# Patient Record
Sex: Female | Born: 1990 | Race: Black or African American | Hispanic: No | Marital: Single | Smoking: Former smoker
Health system: Southern US, Community
[De-identification: ages and names within clinical notes are randomized; demographics above are authoritative.]

## PROBLEM LIST (undated history)

## (undated) DIAGNOSIS — R112 Nausea with vomiting, unspecified: Secondary | ICD-10-CM

## (undated) DIAGNOSIS — F1290 Cannabis use, unspecified, uncomplicated: Secondary | ICD-10-CM

## (undated) DIAGNOSIS — R1116 Cannabis hyperemesis syndrome: Secondary | ICD-10-CM

## (undated) DIAGNOSIS — R109 Unspecified abdominal pain: Secondary | ICD-10-CM

## (undated) HISTORY — PX: WISDOM TOOTH EXTRACTION: SHX21

---
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
## 2010-05-02 LAB — BASIC METABOLIC PANEL
Calcium: 8.8 mg/dL (ref 8.4–10.5)
Glucose, Bld: 96 mg/dL (ref 70–99)
Sodium: 142 mEq/L (ref 135–145)

## 2010-05-02 LAB — URINALYSIS, ROUTINE W REFLEX MICROSCOPIC
Glucose, UA: NEGATIVE mg/dL
Ketones, ur: 15 mg/dL — AB
pH: 6 (ref 5.0–8.0)

## 2010-05-02 LAB — URINE MICROSCOPIC-ADD ON

## 2010-05-04 LAB — URINE CULTURE

## 2010-05-04 LAB — URINALYSIS, ROUTINE W REFLEX MICROSCOPIC
Glucose, UA: NEGATIVE mg/dL
Hgb urine dipstick: NEGATIVE
Hgb urine dipstick: NEGATIVE
Ketones, ur: 15 mg/dL — AB
Protein, ur: NEGATIVE mg/dL
Specific Gravity, Urine: 1.029 (ref 1.005–1.030)
Urobilinogen, UA: 0.2 mg/dL (ref 0.0–1.0)

## 2010-05-04 LAB — COMPREHENSIVE METABOLIC PANEL
AST: 57 U/L — ABNORMAL HIGH (ref 0–37)
Albumin: 4.9 g/dL (ref 3.5–5.2)
Calcium: 9 mg/dL (ref 8.4–10.5)
Chloride: 106 mEq/L (ref 96–112)
Creatinine, Ser: 0.8 mg/dL (ref 0.4–1.2)
Total Protein: 9.5 g/dL — ABNORMAL HIGH (ref 6.0–8.3)

## 2010-05-04 LAB — CBC
MCV: 80.9 fL (ref 78.0–98.0)
Platelets: 188 10*3/uL (ref 150–400)
RDW: 12.3 % (ref 11.4–15.5)
WBC: 9.6 10*3/uL (ref 4.5–13.5)

## 2010-05-04 LAB — DIFFERENTIAL
Eosinophils Relative: 0 % (ref 0–5)
Lymphocytes Relative: 9 % — ABNORMAL LOW (ref 24–48)
Lymphs Abs: 0.9 10*3/uL — ABNORMAL LOW (ref 1.1–4.8)
Monocytes Absolute: 0.4 10*3/uL (ref 0.2–1.2)
Monocytes Relative: 4 % (ref 3–11)
Neutro Abs: 8.2 10*3/uL — ABNORMAL HIGH (ref 1.7–8.0)

## 2010-05-04 LAB — URINE MICROSCOPIC-ADD ON

## 2010-05-09 LAB — BASIC METABOLIC PANEL
BUN: 15 mg/dL (ref 6–23)
Chloride: 107 mEq/L (ref 96–112)
Glucose, Bld: 100 mg/dL — ABNORMAL HIGH (ref 70–99)
Potassium: 4.4 mEq/L (ref 3.5–5.1)
Sodium: 140 mEq/L (ref 135–145)

## 2010-05-09 LAB — URINALYSIS, ROUTINE W REFLEX MICROSCOPIC
Bilirubin Urine: NEGATIVE
Nitrite: NEGATIVE
Specific Gravity, Urine: 1.029 (ref 1.005–1.030)
Urobilinogen, UA: 0.2 mg/dL (ref 0.0–1.0)
pH: 5.5 (ref 5.0–8.0)

## 2010-05-09 LAB — PREGNANCY, URINE: Preg Test, Ur: NEGATIVE

## 2010-05-23 ENCOUNTER — Emergency Department (HOSPITAL_BASED_OUTPATIENT_CLINIC_OR_DEPARTMENT_OTHER)
Admission: EM | Admit: 2010-05-23 | Discharge: 2010-05-23 | Disposition: A | Discharge status: Home / Self Care | Payer: Self-pay | Attending: Emergency Medicine | Admitting: Emergency Medicine

## 2010-05-23 DIAGNOSIS — K089 Disorder of teeth and supporting structures, unspecified: Secondary | ICD-10-CM | POA: Insufficient documentation

## 2010-05-25 ENCOUNTER — Emergency Department (HOSPITAL_COMMUNITY)
Admission: EM | Admit: 2010-05-25 | Discharge: 2010-05-25 | Disposition: A | Discharge status: Home / Self Care | Payer: Self-pay | Attending: Emergency Medicine | Admitting: Emergency Medicine

## 2010-05-25 DIAGNOSIS — K089 Disorder of teeth and supporting structures, unspecified: Secondary | ICD-10-CM | POA: Insufficient documentation

## 2010-05-25 DIAGNOSIS — F329 Major depressive disorder, single episode, unspecified: Secondary | ICD-10-CM | POA: Insufficient documentation

## 2010-05-25 DIAGNOSIS — K029 Dental caries, unspecified: Secondary | ICD-10-CM | POA: Insufficient documentation

## 2010-05-25 DIAGNOSIS — F3289 Other specified depressive episodes: Secondary | ICD-10-CM | POA: Insufficient documentation

## 2010-06-12 ENCOUNTER — Emergency Department (HOSPITAL_BASED_OUTPATIENT_CLINIC_OR_DEPARTMENT_OTHER)
Admission: EM | Admit: 2010-06-12 | Discharge: 2010-06-13 | Disposition: A | Discharge status: Home / Self Care | Payer: Self-pay | Attending: Emergency Medicine | Admitting: Emergency Medicine

## 2010-06-12 ENCOUNTER — Emergency Department (HOSPITAL_BASED_OUTPATIENT_CLINIC_OR_DEPARTMENT_OTHER)
Admission: EM | Admit: 2010-06-12 | Discharge: 2010-06-12 | Disposition: A | Discharge status: Home / Self Care | Payer: Self-pay | Attending: Emergency Medicine | Admitting: Emergency Medicine

## 2010-06-12 ENCOUNTER — Emergency Department (INDEPENDENT_AMBULATORY_CARE_PROVIDER_SITE_OTHER): Payer: Self-pay

## 2010-06-12 DIAGNOSIS — J3489 Other specified disorders of nose and nasal sinuses: Secondary | ICD-10-CM | POA: Insufficient documentation

## 2010-06-12 DIAGNOSIS — J189 Pneumonia, unspecified organism: Secondary | ICD-10-CM | POA: Insufficient documentation

## 2010-06-12 DIAGNOSIS — J069 Acute upper respiratory infection, unspecified: Secondary | ICD-10-CM | POA: Insufficient documentation

## 2010-06-12 DIAGNOSIS — R0789 Other chest pain: Secondary | ICD-10-CM

## 2010-06-12 DIAGNOSIS — R05 Cough: Secondary | ICD-10-CM

## 2010-06-12 LAB — RAPID STREP SCREEN (MED CTR MEBANE ONLY): Streptococcus, Group A Screen (Direct): NEGATIVE

## 2010-10-13 LAB — INFLUENZA A AND B ANTIGEN (CONVERTED LAB): Inflenza A Ag: NEGATIVE

## 2011-02-05 ENCOUNTER — Encounter (HOSPITAL_COMMUNITY): Payer: Self-pay | Admitting: Adult Health

## 2011-02-05 ENCOUNTER — Emergency Department (HOSPITAL_COMMUNITY)
Admission: EM | Admit: 2011-02-05 | Discharge: 2011-02-05 | Disposition: A | Discharge status: Home / Self Care | Payer: Self-pay | Attending: Emergency Medicine | Admitting: Emergency Medicine

## 2011-02-05 DIAGNOSIS — K089 Disorder of teeth and supporting structures, unspecified: Secondary | ICD-10-CM | POA: Insufficient documentation

## 2011-02-05 DIAGNOSIS — K047 Periapical abscess without sinus: Secondary | ICD-10-CM | POA: Insufficient documentation

## 2011-02-05 MED ORDER — PENICILLIN V POTASSIUM 500 MG PO TABS: 500.0000 mg | ORAL_TABLET | Freq: Four times a day (QID) | ORAL | Status: AC

## 2011-02-05 MED ORDER — HYDROCODONE-ACETAMINOPHEN 5-325 MG PO TABS: 1.0000 | ORAL_TABLET | ORAL | Status: AC | PRN

## 2011-02-05 NOTE — ED Notes (Signed)
D/c completed by A.Dennis,RN 

## 2011-02-05 NOTE — ED Notes (Signed)
C/o toothache that began one week ago and has gotten worse. Dental caries noted to upper right jaw.

## 2011-02-05 NOTE — ED Provider Notes (Signed)
History     CSN: [id number]  Arrival date & time 02/05/11  1712   First MD Initiated Contact with Patient 02/05/11 2057      Chief Complaint  Patient presents with  . Dental Pain    (Consider location/radiation/quality/duration/timing/severity/associated sxs/prior treatment) Patient is a 21 y.o. female presenting with tooth pain. The history is provided by the patient.  Dental PainThe primary symptoms include mouth pain and oral bleeding. Primary symptoms do not include dental injury, oral lesions, headaches, fever, shortness of breath, sore throat, angioedema or cough. The symptoms began 5 to 7 days ago. The symptoms are worsening. The symptoms are new. The symptoms occur constantly.  Oral bleeding began 24 - 48 hours ago. Progression of oral bleeding: resolved. The bleeding is new. Location of the bleeding: gum(s). The bleeding is associated with brushing teeth.  Additional symptoms include: dental sensitivity to temperature and gum tenderness. Additional symptoms do not include: purulent gums, trismus, facial swelling, trouble swallowing, drooling, ear pain, hearing loss and swollen glands. Medical issues include: smoking.    History reviewed. No pertinent past medical history.  History reviewed. No pertinent past surgical history.  History reviewed. No pertinent family history.  History  Substance Use Topics  . Smoking status: Current Everyday Smoker  . Smokeless tobacco: Not on file  . Alcohol Use: No     Review of Systems  Constitutional: Negative for fever and chills.  HENT: Positive for dental problem. Negative for hearing loss, ear pain, sore throat, facial swelling, drooling and trouble swallowing.   Eyes: Negative for pain and visual disturbance.  Respiratory: Negative for cough and shortness of breath.   Gastrointestinal: Negative for nausea, vomiting and abdominal pain.  Neurological: Negative for dizziness, light-headedness and headaches.    Allergies    Ibuprofen and Zofran  Home Medications  No current outpatient prescriptions on file.  BP 125/69  Pulse 74  Temp(Src) 98.6 F (37 C) (Oral)  Resp 20  SpO2 100%  Physical Exam  Nursing note and vitals reviewed. Constitutional: [name] oriented to person, place, and time. She appears well-developed and well-nourished.       Uncomfortable appearing  HENT:  Head: Normocephalic and atraumatic. No trismus in the jaw.  Right Ear: External ear normal.  Left Ear: External ear normal.  Nose: Nose normal.  Mouth/Throat: No oral lesions. Dental caries present.         Erosion to multiple teeth with significant plaque  Eyes: Conjunctivae are normal. Pupils are equal, round, and reactive to light.  Neck: Normal range of motion. Neck supple.  Cardiovascular: Normal rate and regular rhythm.   Pulmonary/Chest: Effort normal. No respiratory distress.  Abdominal: Soft. She exhibits no distension. There is no tenderness.  Musculoskeletal: She exhibits no edema and no tenderness.  Lymphadenopathy:    She has no cervical adenopathy.  Neurological: [name] alert and oriented to person, place, and time. No cranial nerve deficit.  Skin: Skin is warm and dry. No erythema.  Psychiatric: She has a normal mood and affect.    ED Course  Procedures (including critical care time)  Labs Reviewed - No data to display No results found.  Dx 1: Dental abscess   MDM  Suspect dental abscess. Will give rx for PCN and pain medication- advised close f/u with dentist and pt voices understanding of need for follow-up.        Elwyn Reach [place], [place] 02/05/11 2133

## 2011-02-06 ENCOUNTER — Encounter (HOSPITAL_COMMUNITY): Payer: Self-pay | Admitting: *Deleted

## 2011-02-06 ENCOUNTER — Emergency Department (HOSPITAL_COMMUNITY)
Admission: EM | Admit: 2011-02-06 | Discharge: 2011-02-07 | Disposition: A | Discharge status: Home / Self Care | Payer: Self-pay | Attending: Emergency Medicine | Admitting: Emergency Medicine

## 2011-02-06 DIAGNOSIS — N898 Other specified noninflammatory disorders of vagina: Secondary | ICD-10-CM | POA: Insufficient documentation

## 2011-02-06 DIAGNOSIS — B86 Scabies: Secondary | ICD-10-CM | POA: Insufficient documentation

## 2011-02-06 LAB — URINALYSIS, ROUTINE W REFLEX MICROSCOPIC
Hgb urine dipstick: NEGATIVE
Nitrite: NEGATIVE
Specific Gravity, Urine: 1.024 (ref 1.005–1.030)
Urobilinogen, UA: 1 mg/dL (ref 0.0–1.0)

## 2011-02-06 LAB — WET PREP, GENITAL
Clue Cells Wet Prep HPF POC: NONE SEEN
WBC, Wet Prep HPF POC: NONE SEEN

## 2011-02-06 LAB — URINE MICROSCOPIC-ADD ON

## 2011-02-06 MED ORDER — PERMETHRIN 5 % EX CREA: TOPICAL_CREAM | CUTANEOUS | Status: AC

## 2011-02-06 NOTE — ED Notes (Signed)
Pt in stating she thinks she has scabies, boyfriend was dx with this one month ago and pt never got treated, also wants to be evaluated for STD's, c/o brown vaginal discharge, recently treated for another STD but she doesn't remember which one, also wants a pregnancy test

## 2011-02-06 NOTE — ED Provider Notes (Signed)
Medical screening examination/treatment/procedure(s) were performed by non-physician practitioner and as supervising physician I was immediately available for consultation/collaboration.   Hellena Pridgen M Zacchaeus Halm, DO 02/06/11 1306 

## 2011-02-06 NOTE — ED Notes (Signed)
PA at bedside.

## 2011-02-06 NOTE — ED Provider Notes (Signed)
History     CSN: [id number]  Arrival date & time 02/06/11  1804   First MD Initiated Contact with Patient 02/06/11 1949      Chief Complaint  Patient presents with  . SEXUALLY TRANSMITTED DISEASE  . Rash    (Consider location/radiation/quality/duration/timing/severity/associated sxs/prior treatment) HPI Comments: Patient here after her boyfriend reported burning with urination and burning and irritation from his penis - she states she had a brown white discharge but none now - denies abdominal pain, nausea, vomiting, urinary symptoms - also wants to be treated for scabies.  Patient is a 21 y.o. female presenting with rash and vaginal discharge. The history is provided by the patient. No language interpreter was used.  Rash  This is a new problem. The current episode started more than 1 week ago. The problem has not changed since onset.Associated with: contact with boyfriend who was recently diagnosed with scabies. There has been no fever. The rash is present on the torso, back, abdomen and groin. The pain is at a severity of 0/10. The patient is experiencing no pain. The pain has been constant since onset. Associated symptoms include itching. Pertinent negatives include no pain and no weeping. She has tried nothing for the symptoms. The treatment provided no relief.  Vaginal Discharge This is a new problem. The current episode started in the past 7 days. The problem occurs intermittently. The problem has been unchanged. Associated symptoms include a rash. Pertinent negatives include no abdominal pain, anorexia, chest pain, congestion, coughing, fatigue, fever, headaches, joint swelling, nausea, sore throat, swollen glands, urinary symptoms, vomiting or weakness. The symptoms are aggravated by nothing. She has tried nothing for the symptoms. The treatment provided no relief.    History reviewed. No pertinent past medical history.  History reviewed. No pertinent past surgical  history.  History reviewed. No pertinent family history.  History  Substance Use Topics  . Smoking status: Current Everyday Smoker  . Smokeless tobacco: Not on file  . Alcohol Use: No    OB History    Grav Para Term Preterm Abortions TAB SAB Ect Mult Living                  Review of Systems  Constitutional: Negative for fever and fatigue.  HENT: Negative for congestion and sore throat.   Respiratory: Negative for cough.   Cardiovascular: Negative for chest pain.  Gastrointestinal: Negative for nausea, vomiting, abdominal pain and anorexia.  Genitourinary: Positive for vaginal discharge.  Musculoskeletal: Negative for joint swelling.  Skin: Positive for itching and rash.  Neurological: Negative for weakness and headaches.  All other systems reviewed and are negative.    Allergies  Ibuprofen and Zofran  Home Medications   Current Outpatient Rx  Name Route Sig Dispense Refill  . HYDROCODONE-ACETAMINOPHEN 5-325 MG PO TABS Oral Take 1-2 tablets by mouth every 4 (four) hours as needed for pain. 15 tablet 0  . PENICILLIN V POTASSIUM 500 MG PO TABS Oral Take 1 tablet (500 mg total) by mouth 4 (four) times daily. 40 tablet 0    BP 97/48  Pulse 69  Temp(Src) 98.3 F (36.8 C) (Oral)  Resp 18  SpO2 100%  Physical Exam  Nursing note and vitals reviewed. Constitutional: [name] oriented to person, place, and time. She appears well-developed and well-nourished. No distress.  HENT:  Head: Normocephalic and atraumatic.  Right Ear: External ear normal.  Left Ear: External ear normal.  Nose: Nose normal.  Mouth/Throat: Oropharynx is clear and moist.  No oropharyngeal exudate.  Eyes: Conjunctivae are normal. Pupils are equal, round, and reactive to light. No scleral icterus.  Neck: Normal range of motion. Neck supple.  Cardiovascular: Normal rate, regular rhythm and normal heart sounds.  Exam reveals no gallop and no friction rub.   No murmur heard. Pulmonary/Chest: Effort  normal and breath sounds normal. She exhibits no tenderness.  Abdominal: Soft. Bowel sounds are normal. She exhibits no distension. There is no tenderness.  Genitourinary: Uterus normal. There is no rash or tenderness on the right labia. There is no rash or tenderness on the left labia. Uterus is not enlarged and not tender. Cervix exhibits no motion tenderness, no discharge and no friability. Right adnexum displays no mass and no tenderness. Left adnexum displays no mass and no tenderness. No bleeding around the vagina. No vaginal discharge found.  Musculoskeletal: Normal range of motion.  Lymphadenopathy:    She has no cervical adenopathy.  Neurological: [name] alert and oriented to person, place, and time. No cranial nerve deficit.  Skin: Rash noted.       Diffuse papular rash to trunk, abdomen, groin and back  Psychiatric: She has a normal mood and affect. Her behavior is normal. Judgment and thought content normal.    ED Course  Procedures (including critical care time)  Labs Reviewed  URINALYSIS, ROUTINE W REFLEX MICROSCOPIC - Abnormal; Notable for the following:    APPearance CLOUDY (*)    Leukocytes, UA SMALL (*)    All other components within normal limits  URINE MICROSCOPIC-ADD ON - Abnormal; Notable for the following:    Squamous Epithelial / LPF MANY (*)    Bacteria, UA MANY (*)    All other components within normal limits  WET PREP, GENITAL  PREGNANCY, URINE  GC/CHLAMYDIA PROBE AMP, GENITAL  POCT PREGNANCY, URINE  PREGNANCY, URINE   No results found.   Scabies STD check    MDM  Patient here with history of scabies in close contact so will treat - though boyfriend with dysuria and irritation to penis - there is no evidence clinically of cervicitis - have sent cultures - wet prep negative, non purulent discharge.       Izola Price [place], [place] 02/06/11 2251

## 2011-02-07 LAB — GC/CHLAMYDIA PROBE AMP, GENITAL: GC Probe Amp, Genital: NEGATIVE

## 2011-02-07 NOTE — ED Provider Notes (Signed)
Medical screening examination/treatment/procedure(s) were performed by non-physician practitioner and as supervising physician I was immediately available for consultation/collaboration. Devoria Albe, MD, Armando Gang   Ward Givens, MD 02/07/11 0040

## 2011-02-08 NOTE — ED Notes (Signed)
+   Chlamydia. Patient was not treated appropriately.Chart sent to EDP office for review. [employer] attached.

## 2011-02-09 NOTE — ED Notes (Signed)
Treated with  Doxycyline 100 mg po BID x 7 days per [facility] # 14 need to be called to pharmacy.

## 2011-02-09 NOTE — ED Notes (Signed)
RX call in at [facility] pharmacy on [address]

## 2012-01-31 ENCOUNTER — Emergency Department (HOSPITAL_COMMUNITY)
Admission: EM | Admit: 2012-01-31 | Discharge: 2012-01-31 | Disposition: A | Discharge status: Home / Self Care | Payer: Self-pay | Attending: Emergency Medicine | Admitting: Emergency Medicine

## 2012-01-31 ENCOUNTER — Encounter (HOSPITAL_COMMUNITY): Payer: Self-pay | Admitting: *Deleted

## 2012-01-31 DIAGNOSIS — Y929 Unspecified place or not applicable: Secondary | ICD-10-CM | POA: Insufficient documentation

## 2012-01-31 DIAGNOSIS — Y939 Activity, unspecified: Secondary | ICD-10-CM | POA: Insufficient documentation

## 2012-01-31 DIAGNOSIS — W1809XA Striking against other object with subsequent fall, initial encounter: Secondary | ICD-10-CM | POA: Insufficient documentation

## 2012-01-31 DIAGNOSIS — F17200 Nicotine dependence, unspecified, uncomplicated: Secondary | ICD-10-CM | POA: Insufficient documentation

## 2012-01-31 DIAGNOSIS — S0180XA Unspecified open wound of other part of head, initial encounter: Secondary | ICD-10-CM | POA: Insufficient documentation

## 2012-01-31 DIAGNOSIS — S0181XA Laceration without foreign body of other part of head, initial encounter: Secondary | ICD-10-CM

## 2012-01-31 MED ORDER — ACETAMINOPHEN 500 MG PO TABS: 500.0000 mg | ORAL_TABLET | Freq: Four times a day (QID) | ORAL | Status: DC | PRN

## 2012-01-31 MED ORDER — ACETAMINOPHEN 325 MG PO TABS
650.0000 mg | ORAL_TABLET | Freq: Once | ORAL | Status: AC
  Administered 2012-01-31: 650 mg via ORAL
  Filled 2012-01-31: qty 2

## 2012-01-31 NOTE — ED Notes (Addendum)
Small laceration to right outer eyelid. Pt A&Ox4, ambulatory, nad. Applied ice to site.

## 2012-01-31 NOTE — ED Provider Notes (Signed)
Medical screening examination/treatment/procedure(s) were performed by non-physician practitioner and as supervising physician I was immediately available for consultation/collaboration.  Minsa Weddington R. Briseis Aguilera, MD 01/31/12 2332 

## 2012-01-31 NOTE — ED Provider Notes (Signed)
History     CSN: [id number]  Arrival date & time 01/31/12  2143   First MD Initiated Contact with Patient 01/31/12 2218      Chief Complaint  Patient presents with  . Facial Laceration   HPI  History provided by the patient. Patient is a 22 year old female with no significant PMH who presents with small laceration to right eyebrow. Patient states that she was reaching and sitting at her desk when a drawer in part of the desk broke and fell causing her to hit her right eyebrow area on the desk. She denies LOC. Denies any eye pain or vision change. Patient does have a small laceration over her eyebrow with bleeding that was controlled with pressure from wash cloth. Patient is not using other treatments for symptoms. She denies any other associated symptoms or injury. Pain is described as moderate and throbbing. Patient reports having a tetanus shot within the last 6 months.    History reviewed. No pertinent past medical history.  Past Surgical History  Procedure Date  . Wisdom tooth extraction     History reviewed. No pertinent family history.  History  Substance Use Topics  . Smoking status: Current Every Day Smoker -- 0.5 packs/day  . Smokeless tobacco: Not on file  . Alcohol Use: No    OB History    Grav Para Term Preterm Abortions TAB SAB Ect Mult Living                  Review of Systems  All other systems reviewed and are negative.    Allergies  Ibuprofen and Zofran  Home Medications  No current outpatient prescriptions on file.  BP 151/78  Pulse 105  Temp 99.5 F (37.5 C) (Oral)  SpO2 100%  Physical Exam  Nursing note and vitals reviewed. Constitutional: [name] oriented to person, place, and time. She appears well-developed and well-nourished. No distress.  HENT:  Head: Normocephalic.       1.5 cm linear laceration to the right eyebrow with small underlying hematoma. Mild tenderness to palpation. No step-offs. No raccoon eyes or battle sign. No other  head injuries.  Eyes: Conjunctivae normal and EOM are normal. Pupils are equal, round, and reactive to light.  Neck: Normal range of motion. Neck supple.       No cervical midline tenderness  Cardiovascular: Normal rate and regular rhythm.   Pulmonary/Chest: Effort normal and breath sounds normal.  Neurological: [name] alert and oriented to person, place, and time.  Skin: Skin is warm and dry.  Psychiatric: She has a normal mood and affect. Her behavior is normal.    ED Course  Procedures  LACERATION REPAIR Performed by: Angus Seller Authorized by: Angus Seller Consent: Verbal consent obtained. Risks and benefits: risks, benefits and alternatives were discussed Consent given by: patient Patient identity confirmed: provided demographic data Prepped and Draped in normal sterile fashion Wound explored  Laceration Location: Right eyebrow  Laceration Length: 1.5 cm  No Foreign Bodies seen or palpated  Anesthesia: None   Irrigation method: syringe Amount of cleaning: standard  Skin closure: Skin with Dermabond   Patient tolerance: Patient tolerated the procedure well with no immediate complications.      1. Laceration of face       MDM  10:20PM patient seen and evaluated. Patient in no acute distress and well-appearing.        Angus Seller, [place] 01/31/12 2248

## 2012-01-31 NOTE — ED Notes (Addendum)
Pt states she was leaning on her wooden desk when it "fell in".  Knocked her to the floor and has laceration above right eye.  Did not lose consciousness.  No blurred vision or vision changes

## 2012-05-01 ENCOUNTER — Encounter (HOSPITAL_BASED_OUTPATIENT_CLINIC_OR_DEPARTMENT_OTHER): Payer: Self-pay

## 2012-05-01 ENCOUNTER — Emergency Department (HOSPITAL_BASED_OUTPATIENT_CLINIC_OR_DEPARTMENT_OTHER)
Admission: EM | Admit: 2012-05-01 | Discharge: 2012-05-01 | Disposition: A | Discharge status: Home / Self Care | Payer: Medicaid Other | Attending: Emergency Medicine | Admitting: Emergency Medicine

## 2012-05-01 ENCOUNTER — Emergency Department (HOSPITAL_BASED_OUTPATIENT_CLINIC_OR_DEPARTMENT_OTHER): Payer: Medicaid Other

## 2012-05-01 ENCOUNTER — Emergency Department (HOSPITAL_COMMUNITY)
Admission: EM | Admit: 2012-05-01 | Discharge: 2012-05-02 | Discharge status: Against Medical Advice | Payer: Medicaid Other | Source: Home / Self Care

## 2012-05-01 ENCOUNTER — Encounter (HOSPITAL_COMMUNITY): Payer: Self-pay | Admitting: *Deleted

## 2012-05-01 DIAGNOSIS — R109 Unspecified abdominal pain: Secondary | ICD-10-CM | POA: Insufficient documentation

## 2012-05-01 DIAGNOSIS — A499 Bacterial infection, unspecified: Secondary | ICD-10-CM | POA: Insufficient documentation

## 2012-05-01 DIAGNOSIS — B9689 Other specified bacterial agents as the cause of diseases classified elsewhere: Secondary | ICD-10-CM | POA: Insufficient documentation

## 2012-05-01 DIAGNOSIS — IMO0002 Reserved for concepts with insufficient information to code with codable children: Secondary | ICD-10-CM | POA: Insufficient documentation

## 2012-05-01 DIAGNOSIS — R1110 Vomiting, unspecified: Secondary | ICD-10-CM | POA: Insufficient documentation

## 2012-05-01 DIAGNOSIS — Z3202 Encounter for pregnancy test, result negative: Secondary | ICD-10-CM | POA: Insufficient documentation

## 2012-05-01 DIAGNOSIS — N760 Acute vaginitis: Secondary | ICD-10-CM | POA: Insufficient documentation

## 2012-05-01 DIAGNOSIS — R1084 Generalized abdominal pain: Secondary | ICD-10-CM | POA: Insufficient documentation

## 2012-05-01 DIAGNOSIS — R112 Nausea with vomiting, unspecified: Secondary | ICD-10-CM | POA: Insufficient documentation

## 2012-05-01 DIAGNOSIS — Z87891 Personal history of nicotine dependence: Secondary | ICD-10-CM | POA: Insufficient documentation

## 2012-05-01 LAB — COMPREHENSIVE METABOLIC PANEL
ALT: 14 U/L (ref 0–35)
AST: 16 U/L (ref 0–37)
Alkaline Phosphatase: 64 U/L (ref 39–117)
CO2: 22 mEq/L (ref 19–32)
Calcium: 9.3 mg/dL (ref 8.4–10.5)
GFR calc non Af Amer: >90 mL/min (ref >90)
Glucose, Bld: 142 mg/dL — ABNORMAL HIGH (ref 70–99)
Potassium: 3.7 mEq/L (ref 3.5–5.1)
Sodium: 137 mEq/L (ref 135–145)
Total Protein: 7.8 g/dL (ref 6.0–8.3)

## 2012-05-01 LAB — CBC WITH DIFFERENTIAL/PLATELET
Basophils Absolute: 0 10*3/uL (ref 0.0–0.1)
Eosinophils Relative: 0 % (ref 0–5)
HCT: 41.9 % (ref 36.0–46.0)
Hemoglobin: 14.1 g/dL (ref 12.0–15.0)
Lymphocytes Relative: 19 % (ref 12–46)
Lymphs Abs: 2.2 10*3/uL (ref 0.7–4.0)
MCV: 80.6 fL (ref 78.0–100.0)
Neutrophils Relative %: 75 % (ref 43–77)
Platelets: 175 10*3/uL (ref 150–400)
RDW: 13.8 % (ref 11.5–15.5)

## 2012-05-01 LAB — URINALYSIS, ROUTINE W REFLEX MICROSCOPIC
Bilirubin Urine: NEGATIVE
Glucose, UA: NEGATIVE mg/dL
Hgb urine dipstick: NEGATIVE
Ketones, ur: 15 mg/dL — AB
Protein, ur: NEGATIVE mg/dL

## 2012-05-01 LAB — WET PREP, GENITAL: Trich, Wet Prep: NONE SEEN

## 2012-05-01 MED ORDER — HYDROCODONE-ACETAMINOPHEN 5-325 MG PO TABS: 1.0000 | ORAL_TABLET | ORAL | Status: DC | PRN

## 2012-05-01 MED ORDER — MORPHINE SULFATE 10 MG/ML IJ SOLN
10.0000 mg | Freq: Once | INTRAMUSCULAR | Status: AC
  Administered 2012-05-01: 10 mg via INTRAMUSCULAR
  Filled 2012-05-01: qty 1

## 2012-05-01 MED ORDER — CEFTRIAXONE SODIUM 250 MG IJ SOLR
250.0000 mg | Freq: Once | INTRAMUSCULAR | Status: AC
  Administered 2012-05-01: 250 mg via INTRAMUSCULAR
  Filled 2012-05-01: qty 250

## 2012-05-01 MED ORDER — LORAZEPAM 2 MG/ML IJ SOLN
1.0000 mg | Freq: Once | INTRAMUSCULAR | Status: AC
  Administered 2012-05-01: 1 mg via INTRAMUSCULAR
  Filled 2012-05-01: qty 1

## 2012-05-01 MED ORDER — AZITHROMYCIN 250 MG PO TABS
1000.0000 mg | ORAL_TABLET | Freq: Once | ORAL | Status: AC
  Administered 2012-05-01: 1000 mg via ORAL
  Filled 2012-05-01: qty 4

## 2012-05-01 MED ORDER — ONDANSETRON 4 MG PO TBDP
ORAL_TABLET | ORAL | Status: AC
  Filled 2012-05-01: qty 1

## 2012-05-01 MED ORDER — METRONIDAZOLE 500 MG PO TABS: 500.0000 mg | ORAL_TABLET | Freq: Two times a day (BID) | ORAL | Status: DC

## 2012-05-01 NOTE — ED Provider Notes (Signed)
History     CSN: [id number]  Arrival date & time 05/01/12  1153   First MD Initiated Contact with Patient 05/01/12 1259      Chief Complaint  Patient presents with  . Emesis  . Abdominal Pain    (Consider location/radiation/quality/duration/timing/severity/associated sxs/prior treatment) HPI Comments: Patient is a 22 year old woman who was brought in complaining of abdominal pain. She says this came on around 9 AM. She has had vomiting. On arrival she was crying and calling out. Therefore she was seen stat upon arrival. She says she been a strong bowl yesterday, and wonders if that had something to do with her pain.  Patient is a 22 y.o. female presenting with vomiting and abdominal pain. The history is provided by the patient and medical records. No language interpreter was used.  Emesis Severity:  Severe Duration:  3 hours Quality:  Stomach contents Progression:  Worsening Chronicity:  New Relieved by:  Nothing Worsened by:  Nothing tried Associated symptoms: abdominal pain   Associated symptoms: no chills, no diarrhea and no fever   Associated symptoms comment:  She denies vaginal discharge. Abdominal Pain Associated symptoms: nausea and vomiting   Associated symptoms: no chills, no diarrhea, no dysuria, no fever and no vaginal discharge     History reviewed. No pertinent past medical history.  Past Surgical History  Procedure Laterality Date  . Wisdom tooth extraction      No family history on file.  History  Substance Use Topics  . Smoking status: Former Smoker -- 0.50 packs/day  . Smokeless tobacco: Not on file  . Alcohol Use: No    OB History   Grav Para Term Preterm Abortions TAB SAB Ect Mult Living                  Review of Systems  Constitutional: Negative for fever and chills.  HENT: Negative.   Eyes: Negative.   Respiratory: Negative.   Cardiovascular: Negative.   Gastrointestinal: Positive for nausea, vomiting and abdominal pain. Negative  for diarrhea.  Genitourinary: Negative for dysuria and vaginal discharge.  Musculoskeletal: Negative.   Skin: Negative.   Neurological: Negative.   Psychiatric/Behavioral:       Agitated, crying, calling out.    Allergies  Ibuprofen and Zofran  Home Medications   Current Outpatient Rx  Name  Route  Sig  Dispense  Refill  . acetaminophen (TYLENOL) 500 MG tablet   Oral   Take 1 tablet (500 mg total) by mouth every 6 (six) hours as needed for pain.   30 tablet   0     BP 141/86  Pulse 73  Temp(Src) 98.3 F (36.8 C) (Oral)  Resp 26  Ht 5\' 9"  (1.753 m)  Wt 320 lb (145.151 kg)  BMI 47.23 kg/m2  SpO2 100%  LMP 04/12/2012  Physical Exam  Nursing note and vitals reviewed. Constitutional:  Morbidly obese woman in acute distress, crying, complaining of abdominal pain.  HENT:  Head: Normocephalic and atraumatic.  Right Ear: External ear normal.  Left Ear: External ear normal.  Eyes: Conjunctivae and EOM are normal. Pupils are equal, round, and reactive to light.  Neck: Normal range of motion. Neck supple.  Cardiovascular: Normal rate, regular rhythm and normal heart sounds.   Pulmonary/Chest: Effort normal and breath sounds normal.  Abdominal: Soft.  She has diffuse abdominal pain but no mass rebound or rigidity.  Genitourinary:  She has a whitish. Old appearing discharge bimanual exam shows uterine tenderness but no apparent uterine  enlargement. She has mild bilateral adnexal tenderness but no mass.  Musculoskeletal: Normal range of motion. She exhibits tenderness. She exhibits no edema.  Skin: Skin is warm and dry.  Psychiatric:  Agitated, crying, calling out.    ED Course  Procedures (including critical care time)  Results for orders placed during the hospital encounter of 05/01/12  WET PREP, GENITAL      Result Value Range   Yeast Wet Prep HPF POC NONE SEEN  NONE SEEN   Trich, Wet Prep NONE SEEN  NONE SEEN   Clue Cells Wet Prep HPF POC FEW (*) NONE SEEN    WBC, Wet Prep HPF POC FEW (*) NONE SEEN  URINALYSIS, ROUTINE W REFLEX MICROSCOPIC      Result Value Range   Color, Urine YELLOW  YELLOW   APPearance CLOUDY (*) CLEAR   Specific Gravity, Urine 1.028  1.005 - 1.030   pH 7.0  5.0 - 8.0   Glucose, UA NEGATIVE  NEGATIVE mg/dL   Hgb urine dipstick NEGATIVE  NEGATIVE   Bilirubin Urine NEGATIVE  NEGATIVE   Ketones, ur 15 (*) NEGATIVE mg/dL   Protein, ur NEGATIVE  NEGATIVE mg/dL   Urobilinogen, UA 0.2  0.0 - 1.0 mg/dL   Nitrite NEGATIVE  NEGATIVE   Leukocytes, UA NEGATIVE  NEGATIVE  PREGNANCY, URINE      Result Value Range   Preg Test, Ur NEGATIVE  NEGATIVE  CBC WITH DIFFERENTIAL      Result Value Range   WBC 11.4 (*) 4.0 - 10.5 K/uL   RBC 5.20 (*) 3.87 - 5.11 MIL/uL   Hemoglobin 14.1  12.0 - 15.0 g/dL   HCT 16.1  09.6 - 04.5 %   MCV 80.6  78.0 - 100.0 fL   MCH 27.1  26.0 - 34.0 pg   MCHC 33.7  30.0 - 36.0 g/dL   RDW 40.9  81.1 - 91.4 %   Platelets 175  150 - 400 K/uL   Neutrophils Relative 75  43 - 77 %   Neutro Abs 8.6 (*) 1.7 - 7.7 K/uL   Lymphocytes Relative 19  12 - 46 %   Lymphs Abs 2.2  0.7 - 4.0 K/uL   Monocytes Relative 5  3 - 12 %   Monocytes Absolute 0.6  0.1 - 1.0 K/uL   Eosinophils Relative 0  0 - 5 %   Eosinophils Absolute 0.0  0.0 - 0.7 K/uL   Basophils Relative 0  0 - 1 %   Basophils Absolute 0.0  0.0 - 0.1 K/uL  COMPREHENSIVE METABOLIC PANEL      Result Value Range   Sodium 137  135 - 145 mEq/L   Potassium 3.7  3.5 - 5.1 mEq/L   Chloride 104  96 - 112 mEq/L   CO2 22  19 - 32 mEq/L   Glucose, Bld 142 (*) 70 - 99 mg/dL   BUN 17  6 - 23 mg/dL   Creatinine, Ser 7.82  0.50 - 1.10 mg/dL   Calcium 9.3  8.4 - 95.6 mg/dL   Total Protein 7.8  6.0 - 8.3 g/dL   Albumin 3.9  3.5 - 5.2 g/dL   AST 16  0 - 37 U/L   ALT 14  0 - 35 U/L   Alkaline Phosphatase 64  39 - 117 U/L   Total Bilirubin 0.3  0.3 - 1.2 mg/dL   GFR calc non Af Amer >90  >90 mL/min   GFR calc Af Amer >90  >90 mL/min  LIPASE, BLOOD      Result Value  Range   Lipase 17  11 - 59 U/L   US Abdomen Complete  05/01/2012  *RADIOLOGY REPORT*  Clinical Data:  Abdominal pain.  Nausea and vomiting.  COMPLETE ABDOMINAL ULTRASOUND  Comparison:  No priors.  Findings:  Gallbladder:  No shadowing gallstones or echogenic sludge.  No gallbladder wall thickening or pericholecystic fluid.  Negative sonographic Murphy's sign according to the ultrasound technologist.  Common bile duct:  Normal caliber measuring 2 mm in the porta hepatis.  Liver:  Normal size and echotexture without focal parenchymal abnormality.  Patent portal vein with hepatopetal flow.  IVC:  Poorly visualized secondary to overlying bowel gas.  Pancreas:  Poorly visualized secondary to overlying bowel gas.  Spleen:  Normal size and echotexture without focal parenchymal abnormality.5.6 cm in length.  Right Kidney:  No hydronephrosis.  Well-preserved cortex.  Normal size and parenchymal echotexture without focal abnormalities. 10.5 cm in length.  Left Kidney:  No hydronephrosis.  Well-preserved cortex.  Normal size and parenchymal echotexture without focal abnormalities. 10.8 cm in length.  Abdominal aorta:  Measures up to 2.3 cm in diameter proximally. Distal aorta is incompletely visualized secondary to overlying bowel gas.  IMPRESSION: 1.  No acute findings in the abdomen to account for patient's symptoms.   Original Report Authenticated By: Trudie Reed, M.D.     Lab workup showed only clue cells on vaginal discharge.  Will treat with metronidazole, for BV and hydrocodone-acetaminophen q4h prn pain.  Will also treat prospectively for STD as pt has prior documented history of Chlamydia.      1. Abdominal pain   2. Bacterial vaginosis        Carleene Cooper III, MD 05/01/12 [phone]

## 2012-05-01 NOTE — ED Notes (Signed)
Pt reports abdominal pian and vomiting that started this am at 0900

## 2012-05-01 NOTE — ED Notes (Signed)
Pt c/o abd pain; vomiting since 9am

## 2012-05-01 NOTE — ED Notes (Signed)
Patient transported to Ultrasound 

## 2012-05-01 NOTE — ED Notes (Signed)
Pt laying prone on stretcher , gown on floor , pt alert and oriented

## 2012-05-02 ENCOUNTER — Encounter (HOSPITAL_COMMUNITY): Payer: Self-pay | Admitting: Emergency Medicine

## 2012-05-02 ENCOUNTER — Emergency Department (HOSPITAL_COMMUNITY): Payer: Medicaid Other

## 2012-05-02 ENCOUNTER — Emergency Department (HOSPITAL_COMMUNITY)
Admission: EM | Admit: 2012-05-02 | Discharge: 2012-05-02 | Disposition: A | Discharge status: Home / Self Care | Payer: Medicaid Other | Attending: Emergency Medicine | Admitting: Emergency Medicine

## 2012-05-02 DIAGNOSIS — Z3202 Encounter for pregnancy test, result negative: Secondary | ICD-10-CM | POA: Insufficient documentation

## 2012-05-02 DIAGNOSIS — R6883 Chills (without fever): Secondary | ICD-10-CM | POA: Insufficient documentation

## 2012-05-02 DIAGNOSIS — R1084 Generalized abdominal pain: Secondary | ICD-10-CM | POA: Insufficient documentation

## 2012-05-02 DIAGNOSIS — R630 Anorexia: Secondary | ICD-10-CM | POA: Insufficient documentation

## 2012-05-02 DIAGNOSIS — R059 Cough, unspecified: Secondary | ICD-10-CM | POA: Insufficient documentation

## 2012-05-02 DIAGNOSIS — Z87891 Personal history of nicotine dependence: Secondary | ICD-10-CM | POA: Insufficient documentation

## 2012-05-02 DIAGNOSIS — R05 Cough: Secondary | ICD-10-CM | POA: Insufficient documentation

## 2012-05-02 DIAGNOSIS — R112 Nausea with vomiting, unspecified: Secondary | ICD-10-CM | POA: Insufficient documentation

## 2012-05-02 LAB — CBC WITH DIFFERENTIAL/PLATELET
Eosinophils Relative: 0 % (ref 0–5)
HCT: 42.2 % (ref 36.0–46.0)
Lymphocytes Relative: 16 % (ref 12–46)
Lymphs Abs: 1.9 10*3/uL (ref 0.7–4.0)
MCH: 26.9 pg (ref 26.0–34.0)
MCV: 77.1 fL — ABNORMAL LOW (ref 78.0–100.0)
Monocytes Absolute: 0.8 10*3/uL (ref 0.1–1.0)
Monocytes Relative: 7 % (ref 3–12)
RBC: 5.47 MIL/uL — ABNORMAL HIGH (ref 3.87–5.11)
WBC: 11.8 10*3/uL — ABNORMAL HIGH (ref 4.0–10.5)

## 2012-05-02 LAB — COMPREHENSIVE METABOLIC PANEL
ALT: 14 U/L (ref 0–35)
BUN: 11 mg/dL (ref 6–23)
CO2: 23 mEq/L (ref 19–32)
Calcium: 9.7 mg/dL (ref 8.4–10.5)
Creatinine, Ser: 0.66 mg/dL (ref 0.50–1.10)
GFR calc Af Amer: >90 mL/min (ref >90)
GFR calc non Af Amer: >90 mL/min (ref >90)
Glucose, Bld: 107 mg/dL — ABNORMAL HIGH (ref 70–99)

## 2012-05-02 LAB — URINALYSIS, ROUTINE W REFLEX MICROSCOPIC
Bilirubin Urine: NEGATIVE
Nitrite: NEGATIVE
Specific Gravity, Urine: 1.012 (ref 1.005–1.030)
Urobilinogen, UA: 0.2 mg/dL (ref 0.0–1.0)

## 2012-05-02 LAB — POCT PREGNANCY, URINE: Preg Test, Ur: NEGATIVE

## 2012-05-02 LAB — GC/CHLAMYDIA PROBE AMP
CT Probe RNA: NEGATIVE
GC Probe RNA: NEGATIVE

## 2012-05-02 MED ORDER — SODIUM CHLORIDE 0.9 % IV BOLUS (SEPSIS)
1000.0000 mL | Freq: Once | INTRAVENOUS | Status: AC
  Administered 2012-05-02: 1000 mL via INTRAVENOUS

## 2012-05-02 MED ORDER — ONDANSETRON HCL 4 MG/2ML IJ SOLN
INTRAMUSCULAR | Status: AC
  Filled 2012-05-02: qty 2

## 2012-05-02 MED ORDER — METOCLOPRAMIDE HCL 5 MG/ML IJ SOLN
10.0000 mg | Freq: Once | INTRAMUSCULAR | Status: AC
  Administered 2012-05-02: 10 mg via INTRAVENOUS
  Filled 2012-05-02: qty 2

## 2012-05-02 MED ORDER — HYDROMORPHONE HCL PF 1 MG/ML IJ SOLN
1.0000 mg | Freq: Once | INTRAMUSCULAR | Status: AC
  Administered 2012-05-02: 1 mg via INTRAVENOUS
  Filled 2012-05-02: qty 1

## 2012-05-02 MED ORDER — PROMETHAZINE HCL 25 MG RE SUPP: 25.0000 mg | Freq: Four times a day (QID) | RECTAL | Status: DC | PRN

## 2012-05-02 MED ORDER — METOCLOPRAMIDE HCL 5 MG/ML IJ SOLN
5.0000 mg | Freq: Once | INTRAMUSCULAR | Status: AC
  Administered 2012-05-02: 5 mg via INTRAVENOUS
  Filled 2012-05-02: qty 2

## 2012-05-02 MED ORDER — IOHEXOL 300 MG/ML  SOLN
100.0000 mL | Freq: Once | INTRAMUSCULAR | Status: AC | PRN
  Administered 2012-05-02: 100 mL via INTRAVENOUS

## 2012-05-02 MED ORDER — METOCLOPRAMIDE HCL 10 MG PO TABS: 10.0000 mg | ORAL_TABLET | Freq: Four times a day (QID) | ORAL | Status: DC | PRN

## 2012-05-02 MED ORDER — PROMETHAZINE HCL 25 MG/ML IJ SOLN
12.5000 mg | Freq: Once | INTRAMUSCULAR | Status: DC
  Filled 2012-05-02: qty 1

## 2012-05-02 MED ORDER — IOHEXOL 300 MG/ML  SOLN
25.0000 mL | INTRAMUSCULAR | Status: AC
  Administered 2012-05-02 (×2): 25 mL via ORAL

## 2012-05-02 MED ORDER — SODIUM CHLORIDE 0.9 % IV BOLUS (SEPSIS): 1000.0000 mL | Freq: Once | INTRAVENOUS | Status: DC

## 2012-05-02 NOTE — ED Notes (Signed)
Family member at desk requesting to speak with PA-C about pts d/c status

## 2012-05-02 NOTE — ED Notes (Signed)
Pt called out and in a loud harsh voice told RN to turn light off and shut door, while also using profanity.

## 2012-05-02 NOTE — ED Notes (Signed)
Pt c/o abdominal pain with n/v onset 2 days ago. Pt seen at [facility] yesterday for same. Pt given prescription for pain medication but unable to tolerate it. Pt reports do not have any medication for nausea.

## 2012-05-02 NOTE — ED Notes (Addendum)
Pt reported vomiting. No vomiting prior to this episode since pt arrival to ED witnessed by staff. Pt was given ice chips per PA-C. Upon entering room pt had filled cup with H2O. Clear liquid in floor, pt stating "I puked in your floor"

## 2012-05-02 NOTE — ED Provider Notes (Signed)
History     CSN: [id number]  Arrival date & time 05/02/12  1053   First MD Initiated Contact with Patient 05/02/12 1304      Chief Complaint  Patient presents with  . Emesis    (Consider location/radiation/quality/duration/timing/severity/associated sxs/prior treatment) HPI Comments: Patient with two days of lower abdominal pain, chills, N/V, decreased appetite.  Reports the pain is sharp, stabbing, and constant.  Vomiting initially was contents of stomach and is now bloody.  Pt was seen in ED for this 3 hours after the pain began, was diagnosed with BV.  Abdominal US was negative.  States she ate stromboli just prior to her symptoms beginning two days ago.  Denies change in bowel habits, no diarrhea or constipation.  Denies dysuria, frequency, or urgency.  Denies abnormal vaginal discharge or abnormal bleeding.  LMP was April 05 2012 was normal and on time.    The history is provided by the patient.    History reviewed. No pertinent past medical history.  Past Surgical History  Procedure Laterality Date  . Wisdom tooth extraction      No family history on file.  History  Substance Use Topics  . Smoking status: Former Smoker -- 0.50 packs/day  . Smokeless tobacco: Not on file  . Alcohol Use: No    OB History   Grav Para Term Preterm Abortions TAB SAB Ect Mult Living                  Review of Systems  Constitutional: Positive for chills and appetite change.  Respiratory: Positive for cough. Negative for shortness of breath.        Cough productive of clear sputum.   Cardiovascular: Negative for chest pain.  Gastrointestinal: Positive for nausea, vomiting and abdominal pain. Negative for diarrhea.  Genitourinary: Negative for dysuria, urgency, frequency, vaginal bleeding and vaginal discharge.    Allergies  Ibuprofen and Zofran  Home Medications   Current Outpatient Rx  Name  Route  Sig  Dispense  Refill  . acetaminophen (TYLENOL) 500 MG tablet   Oral   Take  1 tablet (500 mg total) by mouth every 6 (six) hours as needed for pain.   30 tablet   0   . HYDROcodone-acetaminophen (NORCO/VICODIN) 5-325 MG per tablet   Oral   Take 1 tablet by mouth every 4 (four) hours as needed for pain.   20 tablet   0   . metroNIDAZOLE (FLAGYL) 500 MG tablet   Oral   Take 1 tablet (500 mg total) by mouth 2 (two) times daily.   14 tablet   0     BP 135/68  Pulse 60  Temp(Src) 98.3 F (36.8 C) (Oral)  Resp 18  SpO2 100%  LMP 04/12/2012  Physical Exam  Nursing note and vitals reviewed. Constitutional: She appears well-developed and well-nourished. No distress.  HENT:  Head: Normocephalic and atraumatic.  Neck: Neck supple.  Cardiovascular: Normal rate and regular rhythm.   Pulmonary/Chest: Effort normal and breath sounds normal. No respiratory distress. She has no wheezes. She has no rales.  Abdominal: Soft. She exhibits no distension. There is tenderness. There is no rebound, no guarding and no CVA tenderness.  Diffuse tenderness, worse in RLQ  Neurological: [name] alert.  Skin: [name] not diaphoretic.    ED Course  Procedures (including critical care time)  Labs Reviewed  CBC WITH DIFFERENTIAL - Abnormal; Notable for the following:    WBC 11.8 (*)    RBC 5.47 (*)  MCV 77.1 (*)    Neutro Abs 9.1 (*)    All other components within normal limits  COMPREHENSIVE METABOLIC PANEL - Abnormal; Notable for the following:    Potassium 3.4 (*)    Glucose, Bld 107 (*)    All other components within normal limits  URINALYSIS, ROUTINE W REFLEX MICROSCOPIC - Abnormal; Notable for the following:    APPearance CLOUDY (*)    Ketones, ur >80 (*)    All other components within normal limits  LIPASE, BLOOD  POCT PREGNANCY, URINE   US Abdomen Complete  05/01/2012  *RADIOLOGY REPORT*  Clinical Data:  Abdominal pain.  Nausea and vomiting.  COMPLETE ABDOMINAL ULTRASOUND  Comparison:  No priors.  Findings:  Gallbladder:  No shadowing gallstones or  echogenic sludge.  No gallbladder wall thickening or pericholecystic fluid.  Negative sonographic Murphy's sign according to the ultrasound technologist.  Common bile duct:  Normal caliber measuring 2 mm in the porta hepatis.  Liver:  Normal size and echotexture without focal parenchymal abnormality.  Patent portal vein with hepatopetal flow.  IVC:  Poorly visualized secondary to overlying bowel gas.  Pancreas:  Poorly visualized secondary to overlying bowel gas.  Spleen:  Normal size and echotexture without focal parenchymal abnormality.5.6 cm in length.  Right Kidney:  No hydronephrosis.  Well-preserved cortex.  Normal size and parenchymal echotexture without focal abnormalities. 10.5 cm in length.  Left Kidney:  No hydronephrosis.  Well-preserved cortex.  Normal size and parenchymal echotexture without focal abnormalities. 10.8 cm in length.  Abdominal aorta:  Measures up to 2.3 cm in diameter proximally. Distal aorta is incompletely visualized secondary to overlying bowel gas.  IMPRESSION: 1.  No acute findings in the abdomen to account for patient's symptoms.   Original Report Authenticated By: Trudie Reed, M.D.    Ct Abdomen Pelvis W Contrast  05/02/2012  *RADIOLOGY REPORT*  Clinical Data: Abdominal pain, nausea and vomiting, starting 2 days ago.  CT ABDOMEN AND PELVIS WITH CONTRAST  Technique:  Multidetector CT imaging of the abdomen and pelvis was performed following the standard protocol during bolus administration of intravenous contrast.  Contrast: OMNIPAQUE IOHEXOL 300 MG/ML  SOLN  Comparison: 05/10/2008  Findings: The lung bases are clear.  Minimal motion artifact.  Adrenal glands, kidneys, spleen, pancreas, liver, and gallbladder are normal.  No ascites or lymphadenopathy.  No free air.  No radiopaque renal or ureteral calculus.  Trace free pelvic fluid is likely physiologic in this premenopausal female.  Uterus and ovaries are normal.  Bladder is normal.  No bowel wall thickening or focal  segmental dilatation.  The appendix is normal.  No acute osseous abnormality.  IMPRESSION: No acute intra-abdominal or pelvic pathology.   Original Report Authenticated By: Christiana Pellant, M.D.    3:43 PM Pt keeps falling asleep, not drinking contrast.  When awoken, states pain is "way down," now 9/10.    6:01 PM Pt is tolerating PO.  Has been sleeping during ED visit.  Nurse notes no vomiting since medications given.  Reexamination of abdomen is benign:  Abdomen is obese, nondistended, soft, diffusely tender to palpation, no guarding, no rebound.    1. Abdominal pain, diffuse   2. Nausea and vomiting     MDM  Pt with two days of abdominal pain, N/V, decreased appetite.  Pelvic performed at last visit on 05/01/12 showing only BV.  GC/Chlam are negative.  Labs, UA, abdomen CT unremarkable.  Repeat abdominal exam is benign.  Ketones in urine, likely dehydration.  IVF given in ED.  Tolerating PO.  Plan is for d/c home with nausea medications, have encouraged PO fluids at home.  Discussed all results with patient.  Pt given return precautions.  Pt verbalizes understanding and agrees with plan.           Trixie Dredge, PA-C 05/02/12 [phone]

## 2012-05-02 NOTE — ED Notes (Signed)
CT called, cups of oral contrast empty on bedside table near sink. PA-C aware

## 2012-05-02 NOTE — ED Notes (Signed)
PA-C in room at this time  

## 2012-05-02 NOTE — ED Notes (Signed)
Pt restless on stretcher, crying and repeating "somebody help me [employer]".  Pt vomited 2 times during assessment, with 1st episode bilious and blood-tinged and 2nd episode brown emesis.  PIV placed and IV fluids started with NS.

## 2012-05-02 NOTE — ED Notes (Signed)
Pt to CT

## 2012-05-02 NOTE — ED Notes (Signed)
Pt awoken again, had not drank any of PO contrast.  Pt instructed to sip 1st cup.

## 2012-05-02 NOTE — ED Notes (Addendum)
PA-C in room explaining to pt the need to finish oral contrast. Light was left on and door open by PA-C to keep pt awake and facilitate in drinking contrast.

## 2012-05-02 NOTE — ED Notes (Signed)
NAD noted at time of d/c home with family 

## 2012-05-02 NOTE — ED Notes (Signed)
Pt back from CT

## 2012-05-02 NOTE — ED Notes (Signed)
Pt sleeping, woke pt to remind her of oral contrast

## 2012-05-03 ENCOUNTER — Encounter (HOSPITAL_COMMUNITY): Payer: Self-pay | Admitting: *Deleted

## 2012-05-03 ENCOUNTER — Emergency Department (HOSPITAL_COMMUNITY)
Admission: EM | Admit: 2012-05-03 | Discharge: 2012-05-03 | Disposition: A | Discharge status: Home / Self Care | Payer: Medicaid Other | Attending: Emergency Medicine | Admitting: Emergency Medicine

## 2012-05-03 DIAGNOSIS — Z87898 Personal history of other specified conditions: Secondary | ICD-10-CM

## 2012-05-03 DIAGNOSIS — R197 Diarrhea, unspecified: Secondary | ICD-10-CM | POA: Insufficient documentation

## 2012-05-03 DIAGNOSIS — R112 Nausea with vomiting, unspecified: Secondary | ICD-10-CM | POA: Insufficient documentation

## 2012-05-03 DIAGNOSIS — R1084 Generalized abdominal pain: Secondary | ICD-10-CM | POA: Insufficient documentation

## 2012-05-03 DIAGNOSIS — Z87891 Personal history of nicotine dependence: Secondary | ICD-10-CM | POA: Insufficient documentation

## 2012-05-03 LAB — URINALYSIS, ROUTINE W REFLEX MICROSCOPIC
Glucose, UA: NEGATIVE mg/dL
Ketones, ur: >80 mg/dL — AB
Nitrite: NEGATIVE
pH: 6 (ref 5.0–8.0)

## 2012-05-03 LAB — URINE MICROSCOPIC-ADD ON

## 2012-05-03 LAB — POCT I-STAT, CHEM 8
HCT: 47 % — ABNORMAL HIGH (ref 36.0–46.0)
Hemoglobin: 16 g/dL — ABNORMAL HIGH (ref 12.0–15.0)
Sodium: 140 mEq/L (ref 135–145)
TCO2: 26 mmol/L (ref 0–100)

## 2012-05-03 LAB — RAPID URINE DRUG SCREEN, HOSP PERFORMED
Amphetamines: NOT DETECTED
Barbiturates: NOT DETECTED
Benzodiazepines: NOT DETECTED

## 2012-05-03 MED ORDER — PROMETHAZINE HCL 25 MG PO TABS: 25.0000 mg | ORAL_TABLET | Freq: Four times a day (QID) | ORAL | Status: DC | PRN

## 2012-05-03 MED ORDER — DICYCLOMINE HCL 10 MG/ML IM SOLN
20.0000 mg | Freq: Once | INTRAMUSCULAR | Status: AC
  Administered 2012-05-03: 20 mg via INTRAMUSCULAR
  Filled 2012-05-03: qty 4

## 2012-05-03 MED ORDER — PROMETHAZINE HCL 25 MG/ML IJ SOLN
25.0000 mg | Freq: Once | INTRAMUSCULAR | Status: AC
  Administered 2012-05-03: 25 mg via INTRAVENOUS
  Filled 2012-05-03: qty 1

## 2012-05-03 MED ORDER — LACTATED RINGERS IV BOLUS (SEPSIS)
1000.0000 mL | Freq: Once | INTRAVENOUS | Status: AC
  Administered 2012-05-03: 1000 mL via INTRAVENOUS

## 2012-05-03 MED ORDER — LOPERAMIDE HCL 2 MG PO CAPS: 2.0000 mg | ORAL_CAPSULE | Freq: Four times a day (QID) | ORAL | Status: DC | PRN

## 2012-05-03 NOTE — ED Notes (Signed)
Pt was seen at [facility] 05/02/12 for n/v/d. Pt back this am for same thing. Pt states she has vomitted x8. Pt with diarrhea x7.

## 2012-05-03 NOTE — ED Provider Notes (Signed)
History     CSN: [id number]  Arrival date & time 05/03/12  0215   First MD Initiated Contact with Patient 05/03/12 0221      Chief Complaint  Patient presents with  . Nausea  . Emesis  . Diarrhea  . Abdominal Pain    (Consider location/radiation/quality/duration/timing/severity/associated sxs/prior treatment) HPI Jacqueline Shepherd is a 22 y.o. female presents complaining about diffuse abdominal pain, nausea vomiting and diarrhea. Patient had a complete workup in the emergency department at [facility] yesterday afternoon. They were unable to find any emergent cause of this patient's pain, nausea or vomiting. She says her pain is currently moderate to severe, she continues to have vomiting and nausea despite her medicine has not been to keep her medicine down. Patient says she's been vomiting up the hydrocodone she was given in the ER.  Patient's pain is nonradiating, crampy, diffuse associated nausea vomiting or diarrhea. Patient denies alcohol tobacco or illicit drugs. I questioned the patient multiple times under use of THC as she is clearly smells like marijuana, she continues to deny it, she says she last smoked marijuana over 3 weeks ago.    History reviewed. No pertinent past medical history.  Past Surgical History  Procedure Laterality Date  . Wisdom tooth extraction      History reviewed. No pertinent family history.  History  Substance Use Topics  . Smoking status: Former Smoker -- 0.50 packs/day  . Smokeless tobacco: Not on file  . Alcohol Use: No    OB History   Grav Para Term Preterm Abortions TAB SAB Ect Mult Living                  Review of Systems At least 10pt or greater review of systems completed and are negative except where specified in the HPI.  Allergies  Ibuprofen and Zofran  Home Medications   Current Outpatient Rx  Name  Route  Sig  Dispense  Refill  . HYDROcodone-acetaminophen (NORCO/VICODIN) 5-325 MG per tablet   Oral   Take 1  tablet by mouth every 4 (four) hours as needed for pain.   20 tablet   0   . metroNIDAZOLE (FLAGYL) 500 MG tablet   Oral   Take 1 tablet (500 mg total) by mouth 2 (two) times daily.   14 tablet   0   . promethazine (PHENERGAN) 25 MG suppository   Rectal   Place 1 suppository (25 mg total) rectally every 6 (six) hours as needed for nausea.   12 each   0     BP 150/90  Pulse 88  Temp(Src) 98.9 F (37.2 C) (Oral)  Resp 18  SpO2 100%  LMP 04/12/2012  Physical Exam  Nursing notes reviewed.  Electronic medical record reviewed. VITAL SIGNS:   Filed Vitals:   05/03/12 0217 05/03/12 0545  BP: 142/81 150/90  Pulse: 78 88  Temp: 99.3 F (37.4 C) 98.9 F (37.2 C)  TempSrc: Oral Oral  Resp: 16 18  SpO2: 100% 100%   CONSTITUTIONAL: Awake, oriented times  x4, appears non-toxic, smells of marijuana HENT: Atraumatic, normocephalic, oral mucosa pink and moist, airway patent. Nares patent without drainage. External ears normal. EYES: Conjunctiva clear, EOMI, PERRLA NECK: Trachea midline, non-tender, supple CARDIOVASCULAR: Normal heart rate, Normal rhythm, No murmurs, rubs, gallops PULMONARY/CHEST: Clear to auscultation, no rhonchi, wheezes, or rales. Symmetrical breath sounds. Non-tender. ABDOMINAL: Non-distended, soft, obese, exaggerated response to exam, when the patient is distracted she is nontender, when she expects me  to touch her she jumps and moans.  BS normal. NEUROLOGIC: Non-focal, moving all four extremities, no gross sensory or motor deficits. EXTREMITIES: No clubbing, cyanosis, or edema SKIN: Warm, Dry, No erythema, No rash  ED Course  Procedures (including critical care time)  Labs Reviewed  URINALYSIS, ROUTINE W REFLEX MICROSCOPIC - Abnormal; Notable for the following:    APPearance CLOUDY (*)    Specific Gravity, Urine >1.046 (*)    Bilirubin Urine SMALL (*)    Ketones, ur >80 (*)    Protein, ur 30 (*)    All other components within normal limits  URINE  RAPID DRUG SCREEN (HOSP PERFORMED) - Abnormal; Notable for the following:    Tetrahydrocannabinol POSITIVE (*)    All other components within normal limits  URINE MICROSCOPIC-ADD ON - Abnormal; Notable for the following:    Squamous Epithelial / LPF MANY (*)    Bacteria, UA FEW (*)    All other components within normal limits  POCT I-STAT, CHEM 8 - Abnormal; Notable for the following:    Potassium 3.3 (*)    Glucose, Bld 122 (*)    Calcium, Ion 1.08 (*)    Hemoglobin 16.0 (*)    HCT 47.0 (*)    All other components within normal limits   US Abdomen Complete  05/01/2012  *RADIOLOGY REPORT*  Clinical Data:  Abdominal pain.  Nausea and vomiting.  COMPLETE ABDOMINAL ULTRASOUND  Comparison:  No priors.  Findings:  Gallbladder:  No shadowing gallstones or echogenic sludge.  No gallbladder wall thickening or pericholecystic fluid.  Negative sonographic Murphy's sign according to the ultrasound technologist.  Common bile duct:  Normal caliber measuring 2 mm in the porta hepatis.  Liver:  Normal size and echotexture without focal parenchymal abnormality.  Patent portal vein with hepatopetal flow.  IVC:  Poorly visualized secondary to overlying bowel gas.  Pancreas:  Poorly visualized secondary to overlying bowel gas.  Spleen:  Normal size and echotexture without focal parenchymal abnormality.5.6 cm in length.  Right Kidney:  No hydronephrosis.  Well-preserved cortex.  Normal size and parenchymal echotexture without focal abnormalities. 10.5 cm in length.  Left Kidney:  No hydronephrosis.  Well-preserved cortex.  Normal size and parenchymal echotexture without focal abnormalities. 10.8 cm in length.  Abdominal aorta:  Measures up to 2.3 cm in diameter proximally. Distal aorta is incompletely visualized secondary to overlying bowel gas.  IMPRESSION: 1.  No acute findings in the abdomen to account for patient's symptoms.   Original Report Authenticated By: Trudie Reed, M.D.    Ct Abdomen Pelvis W  Contrast  05/02/2012  *RADIOLOGY REPORT*  Clinical Data: Abdominal pain, nausea and vomiting, starting 2 days ago.  CT ABDOMEN AND PELVIS WITH CONTRAST  Technique:  Multidetector CT imaging of the abdomen and pelvis was performed following the standard protocol during bolus administration of intravenous contrast.  Contrast: OMNIPAQUE IOHEXOL 300 MG/ML  SOLN  Comparison: 05/10/2008  Findings: The lung bases are clear.  Minimal motion artifact.  Adrenal glands, kidneys, spleen, pancreas, liver, and gallbladder are normal.  No ascites or lymphadenopathy.  No free air.  No radiopaque renal or ureteral calculus.  Trace free pelvic fluid is likely physiologic in this premenopausal female.  Uterus and ovaries are normal.  Bladder is normal.  No bowel wall thickening or focal segmental dilatation.  The appendix is normal.  No acute osseous abnormality.  IMPRESSION: No acute intra-abdominal or pelvic pathology.   Original Report Authenticated By: Christiana Pellant, M.D.  1. Diffuse abdominal pain   2. H/O nausea and vomiting   3. H/O diarrhea       MDM   Patient had an aggressive workup yesterday I do not think that is indicated to duplicate that workup, they repeated an i-STAT to make sure the patient is not too dehydrated, she does have positive ketones, do think she's having some vomiting and diarrhea. At this point, I do not think she's got an emergent cause of her nausea vomiting and diarrhea, she may have a gastroenteritis, and she still continues to smoke marijuana. Drug screen shows positive for THC, if she smokes 3 weeks ago I would not expect a positive result unless she were a chronic daily THC smoker.  We'll discharge the patient home with Phenergan which is different than what she was discharged with, we'll also discharge her home with some loperamide to stop her diarrhea. Based on the full workup that she's had, I do not think she's got any intra-abdominal emergency at this time, I doubt  appendicitis as developed in the last 24 hours, Intra-abdominal exam is benign-when she is distracted, she has no pain at all, when she knows that her belly is going to be pressed on she jumps and moans.  Discussed therapy at length with the patient and cessation of marijuana.  Return to the ER for worsening symptoms of dehydration or inability to keep down oral fluids, or any other concerning symptoms.      Jones Skene, MD 05/03/12 [phone]

## 2012-05-06 NOTE — ED Provider Notes (Signed)
History/physical exam/procedure(s) were performed by non-physician practitioner and as supervising physician I was immediately available for consultation/collaboration. I have reviewed all notes and am in agreement with care and plan.   Teondra Newburg S Verina Galeno, MD 05/06/12 1213 

## 2012-06-01 ENCOUNTER — Encounter (HOSPITAL_BASED_OUTPATIENT_CLINIC_OR_DEPARTMENT_OTHER): Payer: Self-pay | Admitting: *Deleted

## 2012-06-01 ENCOUNTER — Emergency Department (HOSPITAL_BASED_OUTPATIENT_CLINIC_OR_DEPARTMENT_OTHER)
Admission: EM | Admit: 2012-06-01 | Discharge: 2012-06-01 | Discharge status: Against Medical Advice | Payer: Medicaid Other | Attending: Emergency Medicine | Admitting: Emergency Medicine

## 2012-06-01 DIAGNOSIS — Z87891 Personal history of nicotine dependence: Secondary | ICD-10-CM | POA: Insufficient documentation

## 2012-06-01 DIAGNOSIS — R109 Unspecified abdominal pain: Secondary | ICD-10-CM | POA: Insufficient documentation

## 2012-06-01 DIAGNOSIS — G8929 Other chronic pain: Secondary | ICD-10-CM | POA: Insufficient documentation

## 2012-06-01 HISTORY — DX: Unspecified abdominal pain: R10.9

## 2012-06-01 NOTE — ED Notes (Signed)
Pt ambulated out of dept in hospital gown and wrapped in a blanket, NAD noted, steady gait.

## 2012-06-01 NOTE — ED Notes (Signed)
Pt taken to ED room, assited into bed, SR up x2. Pt demanding blankets and pillows and EDP. Advised pt of plan of care. NAD noted, resps even and unlabored, pt alert and oriented.

## 2012-06-01 NOTE — ED Notes (Signed)
Pt here for chronic lower abd pain, pt has been seen numerous times at various hospitals for same sxs. Pt states she has had pain intermittently since April. Pt alert and oriented, no vomiting noted, resps even and unlabored. Request for records faxed to [facility] since pt was just d/c'd from their facility after 12 hours in ED receiving numerous tests and studies.

## 2012-06-01 NOTE — ED Notes (Signed)
Pt walked over to the desk and asked for a pillow and a warm blanket, I got them for her.

## 2012-06-01 NOTE — ED Notes (Signed)
Pt in room going through cabinets, when asked pt what she needed she stated she was helping herself to what she needed. NAD noted, no vomiting noted.

## 2012-06-01 NOTE — ED Notes (Signed)
Pt yelling and laying in floor c/o abd pain. Pt was just d/c'd from [facility] for same complaints. Pt d/c'd home and then had pain again so returned to [facility] but care wasn't delivered fast enough per family so they left facility and drove to [facility]. Pt assisted into w/c and vital signs obtained.

## 2012-06-30 ENCOUNTER — Encounter (HOSPITAL_COMMUNITY): Payer: Self-pay | Admitting: *Deleted

## 2012-06-30 ENCOUNTER — Emergency Department (HOSPITAL_COMMUNITY)
Admission: EM | Admit: 2012-06-30 | Discharge: 2012-06-30 | Discharge status: Against Medical Advice | Payer: Medicaid Other | Attending: Emergency Medicine | Admitting: Emergency Medicine

## 2012-06-30 DIAGNOSIS — R112 Nausea with vomiting, unspecified: Secondary | ICD-10-CM | POA: Insufficient documentation

## 2012-06-30 NOTE — ED Notes (Signed)
Called pts name x3 for room assignment and no answer.

## 2012-06-30 NOTE — ED Notes (Signed)
Unable to locate pt  

## 2012-06-30 NOTE — ED Notes (Signed)
Reports onset approx 1 hour ago of generalized abd pain and n/v/d. No acute distress noted at this time.

## 2012-06-30 NOTE — ED Notes (Signed)
Unable to locate pt x1

## 2018-08-06 ENCOUNTER — Ambulatory Visit (INDEPENDENT_AMBULATORY_CARE_PROVIDER_SITE_OTHER): Payer: Medicaid Other | Admitting: Obstetrics

## 2018-08-06 ENCOUNTER — Encounter (HOSPITAL_COMMUNITY): Payer: Self-pay | Admitting: *Deleted

## 2018-08-06 ENCOUNTER — Other Ambulatory Visit: Payer: Self-pay

## 2018-08-06 ENCOUNTER — Encounter: Payer: Self-pay | Admitting: Obstetrics

## 2018-08-06 ENCOUNTER — Inpatient Hospital Stay (HOSPITAL_COMMUNITY)
Admission: EM | Admit: 2018-08-06 | Discharge: 2018-08-07 | Disposition: A | Discharge status: Home / Self Care | Payer: Medicaid Other | Attending: Obstetrics & Gynecology | Admitting: Obstetrics & Gynecology

## 2018-08-06 ENCOUNTER — Other Ambulatory Visit (HOSPITAL_COMMUNITY)
Admission: RE | Admit: 2018-08-06 | Discharge: 2018-08-06 | Disposition: A | Discharge status: Home / Self Care | Payer: Medicaid Other | Source: Ambulatory Visit | Attending: Obstetrics | Admitting: Obstetrics

## 2018-08-06 VITALS — BP 123/83 | HR 102 | Wt 332.4 lb

## 2018-08-06 DIAGNOSIS — R1030 Lower abdominal pain, unspecified: Secondary | ICD-10-CM | POA: Diagnosis present

## 2018-08-06 DIAGNOSIS — Z2839 Other underimmunization status: Secondary | ICD-10-CM

## 2018-08-06 DIAGNOSIS — R102 Pelvic and perineal pain: Secondary | ICD-10-CM | POA: Diagnosis not present

## 2018-08-06 DIAGNOSIS — Z3480 Encounter for supervision of other normal pregnancy, unspecified trimester: Secondary | ICD-10-CM | POA: Insufficient documentation

## 2018-08-06 DIAGNOSIS — R109 Unspecified abdominal pain: Secondary | ICD-10-CM | POA: Diagnosis not present

## 2018-08-06 DIAGNOSIS — O98812 Other maternal infectious and parasitic diseases complicating pregnancy, second trimester: Secondary | ICD-10-CM

## 2018-08-06 DIAGNOSIS — Z3482 Encounter for supervision of other normal pregnancy, second trimester: Secondary | ICD-10-CM | POA: Diagnosis not present

## 2018-08-06 DIAGNOSIS — O039 Complete or unspecified spontaneous abortion without complication: Secondary | ICD-10-CM | POA: Diagnosis not present

## 2018-08-06 DIAGNOSIS — O26899 Other specified pregnancy related conditions, unspecified trimester: Secondary | ICD-10-CM

## 2018-08-06 DIAGNOSIS — Z283 Underimmunization status: Secondary | ICD-10-CM

## 2018-08-06 DIAGNOSIS — A749 Chlamydial infection, unspecified: Secondary | ICD-10-CM

## 2018-08-06 DIAGNOSIS — Z87891 Personal history of nicotine dependence: Secondary | ICD-10-CM | POA: Diagnosis not present

## 2018-08-06 DIAGNOSIS — O9989 Other specified diseases and conditions complicating pregnancy, childbirth and the puerperium: Secondary | ICD-10-CM

## 2018-08-06 DIAGNOSIS — Z3A16 16 weeks gestation of pregnancy: Secondary | ICD-10-CM | POA: Diagnosis not present

## 2018-08-06 DIAGNOSIS — O26892 Other specified pregnancy related conditions, second trimester: Secondary | ICD-10-CM

## 2018-08-06 DIAGNOSIS — O219 Vomiting of pregnancy, unspecified: Secondary | ICD-10-CM

## 2018-08-06 DIAGNOSIS — O99212 Obesity complicating pregnancy, second trimester: Secondary | ICD-10-CM

## 2018-08-06 DIAGNOSIS — O99210 Obesity complicating pregnancy, unspecified trimester: Secondary | ICD-10-CM

## 2018-08-06 LAB — RAPID URINE DRUG SCREEN, HOSP PERFORMED
Amphetamines: NOT DETECTED
Barbiturates: NOT DETECTED
Benzodiazepines: NOT DETECTED
Cocaine: NOT DETECTED
Opiates: NOT DETECTED
Tetrahydrocannabinol: POSITIVE — AB

## 2018-08-06 MED ORDER — PROMETHAZINE HCL 25 MG/ML IJ SOLN
25.0000 mg | Freq: Once | INTRAMUSCULAR | Status: AC
  Administered 2018-08-07: 25 mg via INTRAVENOUS
  Filled 2018-08-06: qty 1

## 2018-08-06 MED ORDER — SODIUM CHLORIDE 0.9 % IV SOLN
INTRAVENOUS | Status: DC
  Administered 2018-08-07: via INTRAVENOUS

## 2018-08-06 MED ORDER — BLOOD PRESSURE KIT: PACK | 0 refills | Status: DC

## 2018-08-06 MED ORDER — HYDROMORPHONE HCL 1 MG/ML IJ SOLN: 1.0000 mg | Freq: Once | INTRAMUSCULAR | Status: DC

## 2018-08-06 NOTE — MAU Note (Signed)
Pt asked for water told her not until we know what's going on. Pt said ill drink from sink pt drank water and then proceded to vomit. Pt not keeping BP cuff on. Pt not laying down for FHR.

## 2018-08-06 NOTE — MAU Note (Addendum)
Pt reports to MAU c/o lower abdominal pain that is a 10/10. Pt reports vomiting x7 times. Pt reports vaginal bleeding that started today pt states its only when she wipes and its red and brown. Pt denies recent intercourse. Syphilis 3 weeks ago.

## 2018-08-06 NOTE — Progress Notes (Addendum)
Subjective:    Jacqueline Shepherd is being seen today for her first obstetrical visit.  This is not a planned pregnancy. [name] at [redacted]w[redacted]d gestation. Her obstetrical history is significant for obesity and chlamydia infection. Relationship with FOB: significant other, not living together. Patient does not intend to breast feed. Pregnancy history fully reviewed.  The information documented in the HPI was reviewed and verified.  Menstrual History: OB History    Gravida  1   Para      Term      Preterm      AB      Living        SAB      TAB      Ectopic      Multiple      Live Births               Patient's last menstrual period was 04/12/2018.    Past Medical History:  Diagnosis Date  . Abdominal pain     Past Surgical History:  Procedure Laterality Date  . WISDOM TOOTH EXTRACTION      (Not in a hospital admission)  Allergies  Allergen Reactions  . Ibuprofen Anaphylaxis  . Zofran Anaphylaxis    anaphylaxis    Social History   Tobacco Use  . Smoking status: Former Smoker    Packs/day: 0.50  . Smokeless tobacco: Never Used  Substance Use Topics  . Alcohol use: No    Family History  Problem Relation Age of Onset  . Diabetes Mother   . Hypertension Mother      Review of Systems Constitutional: negative for weight loss Gastrointestinal: positive for nausea, vomiting and abdominal pain Genitourinary:negative for genital lesions and vaginal discharge and dysuria Musculoskeletal:negative for back pain Behavioral/Psych: negative for abusive relationship, depression,  and tobacco use .    Objective:    BP 123/83   Pulse (!) 102   Wt (!) 332 lb 6.4 oz (150.8 kg)   LMP 04/12/2018   BMI 49.09 kg/m  General Appearance:    Alert, cooperative, no distress, appears stated age  Head:    Normocephalic, without obvious abnormality, atraumatic  Eyes:    PERRL, conjunctiva/corneas clear, EOM's intact, fundi    benign, both eyes  Ears:    Normal TM's and  external ear canals, both ears  Nose:   Nares normal, septum midline, mucosa normal, no drainage    or sinus tenderness  Throat:   Lips, mucosa, and tongue normal; teeth and gums normal  Neck:   Supple, symmetrical, trachea midline, no adenopathy;    thyroid:  no enlargement/tenderness/nodules; no carotid   bruit or JVD  Back:     Symmetric, no curvature, ROM normal, no CVA tenderness  Lungs:     Clear to auscultation bilaterally, respirations unlabored  Chest Wall:    No tenderness or deformity   Heart:    Regular rate and rhythm, S1 and S2 normal, no murmur, rub   or gallop  Breast Exam:    No tenderness, masses, or nipple abnormality  Abdomen:     Soft, tender, bowel sounds active all four quadrants,    no masses, no organomegaly  Genitalia:    Normal female without lesion, discharge or tenderness  Extremities:   Extremities normal, atraumatic, no cyanosis or edema  Pulses:   2+ and symmetric all extremities  Skin:   Skin color, texture, turgor normal, no rashes or lesions  Lymph nodes:   Cervical, supraclavicular, and   axillary nodes normal  Neurologic:   CNII-XII intact, normal strength, sensation and reflexes    throughout      Lab Review Urine pregnancy test   Labs reviewed yes Radiologic studies reviewed yes  Assessment:     1. Supervision of other normal pregnancy, antepartum Rx: - Obstetric Panel, Including HIV - Culture, OB Urine - Genetic Screening - Cervicovaginal ancillary only( [place]) - Cytology - PAP( [place]) - Blood Pressure KIT; Check BP at home regularly large cuff z34.80  Dispense: 1 kit; Refill: 0 - [place] MFM OB COMP + 14 WK; Future  2. Obesity affecting pregnancy, antepartum  3. Nausea and vomiting during pregnancy prior to [redacted] weeks gestation - patient sent to [facility] for evaluation   Plan:      Prenatal vitamins.  Counseling provided regarding continued use of seat belts, cessation of alcohol consumption, smoking or use of illicit drugs;  infection precautions i.e., influenza/TDAP immunizations, toxoplasmosis,CMV, parvovirus, listeria and varicella; workplace safety, exercise during pregnancy; routine dental care, safe medications, sexual activity, hot tubs, saunas, pools, travel, caffeine use, fish and methlymercury, potential toxins, hair treatments, varicose veins Weight gain recommendations per IOM guidelines reviewed: underweight/BMI< 18.5--> gain 28 - 40 lbs; normal weight/BMI 18.5 - 24.9--> gain 25 - 35 lbs; overweight/BMI 25 - 29.9--> gain 15 - 25 lbs; obese/BMI >30->gain  11 - 20 lbs Problem list reviewed and updated. FIRST/CF mutation testing/NIPT/QUAD SCREEN/fragile X/Ashkenazi Jewish population testing/Spinal muscular atrophy discussed: requested. Role of ultrasound in pregnancy discussed; fetal survey: requested. Amniocentesis discussed: not indicated.  Meds ordered this encounter  Medications  . Blood Pressure KIT    Sig: Check BP at home regularly large cuff z34.80    Dispense:  1 kit    Refill:  0   Orders Placed This Encounter  Procedures  . Culture, OB Urine  . [place] MFM OB COMP + 14 WK    Standing Status:   Future    Standing Expiration Date:   10/07/2019    Order Specific Question:   Reason for Exam (SYMPTOM  OR DIAGNOSIS REQUIRED)    Answer:   Anatomy.    Order Specific Question:   Preferred Location    Answer:   [facility]  . Obstetric Panel, Including HIV  . Genetic Screening    [place]    Follow up in 4 weeks. 50% of 25 min visit spent on counseling and coordination of care.    Shelly Bombard MD 08-06-2018

## 2018-08-06 NOTE — MAU Provider Note (Signed)
Chief Complaint:  Abdominal Pain, Vaginal Bleeding, and Emesis   First Provider Initiated Contact with Patient 08/06/18 2317    HPI: Jacqueline Shepherd is a 28 y.o. G1P0 at 78w4dho presents to maternity admissions reporting severe lower abdominal pain for two days.  Also has frequent vomiting for 2 days.  Has had a small amount of bleeding, only when wiping.  Patient is crying out, writhing, will not stay in bed.  Difficult to interview due to pain. . She denies LOF, vaginal itching/burning, urinary symptoms, h/a, dizziness, diarrhea, constipation or fever/chills.    Just seen in office at 1300 today for new OB.  Recently treated for Chlamydia 3 weeks ago. Told RN she also had syphilis.  [place] Unclear who treated her Chlamydia and Syphilis. Test of cure done in office  RN note: Pt reports to MAU c/o lower abdominal pain that is a 10/10. Pt reports vomiting x7 times. Pt reports vaginal bleeding that started today pt states its only when she wipes and its red and brown. Pt denies recent intercourse. Syphilis 3 weeks ago.   Past Medical History: Past Medical History:  Diagnosis Date  . Abdominal pain   . Medical history non-contributory     Past obstetric history: OB History  Gravida Para Term Preterm AB Living  1            SAB TAB Ectopic Multiple Live Births               # Outcome Date GA Lbr Len/2nd Weight Sex Delivery Anes PTL Lv  1 Current             Past Surgical History: Past Surgical History:  Procedure Laterality Date  . WISDOM TOOTH EXTRACTION      Family History: Family History  Problem Relation Age of Onset  . Diabetes Mother   . Hypertension Mother     Social History: Social History   Tobacco Use  . Smoking status: Former Smoker    Packs/day: 0.50  . Smokeless tobacco: Never Used  Substance Use Topics  . Alcohol use: No  . Drug use: No    Allergies:  Allergies  Allergen Reactions  . Ibuprofen Anaphylaxis  . Zofran  Anaphylaxis    anaphylaxis    Meds:  Medications Prior to Admission  Medication Sig Dispense Refill Last Dose  . Prenatal Vit-Fe Fumarate-FA (MULTIVITAMIN-PRENATAL) 27-0.8 MG TABS tablet Take 1 tablet by mouth daily at 12 noon.   08/05/2018 at Unknown time  . Blood Pressure KIT Check BP at home regularly large cuff z34.80 1 kit 0 Unknown at Unknown time    I have reviewed patient's Past Medical Hx, Surgical Hx, Family Hx, Social Hx, medications and allergies.   ROS:  Review of Systems  Unable to perform ROS: Acuity of condition  Does report bleeding, cramping, nausea and vomiting. Denies fever or chills.    Physical Exam   Patient Vitals for the past 24 hrs:  BP Pulse  08/06/18 2309 (!) 151/86 95   Constitutional: Well-developed, well-nourished female in considerable pain, unable to sit still.  Crying out repeatedly for pain medicine  .  Cardiovascular: normal rate and rhythm Respiratory: normal effort, clear to auscultation bilaterally GI: Abd soft, markedly tender over lower abdomen, nontender over upper abdomen.  Abdomen is gravid appropriate for gestational age.  There is some rebound and guarding. MS: Extremities nontender, no edema, normal ROM Neurologic: Alert and oriented x 4.  GU: Neg CVAT.  PELVIC EXAM: Cervix long and closed                           No cervical motion tenderness   FHT:  175   Labs:    Results for orders placed or performed during the hospital encounter of 08/06/18 (from the past 24 hour(s))  Urinalysis, Routine w reflex microscopic     Status: Abnormal   Collection Time: 08/06/18 11:12 PM  Result Value Ref Range   Color, Urine AMBER (A) YELLOW   APPearance TURBID (A) CLEAR   Specific Gravity, Urine 1.031 (H) 1.005 - 1.030   pH 5.0 5.0 - 8.0   Glucose, UA 50 (A) NEGATIVE mg/dL   Hgb urine dipstick LARGE (A) NEGATIVE   Bilirubin Urine SMALL (A) NEGATIVE   Ketones, ur 80 (A) NEGATIVE mg/dL   Protein, ur >=300 (A) NEGATIVE mg/dL   Nitrite  NEGATIVE NEGATIVE   Leukocytes,Ua MODERATE (A) NEGATIVE   RBC / HPF 11-20 0 - 5 RBC/hpf   WBC, UA 11-20 0 - 5 WBC/hpf   Bacteria, UA FEW (A) NONE SEEN   Squamous Epithelial / LPF 21-50 0 - 5   Mucus PRESENT   Urine rapid drug screen (hosp performed)     Status: Abnormal   Collection Time: 08/06/18 11:18 PM  Result Value Ref Range   Opiates NONE DETECTED NONE DETECTED   Cocaine NONE DETECTED NONE DETECTED   Benzodiazepines NONE DETECTED NONE DETECTED   Amphetamines NONE DETECTED NONE DETECTED   Tetrahydrocannabinol POSITIVE (A) NONE DETECTED   Barbiturates NONE DETECTED NONE DETECTED  CBC with Differential/Platelet     Status: Abnormal   Collection Time: 08/07/18 12:17 AM  Result Value Ref Range   WBC 20.9 (H) 4.0 - 10.5 K/uL   RBC 4.97 3.87 - 5.11 MIL/uL   Hemoglobin 13.1 12.0 - 15.0 g/dL   HCT 39.8 36.0 - 46.0 %   MCV 80.1 80.0 - 100.0 fL   MCH 26.4 26.0 - 34.0 pg   MCHC 32.9 30.0 - 36.0 g/dL   RDW 14.8 11.5 - 15.5 %   Platelets 257 150 - 400 K/uL   nRBC 0.0 0.0 - 0.2 %   Neutrophils Relative % 88 %   Neutro Abs 18.5 (H) 1.7 - 7.7 K/uL   Lymphocytes Relative 6 %   Lymphs Abs 1.3 0.7 - 4.0 K/uL   Monocytes Relative 5 %   Monocytes Absolute 0.9 0.1 - 1.0 K/uL   Eosinophils Relative 0 %   Eosinophils Absolute 0.0 0.0 - 0.5 K/uL   Basophils Relative 0 %   Basophils Absolute 0.0 0.0 - 0.1 K/uL   Immature Granulocytes 1 %   Abs Immature Granulocytes 0.13 (H) 0.00 - 0.07 K/uL  Comprehensive metabolic panel     Status: Abnormal   Collection Time: 08/07/18 12:17 AM  Result Value Ref Range   Sodium 137 135 - 145 mmol/L   Potassium 3.5 3.5 - 5.1 mmol/L   Chloride 107 98 - 111 mmol/L   CO2 16 (L) 22 - 32 mmol/L   Glucose, Bld 147 (H) 70 - 99 mg/dL   BUN 7 6 - 20 mg/dL   Creatinine, Ser 0.79 0.44 - 1.00 mg/dL   Calcium 9.4 8.9 - 10.3 mg/dL   Total Protein 7.8 6.5 - 8.1 g/dL   Albumin 3.3 (L) 3.5 - 5.0 g/dL   AST 16 15 - 41 U/L   ALT 19 0 - 44 U/L  Alkaline Phosphatase 57  38 - 126 U/L   Total Bilirubin 0.5 0.3 - 1.2 mg/dL   GFR calc non Af Amer >60 >60 mL/min   GFR calc Af Amer >60 >60 mL/min   Anion gap 14 5 - 15  Amylase     Status: Abnormal   Collection Time: 08/07/18 12:17 AM  Result Value Ref Range   Amylase 27 (L) 28 - 100 U/L  Lipase, blood     Status: None   Collection Time: 08/07/18 12:17 AM  Result Value Ref Range   Lipase 21 11 - 51 U/L     Imaging:   We first ordered a limited OB US to verify the position of the placenta, since she had small amount of bleeding.   It is fundal/ant.  Fluid normal.  Breech. Limited due to body habitus. .     US Abdomen Complete  Result Date: 08/07/2018 CLINICAL DATA:  28 y/o F; severe pelvic pain. Sixteen weeks pregnancy. Leukocytosis. Hematuria. EXAM: ABDOMEN ULTRASOUND COMPLETE COMPARISON:  Concurrent 08/07/2018 renal ultrasound. FINDINGS: Gallbladder: No gallstones or wall thickening visualized. No sonographic Murphy sign noted by sonographer. Common bile duct: Diameter: 3.1 mm Liver: No focal lesion identified. Within normal limits in parenchymal echogenicity. Portal vein is patent on color Doppler imaging with normal direction of blood flow towards the liver. IVC: No abnormality visualized. Pancreas: Visualized portion unremarkable. Spleen: Size and appearance within normal limits. Right Kidney: Length: Please refer to concurrent renal ultrasound. Left Kidney: Length: Please refer to concurrent renal ultrasound. Abdominal aorta: No aneurysm visualized. Other findings: None. IMPRESSION: Normal abdominal ultrasound. Electronically Signed   By: Kristine Garbe M.D.   On: 08/07/2018 02:59   Ct Abdomen Pelvis W Contrast  Result Date: 08/07/2018 CLINICAL DATA:  Severe lower abdominal pain EXAM: CT ABDOMEN AND PELVIS WITH CONTRAST TECHNIQUE: Multidetector CT imaging of the abdomen and pelvis was performed using the standard protocol following bolus administration of intravenous contrast. CONTRAST:  168m  OMNIPAQUE IOHEXOL 300 MG/ML  SOLN COMPARISON:  None available FINDINGS: Lower chest:  No contributory findings. Hepatobiliary: No focal liver abnormality.No evidence of biliary obstruction or stone. Pancreas: Unremarkable. Spleen: Unremarkable. Adrenals/Urinary Tract: Negative adrenals. No hydronephrosis or stone. Unremarkable bladder. Stomach/Bowel:  No obstruction. No appendicitis. Vascular/Lymphatic: No acute vascular abnormality. No mass or adenopathy. Reproductive:Gravid uterus evaluated by ultrasound earlier today. Breech position of the baby with legs at the distended lower uterine segment. Other: No ascites or pneumoperitoneum. Musculoskeletal: No acute abnormalities. IMPRESSION: No acute finding including appendicitis. Electronically Signed   By: JMonte FantasiaM.D.   On: 08/07/2018 04:27   [place]  Result Date: 08/07/2018 CLINICAL DATA:  28y/o F; severe pelvic pain. Sixteen weeks pregnancy. Leukocytosis. Hematuria. EXAM: RENAL / URINARY TRACT ULTRASOUND COMPLETE COMPARISON:  None. FINDINGS: Right Kidney: Renal measurements: 11.8 x 6.5 x 7.3 cm = volume: 293 mL . Echogenicity within normal limits. No mass or hydronephrosis visualized. Left Kidney: Renal measurements: 12.1 x 5.9 x 5.5 cm = volume: 202 mL. Echogenicity within normal limits. No mass or hydronephrosis visualized. Bladder: Appears normal for degree of bladder distention. Bilateral ureteral jets noted. IMPRESSION: Normal renal ultrasound. Electronically Signed   By: LKristine GarbeM.D.   On: 08/07/2018 02:57   MAU Course/MDM: Upon admission, we started an IV in order to draw blood, give fluids and pain medicine. Dilaudid and Phenergan given IV.  She was able to calm enough to do a pelvic exam.  Cervix was long and closed/firm.  No  CMT  CBC reviewed and noted to have a markedly elevated WBC with left shift.  Consulted Dr Harolyn Rutherford to discuss imaging needed.  She recommended starting with an abdominal and renal US to rule out  stones and cholelithiasis. Also to see if there was obvious indications of pelvic infection.  These were both negative so we decided to proceed with CT to look at the appendix and other areas.   CT was negative. Upon return, her vaginal bleeding increased and she called out from bathroom that she felt something coming out.  With a lot of encouragement, we were able to get her back into bed.  Fetal parts were protruding from vagina.  She coughed and delivered a female fetus intact.  There was one agonal movement but no heart rate visible.  FOB was brought to room and chaplain also came per pt request.  Cytotec 825mg buccally for delivery of placenta.  Patient delivered that into toilet and I was unable to see it.  Pelvic exam showed absence of placenta/clamps.  Very little vaginal bleeding was noted.    We gave more analgesia and phenergan.  Patient noted to be sleeping soundly but would wake up and tell RN her pain was 10/10. We gave her a dose of Zithromax and Suprax to cover possible  GC and Chlamydia due to recent infections and pending test results. She tolerated those pills well.  Will discharge home after med observation time.   Assessment: Single intrauterine pregnancy at 181w5dpontaneous abortion, likely septic abortion Recent chlamydia and syphilis, treated   Plan: Discharge home Bleeding precautions Follow up in Office for prenatal visits and recheck of status  Encouraged to return here or to other Urgent Care/ED if she develops worsening of symptoms, increase in pain, fever, or other concerning symptoms.   Pt stable at time of discharge.  MaHansel FeinsteinNM, MSN Certified Nurse-Midwife 08/06/2018 11:48 PM

## 2018-08-06 NOTE — Progress Notes (Signed)
Pt presents for initial prenatal visit. Pt states that this was not a planned pregnancy. She states [place] [facility] [place] [facility] [place]. She states that her pap was normal. She was treated for Chlamydia 3 weeks ago, and completed treatment. Pt plans on breast and bottle feeding. Her concerns today are lower abdominal pain, and right ear pain.

## 2018-08-07 ENCOUNTER — Inpatient Hospital Stay (HOSPITAL_BASED_OUTPATIENT_CLINIC_OR_DEPARTMENT_OTHER): Payer: Medicaid Other

## 2018-08-07 ENCOUNTER — Inpatient Hospital Stay (HOSPITAL_COMMUNITY): Payer: Medicaid Other

## 2018-08-07 ENCOUNTER — Encounter (HOSPITAL_COMMUNITY): Payer: Self-pay | Admitting: Radiology

## 2018-08-07 DIAGNOSIS — O26892 Other specified pregnancy related conditions, second trimester: Secondary | ICD-10-CM

## 2018-08-07 DIAGNOSIS — O99212 Obesity complicating pregnancy, second trimester: Secondary | ICD-10-CM | POA: Diagnosis not present

## 2018-08-07 DIAGNOSIS — O039 Complete or unspecified spontaneous abortion without complication: Secondary | ICD-10-CM

## 2018-08-07 DIAGNOSIS — O4692 Antepartum hemorrhage, unspecified, second trimester: Secondary | ICD-10-CM

## 2018-08-07 DIAGNOSIS — Z3A16 16 weeks gestation of pregnancy: Secondary | ICD-10-CM

## 2018-08-07 DIAGNOSIS — R109 Unspecified abdominal pain: Secondary | ICD-10-CM

## 2018-08-07 DIAGNOSIS — R102 Pelvic and perineal pain: Secondary | ICD-10-CM

## 2018-08-07 LAB — COMPREHENSIVE METABOLIC PANEL
ALT: 19 U/L (ref 0–44)
AST: 16 U/L (ref 15–41)
Albumin: 3.3 g/dL — ABNORMAL LOW (ref 3.5–5.0)
Alkaline Phosphatase: 57 U/L (ref 38–126)
Anion gap: 14 (ref 5–15)
BUN: 7 mg/dL (ref 6–20)
CO2: 16 mmol/L — ABNORMAL LOW (ref 22–32)
Calcium: 9.4 mg/dL (ref 8.9–10.3)
Chloride: 107 mmol/L (ref 98–111)
Creatinine, Ser: 0.79 mg/dL (ref 0.44–1.00)
GFR calc Af Amer: >60 mL/min (ref >60)
GFR calc non Af Amer: >60 mL/min (ref >60)
Glucose, Bld: 147 mg/dL — ABNORMAL HIGH (ref 70–99)
Potassium: 3.5 mmol/L (ref 3.5–5.1)
Sodium: 137 mmol/L (ref 135–145)
Total Bilirubin: 0.5 mg/dL (ref 0.3–1.2)
Total Protein: 7.8 g/dL (ref 6.5–8.1)

## 2018-08-07 LAB — URINALYSIS, ROUTINE W REFLEX MICROSCOPIC
Glucose, UA: 50 mg/dL — AB
Ketones, ur: 80 mg/dL — AB
Nitrite: NEGATIVE
Protein, ur: >=300 mg/dL — AB
Specific Gravity, Urine: 1.031 — ABNORMAL HIGH (ref 1.005–1.030)
pH: 5 (ref 5.0–8.0)

## 2018-08-07 LAB — CBC WITH DIFFERENTIAL/PLATELET
Abs Immature Granulocytes: 0.13 10*3/uL — ABNORMAL HIGH (ref 0.00–0.07)
Basophils Absolute: 0 10*3/uL (ref 0.0–0.1)
Basophils Relative: 0 %
Eosinophils Absolute: 0 10*3/uL (ref 0.0–0.5)
Eosinophils Relative: 0 %
HCT: 39.8 % (ref 36.0–46.0)
Hemoglobin: 13.1 g/dL (ref 12.0–15.0)
Immature Granulocytes: 1 %
Lymphocytes Relative: 6 %
Lymphs Abs: 1.3 10*3/uL (ref 0.7–4.0)
MCH: 26.4 pg (ref 26.0–34.0)
MCHC: 32.9 g/dL (ref 30.0–36.0)
MCV: 80.1 fL (ref 80.0–100.0)
Monocytes Absolute: 0.9 10*3/uL (ref 0.1–1.0)
Monocytes Relative: 5 %
Neutro Abs: 18.5 10*3/uL — ABNORMAL HIGH (ref 1.7–7.7)
Neutrophils Relative %: 88 %
Platelets: 257 10*3/uL (ref 150–400)
RBC: 4.97 MIL/uL (ref 3.87–5.11)
RDW: 14.8 % (ref 11.5–15.5)
WBC: 20.9 10*3/uL — ABNORMAL HIGH (ref 4.0–10.5)
nRBC: 0 % (ref 0.0–0.2)

## 2018-08-07 LAB — CERVICOVAGINAL ANCILLARY ONLY
Bacterial vaginitis: POSITIVE — AB
Candida vaginitis: NEGATIVE
Chlamydia: NEGATIVE
Neisseria Gonorrhea: NEGATIVE
Trichomonas: NEGATIVE

## 2018-08-07 LAB — LIPASE, BLOOD: Lipase: 21 U/L (ref 11–51)

## 2018-08-07 LAB — AMYLASE: Amylase: 27 U/L — ABNORMAL LOW (ref 28–100)

## 2018-08-07 MED ORDER — AZITHROMYCIN 250 MG PO TABS
1000.0000 mg | ORAL_TABLET | Freq: Once | ORAL | Status: AC
  Administered 2018-08-07: 08:00:00 1000 mg via ORAL
  Filled 2018-08-07: qty 4

## 2018-08-07 MED ORDER — HYDROMORPHONE HCL 1 MG/ML IJ SOLN
1.0000 mg | Freq: Once | INTRAMUSCULAR | Status: AC
  Administered 2018-08-07: 1 mg via INTRAVENOUS
  Filled 2018-08-07: qty 1

## 2018-08-07 MED ORDER — OXYCODONE-ACETAMINOPHEN 5-325 MG PO TABS: 1.0000 | ORAL_TABLET | Freq: Four times a day (QID) | ORAL | 0 refills | Status: DC | PRN

## 2018-08-07 MED ORDER — OXYCODONE-ACETAMINOPHEN 5-325 MG PO TABS
2.0000 | ORAL_TABLET | Freq: Once | ORAL | Status: AC
  Administered 2018-08-07: 2 via ORAL
  Filled 2018-08-07: qty 2

## 2018-08-07 MED ORDER — SODIUM CHLORIDE 0.9 % IV SOLN
INTRAVENOUS | Status: DC
  Administered 2018-08-07: 03:00:00 via INTRAVENOUS

## 2018-08-07 MED ORDER — PROMETHAZINE HCL 25 MG PO TABS: 12.5000 mg | ORAL_TABLET | Freq: Four times a day (QID) | ORAL | 0 refills | Status: DC | PRN

## 2018-08-07 MED ORDER — HYDROMORPHONE HCL 1 MG/ML IJ SOLN
1.0000 mg | INTRAMUSCULAR | Status: AC | PRN
  Administered 2018-08-07: 1 mg via INTRAVENOUS
  Filled 2018-08-07: qty 1

## 2018-08-07 MED ORDER — MISOPROSTOL 200 MCG PO TABS
800.0000 ug | ORAL_TABLET | Freq: Once | ORAL | Status: AC
  Administered 2018-08-07: 800 ug via BUCCAL
  Filled 2018-08-07: qty 4

## 2018-08-07 MED ORDER — CEFIXIME 400 MG PO CAPS
400.0000 mg | ORAL_CAPSULE | Freq: Once | ORAL | Status: AC
  Administered 2018-08-07: 400 mg via ORAL
  Filled 2018-08-07: qty 1

## 2018-08-07 MED ORDER — PROMETHAZINE HCL 25 MG PO TABS
25.0000 mg | ORAL_TABLET | Freq: Once | ORAL | Status: AC
  Administered 2018-08-07: 25 mg via ORAL
  Filled 2018-08-07: qty 1

## 2018-08-07 MED ORDER — DEXTROSE 5 % IV SOLN: 500.0000 mg | Freq: Once | INTRAVENOUS | Status: DC

## 2018-08-07 MED ORDER — IOHEXOL 300 MG/ML  SOLN
125.0000 mL | Freq: Once | INTRAMUSCULAR | Status: AC | PRN
  Administered 2018-08-07: 125 mL via INTRAVENOUS

## 2018-08-07 NOTE — MAU Note (Signed)
RN informed pt that she will be discharged. Pt requesting that RN contact SO, Jeneen Rinks, regarding discharge.  [place] notified, voicemail left. Will attempt to call again.

## 2018-08-07 NOTE — MAU Note (Signed)
Vital signs discussed with Aviva Kluver, provider states pt is okay to be discharged home.

## 2018-08-07 NOTE — MAU Note (Signed)
Pt assisted to bathroom and getting dressed. Discharge instructions reviewed. Pt verbalizes understanding.

## 2018-08-07 NOTE — Discharge Instructions (Signed)
Miscarriage °A miscarriage is the loss of an unborn baby (fetus) before the 20th week of pregnancy. Most miscarriages happen during the first 3 months of pregnancy. Sometimes, a miscarriage can happen before a woman knows that she is pregnant. °Having a miscarriage can be an emotional experience. If you have had a miscarriage, talk with your health care provider about any questions you may have about miscarrying, the grieving process, and your plans for future pregnancy. °What are the causes? °A miscarriage may be caused by: °· Problems with the genes or chromosomes of the fetus. These problems make it impossible for the baby to develop normally. They are often the result of random errors that occur early in the development of the baby, and are not passed from parent to child (not inherited). °· Infection of the cervix or uterus. °· Conditions that affect hormone balance in the body. °· Problems with the cervix, such as the cervix opening and thinning before pregnancy is at term (cervical insufficiency). °· Problems with the uterus. These may include: °? A uterus with an abnormal shape. °? Fibroids in the uterus. °? Congenital abnormalities. These are problems that were present at birth. °· Certain medical conditions. °· Smoking, drinking alcohol, or using drugs. °· Injury (trauma). °In many cases, the cause of a miscarriage is not known. °What are the signs or symptoms? °Symptoms of this condition include: °· Vaginal bleeding or spotting, with or without cramps or pain. °· Pain or cramping in the abdomen or lower back. °· Passing fluid, tissue, or blood clots from the vagina. °How is this diagnosed? °This condition may be diagnosed based on: °· A physical exam. °· Ultrasound. °· Blood tests. °· Urine tests. °How is this treated? °Treatment for a miscarriage is sometimes not necessary if you naturally pass all the tissue that was in your uterus. If necessary, this condition may be treated with: °· Dilation and  curettage (D&C). This is a procedure in which the cervix is stretched open and the lining of the uterus (endometrium) is scraped. This is done only if tissue from the fetus or placenta remains in the body (incomplete miscarriage). °· Medicines, such as: °? Antibiotic medicine, to treat infection. °? Medicine to help the body pass any remaining tissue. °? Medicine to reduce (contract) the size of the uterus. These medicines may be given if you have a lot of bleeding. °If you have Rh negative blood and your baby was Rh positive, you will need a shot of a medicine called Rh immunoglobulinto protect your future babies from Rh blood problems. "Rh-negative" and "Rh-positive" refer to whether or not the blood has a specific protein found on the surface of red blood cells (Rh factor). °Follow these instructions at home: °Medicines ° °· Take over-the-counter and prescription medicines only as told by your health care provider. °· If you were prescribed antibiotic medicine, take it as told by your health care provider. Do not stop taking the antibiotic even if you start to feel better. °· Do not take NSAIDs, such as aspirin and ibuprofen, unless they are approved by your health care provider. These medicines can cause bleeding. °Activity °· Rest as directed. Ask your health care provider what activities are safe for you. °· Have someone help with home and family responsibilities during this time. °General instructions °· Keep track of the number of sanitary pads you use each day and how soaked (saturated) they are. Write down this information. °· Monitor the amount of tissue or blood clots that   you pass from your vagina. Save any large amounts of tissue for your health care provider to examine. °· Do not use tampons, douche, or have sex until your health care provider approves. °· To help you and your partner with the process of grieving, talk with your health care provider or seek counseling. °· When you are ready, meet with  your health care provider to discuss any important steps you should take for your health. Also, discuss steps you should take to have a healthy pregnancy in the future. °· Keep all follow-up visits as told by your health care provider. This is important. °Where to find more information °· The American Congress of Obstetricians and Gynecologists: www.acog.org °· [place]. Department of Health and Human Services Office of Women’s Health: www.womenshealth.gov °Contact a health care provider if: °· You have a fever or chills. °· You have a foul smelling vaginal discharge. °· You have more bleeding instead of less. °Get help right away if: °· You have severe cramps or pain in your back or abdomen. °· You pass blood clots or tissue from your vagina that is walnut-sized or larger. °· You soak more than 1 regular sanitary pad in an hour. °· You become light-headed or weak. °· You pass out. °· You have feelings of sadness that take over your thoughts, or you have thoughts of hurting yourself. °Summary °· Most miscarriages happen in the first 3 months of pregnancy. Sometimes miscarriage happens before a woman even knows that she is pregnant. °· Follow your health care provider's instruction for home care. Keep all follow-up appointments. °· To help you and your partner with the process of grieving, talk with your health care provider or seek counseling. °This information is not intended to replace advice given to you by your health care provider. Make sure you discuss any questions you have with your health care provider. °Document Released: 07/05/2000 Document Revised: 05/03/2018 Document Reviewed: 02/15/2016 °Elsevier Patient Education © 2020 [employer] ° °

## 2018-08-07 NOTE — MAU Note (Signed)
[place] called, informed him that pt is ready for discharge. Informed Jeneen Rinks that he can call the unit when he is here and this RN will assist pt to the car. Jeneen Rinks verbalized understanding.

## 2018-08-07 NOTE — MAU Note (Signed)
Pt reported back from CT scan with more bleeding and passing blood clots. Provider called to room where patient was in the bathroom. Pt stated she felt something "coming out of her" and feet were in the vagina. Patient was escorted back to bed. At 0440 patient delivered a stillborn female infant by Hansel Feinstein CNM at bedside was Patricia Pesa RN. Provider placed a cord clamp and cut the cord, baby was wrapped and given to the mother.EBL at delivery was 160mL. Provider ordered 825mcg of buccal cytotec. Nicoya Friel RN stepped out of room to get cytotec and covering for the baby and came back patient had went to the bathroom with RN [employer]. Benji stated that the toilet was filled with large black clots that seemed to be the placenta. That was around 0520. Pt given buccal cytotec at [facility]. Bleeding was minimal after delivery. Provider did a internal exam after the patient went to the BR and stated it was normal internal exam. Chaplin came and spoke with the patient and her FOB around 0545.Pt sleeping off and on. Patient stated she and the FOB want to do crematory through [employer]. Patient informed that her and the father of the baby both must go and sign consent at there office. Patient given a card with there number and instructions. [facility] notified of delivery and of families plans. At 0700 report was given to [place]. Patient stable lying in bed.   Weight of fetus: 96 grams  Chaplin Notified @0500  Chaplin arrived at [phone]

## 2018-08-07 NOTE — MAU Note (Signed)
Assisted pt via wheelchair out to car

## 2018-08-07 NOTE — MAU Note (Signed)
Pt called out after CT reporting more bleeding and pain. Pt reports passing clots. Provider notified.

## 2018-08-08 ENCOUNTER — Other Ambulatory Visit: Payer: Self-pay | Admitting: Obstetrics

## 2018-08-08 DIAGNOSIS — B9689 Other specified bacterial agents as the cause of diseases classified elsewhere: Secondary | ICD-10-CM

## 2018-08-08 DIAGNOSIS — N760 Acute vaginitis: Secondary | ICD-10-CM

## 2018-08-08 LAB — CYTOLOGY - PAP: Diagnosis: NEGATIVE

## 2018-08-08 LAB — CULTURE, OB URINE

## 2018-08-08 LAB — URINE CULTURE, OB REFLEX

## 2018-08-08 MED ORDER — TINIDAZOLE 500 MG PO TABS: 1000.0000 mg | ORAL_TABLET | Freq: Every day | ORAL | 2 refills | Status: DC

## 2018-08-08 NOTE — Progress Notes (Signed)
Pt was lying in bed and awake when I first arrived. She immediately asked, "what are they going to do with my baby."  I continued the conversation to explore what she would like to happen to her baby. She said she just wanted him cremated. I told her I would talk with her nurse to find out the best way to handle that (since her baby was less than 20 weeks).  Pt dozed off for a moment after saying okay. When I returned with more information, her SO Jeneen Rinks) arrived. With her permission we talked about cremation and I explained they will need to visit the funeral home before the mortician can pick up their baby. Pt's SO said he would take care of it (arrangements and costs). Pt appeared to be asleep and snoring and SO said he was leaving to go rest. Please page if additional support is needed prior to pt's discharge. Tina, MDiv   08/07/18 0730  Clinical Encounter Type  Visited With Patient;Patient and family together

## 2018-08-09 LAB — OBSTETRIC PANEL, INCLUDING HIV
Basophils Absolute: 0 10*3/uL (ref 0.0–0.2)
Basos: 0 %
EOS (ABSOLUTE): 0 10*3/uL (ref 0.0–0.4)
Eos: 0 %
HIV Screen 4th Generation wRfx: NONREACTIVE
Hematocrit: 36.9 % (ref 34.0–46.6)
Hemoglobin: 12.3 g/dL (ref 11.1–15.9)
Hepatitis B Surface Ag: NEGATIVE
Immature Grans (Abs): 0.1 10*3/uL (ref 0.0–0.1)
Immature Granulocytes: 1 %
Lymphocytes Absolute: 1.9 10*3/uL (ref 0.7–3.1)
Lymphs: 12 %
MCH: 25.8 pg — ABNORMAL LOW (ref 26.6–33.0)
MCHC: 33.3 g/dL (ref 31.5–35.7)
MCV: 78 fL — ABNORMAL LOW (ref 79–97)
Monocytes Absolute: 1.1 10*3/uL — ABNORMAL HIGH (ref 0.1–0.9)
Monocytes: 7 %
Neutrophils Absolute: 12.6 10*3/uL — ABNORMAL HIGH (ref 1.4–7.0)
Neutrophils: 80 %
Platelets: 260 10*3/uL (ref 150–450)
RBC: 4.76 x10E6/uL (ref 3.77–5.28)
RDW: 13.9 % (ref 11.7–15.4)
RPR Ser Ql: NONREACTIVE
Rh Factor: POSITIVE
Rubella Antibodies, IGG: <0.9 index — ABNORMAL LOW (ref >0.99)
WBC: 15.8 10*3/uL — ABNORMAL HIGH (ref 3.4–10.8)

## 2018-08-09 LAB — AB SCR+ANTIBODY ID: Antibody Screen: POSITIVE — AB

## 2018-08-18 DIAGNOSIS — O98812 Other maternal infectious and parasitic diseases complicating pregnancy, second trimester: Secondary | ICD-10-CM

## 2018-08-18 DIAGNOSIS — Z283 Underimmunization status: Secondary | ICD-10-CM | POA: Insufficient documentation

## 2018-08-18 DIAGNOSIS — A749 Chlamydial infection, unspecified: Secondary | ICD-10-CM

## 2018-08-18 DIAGNOSIS — O09899 Supervision of other high risk pregnancies, unspecified trimester: Secondary | ICD-10-CM | POA: Insufficient documentation

## 2018-08-18 HISTORY — DX: Chlamydial infection, unspecified: A74.9

## 2018-08-18 HISTORY — DX: Other maternal infectious and parasitic diseases complicating pregnancy, second trimester: O98.812

## 2018-08-21 ENCOUNTER — Ambulatory Visit: Payer: Medicaid Other | Admitting: Obstetrics & Gynecology

## 2018-08-23 ENCOUNTER — Other Ambulatory Visit (HOSPITAL_COMMUNITY): Payer: Medicaid Other

## 2018-08-30 ENCOUNTER — Telehealth: Payer: Self-pay | Admitting: Obstetrics & Gynecology

## 2018-09-03 ENCOUNTER — Encounter: Payer: Medicaid Other | Admitting: Obstetrics and Gynecology

## 2018-10-12 ENCOUNTER — Emergency Department (HOSPITAL_COMMUNITY)
Admission: EM | Admit: 2018-10-12 | Discharge: 2018-10-13 | Disposition: A | Discharge status: Home / Self Care | Payer: Medicaid Other | Attending: Emergency Medicine | Admitting: Emergency Medicine

## 2018-10-12 ENCOUNTER — Emergency Department (HOSPITAL_COMMUNITY): Payer: Medicaid Other

## 2018-10-12 ENCOUNTER — Other Ambulatory Visit: Payer: Self-pay

## 2018-10-12 ENCOUNTER — Encounter (HOSPITAL_COMMUNITY): Payer: Self-pay | Admitting: *Deleted

## 2018-10-12 DIAGNOSIS — Z79899 Other long term (current) drug therapy: Secondary | ICD-10-CM | POA: Insufficient documentation

## 2018-10-12 DIAGNOSIS — Z87891 Personal history of nicotine dependence: Secondary | ICD-10-CM | POA: Diagnosis not present

## 2018-10-12 DIAGNOSIS — R112 Nausea with vomiting, unspecified: Secondary | ICD-10-CM | POA: Diagnosis not present

## 2018-10-12 DIAGNOSIS — R1084 Generalized abdominal pain: Secondary | ICD-10-CM | POA: Diagnosis not present

## 2018-10-12 DIAGNOSIS — R197 Diarrhea, unspecified: Secondary | ICD-10-CM | POA: Diagnosis not present

## 2018-10-12 DIAGNOSIS — R109 Unspecified abdominal pain: Secondary | ICD-10-CM

## 2018-10-12 LAB — CBC
HCT: 41.4 % (ref 36.0–46.0)
Hemoglobin: 13.3 g/dL (ref 12.0–15.0)
MCH: 26.7 pg (ref 26.0–34.0)
MCHC: 32.1 g/dL (ref 30.0–36.0)
MCV: 83 fL (ref 80.0–100.0)
Platelets: 242 10*3/uL (ref 150–400)
RBC: 4.99 MIL/uL (ref 3.87–5.11)
RDW: 13.4 % (ref 11.5–15.5)
WBC: 11.9 10*3/uL — ABNORMAL HIGH (ref 4.0–10.5)
nRBC: 0 % (ref 0.0–0.2)

## 2018-10-12 LAB — URINALYSIS, ROUTINE W REFLEX MICROSCOPIC
Bilirubin Urine: NEGATIVE
Glucose, UA: 50 mg/dL — AB
Hgb urine dipstick: NEGATIVE
Ketones, ur: 5 mg/dL — AB
Leukocytes,Ua: NEGATIVE
Nitrite: NEGATIVE
Protein, ur: NEGATIVE mg/dL
Specific Gravity, Urine: 1.021 (ref 1.005–1.030)
pH: 8 (ref 5.0–8.0)

## 2018-10-12 LAB — COMPREHENSIVE METABOLIC PANEL
ALT: 12 U/L (ref 0–44)
AST: 16 U/L (ref 15–41)
Albumin: 3.7 g/dL (ref 3.5–5.0)
Alkaline Phosphatase: 67 U/L (ref 38–126)
Anion gap: 11 (ref 5–15)
BUN: 10 mg/dL (ref 6–20)
CO2: 19 mmol/L — ABNORMAL LOW (ref 22–32)
Calcium: 9 mg/dL (ref 8.9–10.3)
Chloride: 108 mmol/L (ref 98–111)
Creatinine, Ser: 0.84 mg/dL (ref 0.44–1.00)
GFR calc Af Amer: >60 mL/min (ref >60)
GFR calc non Af Amer: >60 mL/min (ref >60)
Glucose, Bld: 152 mg/dL — ABNORMAL HIGH (ref 70–99)
Potassium: 3.6 mmol/L (ref 3.5–5.1)
Sodium: 138 mmol/L (ref 135–145)
Total Bilirubin: 0.4 mg/dL (ref 0.3–1.2)
Total Protein: 7.7 g/dL (ref 6.5–8.1)

## 2018-10-12 LAB — I-STAT BETA HCG BLOOD, ED (MC, WL, AP ONLY): I-stat hCG, quantitative: <5 m[IU]/mL (ref <5)

## 2018-10-12 LAB — LIPASE, BLOOD: Lipase: 20 U/L (ref 11–51)

## 2018-10-12 MED ORDER — METOCLOPRAMIDE HCL 10 MG PO TABS: 10.0000 mg | ORAL_TABLET | Freq: Four times a day (QID) | ORAL | 0 refills | Status: DC | PRN

## 2018-10-12 MED ORDER — METOCLOPRAMIDE HCL 5 MG/ML IJ SOLN
10.0000 mg | Freq: Once | INTRAMUSCULAR | Status: AC
  Administered 2018-10-12: 20:00:00 10 mg via INTRAVENOUS
  Filled 2018-10-12: qty 2

## 2018-10-12 MED ORDER — HYDROMORPHONE HCL 1 MG/ML IJ SOLN
1.0000 mg | Freq: Once | INTRAMUSCULAR | Status: AC
  Administered 2018-10-12: 19:00:00 1 mg via INTRAVENOUS
  Filled 2018-10-12: qty 1

## 2018-10-12 MED ORDER — SODIUM CHLORIDE 0.9% FLUSH
3.0000 mL | Freq: Once | INTRAVENOUS | Status: AC
  Administered 2018-10-12: 19:00:00 3 mL via INTRAVENOUS

## 2018-10-12 MED ORDER — DROPERIDOL 2.5 MG/ML IJ SOLN
1.2500 mg | Freq: Once | INTRAMUSCULAR | Status: AC
  Administered 2018-10-13: 1.25 mg via INTRAVENOUS
  Filled 2018-10-12: qty 2

## 2018-10-12 MED ORDER — MORPHINE SULFATE (PF) 4 MG/ML IV SOLN
8.0000 mg | Freq: Once | INTRAVENOUS | Status: AC
  Administered 2018-10-12: 20:00:00 8 mg via INTRAVENOUS
  Filled 2018-10-12: qty 2

## 2018-10-12 MED ORDER — SODIUM CHLORIDE 0.9 % IV BOLUS
1000.0000 mL | Freq: Once | INTRAVENOUS | Status: AC
  Administered 2018-10-12: 1000 mL via INTRAVENOUS

## 2018-10-12 MED ORDER — IOHEXOL 300 MG/ML  SOLN
100.0000 mL | Freq: Once | INTRAMUSCULAR | Status: AC | PRN
  Administered 2018-10-12: 100 mL via INTRAVENOUS

## 2018-10-12 NOTE — ED Provider Notes (Signed)
[facility] EMERGENCY DEPARTMENT Provider Note   CSN: [id number] Arrival date & time: 10/12/18  1607     History   Chief Complaint Chief Complaint  Patient presents with  . Abdominal Pain    HPI Jacqueline Shepherd is a 28 y.o. female.     Patient with history of chlamydia, pregnancy presents with diffuse abdominal pain severe along with vomiting and mild diarrhea.  Patient's had this a few times in the past few months.  No focal radiation.  No focality to the location.  Difficulty getting details this patient says pain is so severe.  No urinary symptoms.  No surgery history     Past Medical History:  Diagnosis Date  . Abdominal pain     Patient Active Problem List   Diagnosis Date Noted  . Chlamydia infection complicating pregnancy in second trimester 08/18/2018  . Rubella non-immune status, antepartum 08/18/2018  . Supervision of other normal pregnancy, antepartum 08/06/2018    Past Surgical History:  Procedure Laterality Date  . WISDOM TOOTH EXTRACTION       OB History    Gravida  1   Para      Term      Preterm      AB      Living        SAB      TAB      Ectopic      Multiple      Live Births               Home Medications    Prior to Admission medications   Medication Sig Start Date End Date Taking? Authorizing Provider  Blood Pressure KIT Check BP at home regularly large cuff z34.80 08/06/18   Shelly Bombard, MD  metoCLOPramide (REGLAN) 10 MG tablet Take 1 tablet (10 mg total) by mouth every 6 (six) hours as needed for nausea (nausea/headache). 10/12/18   Elnora Morrison, MD  oxyCODONE-acetaminophen (PERCOCET/ROXICET) 5-325 MG tablet Take 1-2 tablets by mouth every 6 (six) hours as needed for severe pain. 08/07/18   Tresea Mall, CNM  Prenatal Vit-Fe Fumarate-FA (MULTIVITAMIN-PRENATAL) 27-0.8 MG TABS tablet Take 1 tablet by mouth daily at 12 noon.    [provider]  promethazine (PHENERGAN) 25 MG  tablet Take 0.5-1 tablets (12.5-25 mg total) by mouth every 6 (six) hours as needed for nausea or vomiting. 08/07/18   Tresea Mall, CNM  tinidazole (TINDAMAX) 500 MG tablet Take 2 tablets (1,000 mg total) by mouth daily with breakfast. 08/08/18   Shelly Bombard, MD    Family History Family History  Problem Relation Age of Onset  . Diabetes Mother   . Hypertension Mother     Social History Social History   Tobacco Use  . Smoking status: Former Smoker    Packs/day: 0.50  . Smokeless tobacco: Never Used  Substance Use Topics  . Alcohol use: No  . Drug use: No     Allergies   Ibuprofen and Zofran   Review of Systems Review of Systems  Constitutional: Negative for chills and fever.  HENT: Negative for congestion.   Eyes: Negative for visual disturbance.  Respiratory: Negative for shortness of breath.   Cardiovascular: Negative for chest pain.  Gastrointestinal: Positive for abdominal pain, diarrhea, nausea and vomiting.  Genitourinary: Negative for dysuria and flank pain.  Musculoskeletal: Negative for back pain, neck pain and neck stiffness.  Skin: Negative for rash.  Neurological: Negative for light-headedness and  headaches.     Physical Exam Updated Vital Signs BP (!) 155/103 (BP Location: Right Arm)   Pulse 91   Temp 98.4 F (36.9 C) (Oral)   Resp (!) 22   Ht '5\' 6"'$  (1.676 m)   Wt (!) 150.8 kg   LMP 04/05/2018   SpO2 100%   BMI 53.66 kg/m   Physical Exam Vitals signs and nursing note reviewed.  Constitutional:      Appearance: [name] well-developed.  HENT:     Head: Normocephalic and atraumatic.  Eyes:     General:        Right eye: No discharge.        Left eye: No discharge.     Conjunctiva/sclera: Conjunctivae normal.  Neck:     Musculoskeletal: Normal range of motion and neck supple.     Trachea: No tracheal deviation.  Cardiovascular:     Rate and Rhythm: Normal rate and regular rhythm.  Pulmonary:     Effort: Pulmonary effort is  normal.     Breath sounds: Normal breath sounds.  Abdominal:     General: There is no distension.     Palpations: Abdomen is soft.     Tenderness: There is generalized abdominal tenderness (diffuse ). There is no guarding.  Skin:    General: Skin is warm.     Findings: No rash.  Neurological:     Mental Status: [name] alert and oriented to person, place, and time.  Psychiatric:        Mood and Affect: Mood is anxious.      ED Treatments / Results  Labs (all labs ordered are listed, but only abnormal results are displayed) Labs Reviewed  COMPREHENSIVE METABOLIC PANEL - Abnormal; Notable for the following components:      Result Value   CO2 19 (*)    Glucose, Bld 152 (*)    All other components within normal limits  CBC - Abnormal; Notable for the following components:   WBC 11.9 (*)    All other components within normal limits  URINALYSIS, ROUTINE W REFLEX MICROSCOPIC - Abnormal; Notable for the following components:   Color, Urine STRAW (*)    Glucose, UA 50 (*)    Ketones, ur 5 (*)    All other components within normal limits  LIPASE, BLOOD  I-STAT BETA HCG BLOOD, ED ([facility], [facility], [facility] ONLY)  GC/CHLAMYDIA PROBE AMP ([place]) NOT AT [facility]    EKG None  Radiology Ct Abdomen Pelvis W Contrast  Result Date: 10/12/2018 CLINICAL DATA:  Acute abdominal pain. EXAM: CT ABDOMEN AND PELVIS WITH CONTRAST TECHNIQUE: Multidetector CT imaging of the abdomen and pelvis was performed using the standard protocol following bolus administration of intravenous contrast. CONTRAST:  154m OMNIPAQUE IOHEXOL 300 MG/ML  SOLN COMPARISON:  08/07/2018 FINDINGS: Lower chest: The lung bases are clear. The heart size is normal. Hepatobiliary: The liver is normal. Normal gallbladder.There is no biliary ductal dilation. Pancreas: Normal contours without ductal dilatation. No peripancreatic fluid collection. Spleen: No splenic laceration or hematoma. Adrenals/Urinary Tract: --Adrenal glands: No adrenal  hemorrhage. --Right kidney/ureter: No hydronephrosis or perinephric hematoma. --Left kidney/ureter: No hydronephrosis or perinephric hematoma. --Urinary bladder: Unremarkable. Stomach/Bowel: --Stomach/Duodenum: No hiatal hernia or other gastric abnormality. Normal duodenal course and caliber. --Small bowel: No dilatation or inflammation. --Colon: No focal abnormality. --Appendix: Normal. Vascular/Lymphatic: Normal course and caliber of the major abdominal vessels. There is a retroaortic left renal vein, a normal variant. --No retroperitoneal lymphadenopathy. --No mesenteric lymphadenopathy. --No pelvic  or inguinal lymphadenopathy. Reproductive: Unremarkable Other: No ascites or free air. The abdominal wall is normal. Musculoskeletal. No acute displaced fractures. IMPRESSION: No acute abdominopelvic abnormality. Electronically Signed   By: Constance Holster M.D.   On: 10/12/2018 19:59    Procedures Ultrasound ED Peripheral IV (Provider)  Date/Time: 10/12/2018 7:30 PM Performed by: Elnora Morrison, MD Authorized by: Elnora Morrison, MD   Procedure details:    Indications: multiple failed IV attempts     Skin Prep: chlorhexidine gluconate     Location:  Left AC   Angiocath:  18 G   Bedside Ultrasound Guided: Yes     Images: archived     Patient tolerated procedure without complications: Yes     Dressing applied: Yes     (including critical care time)  Medications Ordered in ED Medications  sodium chloride flush (NS) 0.9 % injection 3 mL (3 mLs Intravenous Given 10/12/18 1919)  HYDROmorphone (DILAUDID) injection 1 mg (1 mg Intravenous Given 10/12/18 1920)  sodium chloride 0.9 % bolus 1,000 mL (1,000 mLs Intravenous New Bag/Given 10/12/18 1921)  metoCLOPramide (REGLAN) injection 10 mg (10 mg Intravenous Given 10/12/18 2004)  iohexol (OMNIPAQUE) 300 MG/ML solution 100 mL (100 mLs Intravenous Contrast Given 10/12/18 1950)  morphine 4 MG/ML injection 8 mg (8 mg Intravenous Given 10/12/18 2013)      Initial Impression / Assessment and Plan / ED Course  I have reviewed the triage vital signs and the nursing notes.  Pertinent labs & imaging results that were available during my care of the patient were reviewed by me and considered in my medical decision making (see chart for details).       Patient presents with severe abdominal pain diffuse, uncomfortable in the room.  Difficult IV, ultrasound-guided by myself.  IV fluids, pain meds, antiemetics, blood work and CT scan to help further delineate her severe pain.  Patient's pain and vomiting improved in the ER.  CT scan no acute findings.  Blood work overall reassuring with minimal leukocytosis 11.9, normal kidney function, normal hemoglobin.  Urinalysis no sign of significant infection.  CT scan reviewed. Discussed outpatient follow-up, Reglan as needed.    Final Clinical Impressions(s) / ED Diagnoses   Final diagnoses:  Abdominal pain, unspecified abdominal location  Nausea vomiting and diarrhea    ED Discharge Orders         Ordered    metoCLOPramide (REGLAN) 10 MG tablet  Every 6 hours PRN     10/12/18 2223           Elnora Morrison, MD 10/12/18 2224

## 2018-10-12 NOTE — ED Notes (Signed)
unable to tolerate fluids. Pt vomiting, doctor notified. Verbal order given to restart IV and IV fluids.

## 2018-10-12 NOTE — ED Notes (Signed)
Patient transported to CT 

## 2018-10-12 NOTE — ED Triage Notes (Signed)
Pt states abdominal pain and diarrhea/vomiting today.

## 2018-10-12 NOTE — ED Notes (Signed)
pts "significant other" would like a call when pt is up for discharge [phone]

## 2018-10-12 NOTE — Discharge Instructions (Signed)
Use Reglan as needed for nausea vomiting. Gradually increase your fluid intake as tolerated. See a clinician for worsening or new symptoms.

## 2018-10-13 NOTE — ED Notes (Signed)
Upon discharge patient walked out in hallway. Asked if she wanted a wheelchair and she said "roll me out now!" Patient kept walking around hall even after told by this RN and tech to wait until tech returned with a wheelchair. Pt refused to put mask on

## 2018-10-13 NOTE — ED Notes (Signed)
Pt refusing discharge vitals and esignature 

## 2018-12-02 ENCOUNTER — Other Ambulatory Visit: Payer: Self-pay

## 2018-12-02 ENCOUNTER — Emergency Department (HOSPITAL_COMMUNITY)
Admission: EM | Admit: 2018-12-02 | Discharge: 2018-12-02 | Disposition: A | Discharge status: Home / Self Care | Payer: Medicaid Other | Attending: Emergency Medicine | Admitting: Emergency Medicine

## 2018-12-02 ENCOUNTER — Emergency Department (HOSPITAL_COMMUNITY): Payer: Medicaid Other

## 2018-12-02 DIAGNOSIS — R112 Nausea with vomiting, unspecified: Secondary | ICD-10-CM | POA: Insufficient documentation

## 2018-12-02 DIAGNOSIS — R1084 Generalized abdominal pain: Secondary | ICD-10-CM | POA: Diagnosis not present

## 2018-12-02 DIAGNOSIS — R1110 Vomiting, unspecified: Secondary | ICD-10-CM | POA: Diagnosis present

## 2018-12-02 DIAGNOSIS — Z87891 Personal history of nicotine dependence: Secondary | ICD-10-CM | POA: Insufficient documentation

## 2018-12-02 DIAGNOSIS — Z79899 Other long term (current) drug therapy: Secondary | ICD-10-CM | POA: Diagnosis not present

## 2018-12-02 LAB — COMPREHENSIVE METABOLIC PANEL
ALT: 16 U/L (ref 0–44)
AST: 17 U/L (ref 15–41)
Albumin: 3.8 g/dL (ref 3.5–5.0)
Alkaline Phosphatase: 51 U/L (ref 38–126)
Anion gap: 12 (ref 5–15)
BUN: 10 mg/dL (ref 6–20)
CO2: 21 mmol/L — ABNORMAL LOW (ref 22–32)
Calcium: 9.4 mg/dL (ref 8.9–10.3)
Chloride: 105 mmol/L (ref 98–111)
Creatinine, Ser: 0.85 mg/dL (ref 0.44–1.00)
GFR calc Af Amer: >60 mL/min (ref >60)
GFR calc non Af Amer: >60 mL/min (ref >60)
Glucose, Bld: 126 mg/dL — ABNORMAL HIGH (ref 70–99)
Potassium: 3.3 mmol/L — ABNORMAL LOW (ref 3.5–5.1)
Sodium: 138 mmol/L (ref 135–145)
Total Bilirubin: 0.5 mg/dL (ref 0.3–1.2)
Total Protein: 8.2 g/dL — ABNORMAL HIGH (ref 6.5–8.1)

## 2018-12-02 LAB — CBC
HCT: 43.5 % (ref 36.0–46.0)
Hemoglobin: 13.7 g/dL (ref 12.0–15.0)
MCH: 25.5 pg — ABNORMAL LOW (ref 26.0–34.0)
MCHC: 31.5 g/dL (ref 30.0–36.0)
MCV: 81 fL (ref 80.0–100.0)
Platelets: 253 10*3/uL (ref 150–400)
RBC: 5.37 MIL/uL — ABNORMAL HIGH (ref 3.87–5.11)
RDW: 14.9 % (ref 11.5–15.5)
WBC: 12.3 10*3/uL — ABNORMAL HIGH (ref 4.0–10.5)
nRBC: 0 % (ref 0.0–0.2)

## 2018-12-02 LAB — URINALYSIS, ROUTINE W REFLEX MICROSCOPIC
Bacteria, UA: NONE SEEN
Bilirubin Urine: NEGATIVE
Glucose, UA: NEGATIVE mg/dL
Hgb urine dipstick: NEGATIVE
Ketones, ur: 80 mg/dL — AB
Leukocytes,Ua: NEGATIVE
Nitrite: NEGATIVE
Protein, ur: 30 mg/dL — AB
Specific Gravity, Urine: >1.046 — ABNORMAL HIGH (ref 1.005–1.030)
pH: 6 (ref 5.0–8.0)

## 2018-12-02 LAB — I-STAT BETA HCG BLOOD, ED (MC, WL, AP ONLY): I-stat hCG, quantitative: <5 m[IU]/mL (ref <5)

## 2018-12-02 LAB — LIPASE, BLOOD: Lipase: 21 U/L (ref 11–51)

## 2018-12-02 MED ORDER — SODIUM CHLORIDE 0.9 % IV BOLUS
1000.0000 mL | Freq: Once | INTRAVENOUS | Status: AC
  Administered 2018-12-02: 1000 mL via INTRAVENOUS

## 2018-12-02 MED ORDER — DICYCLOMINE HCL 10 MG PO CAPS
10.0000 mg | ORAL_CAPSULE | Freq: Once | ORAL | Status: AC
  Administered 2018-12-02: 10 mg via ORAL
  Filled 2018-12-02: qty 1

## 2018-12-02 MED ORDER — DIPHENHYDRAMINE HCL 50 MG/ML IJ SOLN
25.0000 mg | Freq: Once | INTRAMUSCULAR | Status: AC
  Administered 2018-12-02: 25 mg via INTRAVENOUS
  Filled 2018-12-02: qty 1

## 2018-12-02 MED ORDER — METOCLOPRAMIDE HCL 5 MG/ML IJ SOLN
10.0000 mg | Freq: Once | INTRAMUSCULAR | Status: AC
  Administered 2018-12-02: 10 mg via INTRAVENOUS
  Filled 2018-12-02: qty 2

## 2018-12-02 MED ORDER — IOHEXOL 300 MG/ML  SOLN
100.0000 mL | Freq: Once | INTRAMUSCULAR | Status: AC | PRN
  Administered 2018-12-02: 100 mL via INTRAVENOUS

## 2018-12-02 MED ORDER — SODIUM CHLORIDE 0.9% FLUSH: 3.0000 mL | Freq: Once | INTRAVENOUS | Status: DC

## 2018-12-02 MED ORDER — MORPHINE SULFATE (PF) 2 MG/ML IV SOLN
2.0000 mg | Freq: Once | INTRAVENOUS | Status: AC
  Administered 2018-12-02: 2 mg via INTRAVENOUS
  Filled 2018-12-02: qty 1

## 2018-12-02 NOTE — ED Provider Notes (Signed)
[facility] EMERGENCY DEPARTMENT Provider Note   CSN: [id number] Arrival date & time: 12/02/18  1403     History   Chief Complaint Chief Complaint  Patient presents with  . Emesis    HPI Jacqueline Shepherd is a 28 y.o. female who presents for evaluation of 3 days of denies abdominal pain, nausea/vomiting that has been ongoing for last 3 days.  Patient states that she has not been able to tolerate any p.o. over the last 3 days.  She states that initially, emesis was nonbloody, nonbilious but today, she started having episodes of hematemesis.  She estimates about 3 episodes.  No coffee-ground emesis.  She states that her abdomen hurts all over.  She states that nothing makes it better.  She has not had any urinary complaints and denies any fevers.  She states she ate [employer] before onset of symptoms.  She has not had any diarrhea Is been about 3 days since her last bowel movement.  She states nobody else in the house has similar symptoms.  She denies any chest pain, difficulty breathing, vaginal bleeding.     The history is provided by the patient.    Past Medical History:  Diagnosis Date  . Abdominal pain     Patient Active Problem List   Diagnosis Date Noted  . Chlamydia infection complicating pregnancy in second trimester 08/18/2018  . Rubella non-immune status, antepartum 08/18/2018  . Supervision of other normal pregnancy, antepartum 08/06/2018    Past Surgical History:  Procedure Laterality Date  . WISDOM TOOTH EXTRACTION       OB History    Gravida  1   Para      Term      Preterm      AB      Living        SAB      TAB      Ectopic      Multiple      Live Births               Home Medications    Prior to Admission medications   Medication Sig Start Date End Date Taking? Authorizing Provider  Blood Pressure KIT Check BP at home regularly large cuff z34.80 08/06/18   Shelly Bombard, MD  metoCLOPramide (REGLAN) 10 MG  tablet Take 1 tablet (10 mg total) by mouth every 6 (six) hours as needed for nausea (nausea/headache). 10/12/18   Elnora Morrison, MD  oxyCODONE-acetaminophen (PERCOCET/ROXICET) 5-325 MG tablet Take 1-2 tablets by mouth every 6 (six) hours as needed for severe pain. Patient not taking: Reported on 10/13/2018 08/07/18   Tresea Mall, CNM  promethazine (PHENERGAN) 25 MG tablet Take 0.5-1 tablets (12.5-25 mg total) by mouth every 6 (six) hours as needed for nausea or vomiting. Patient not taking: Reported on 10/13/2018 08/07/18   Tresea Mall, CNM  tinidazole [facility]) 500 MG tablet Take 2 tablets (1,000 mg total) by mouth daily with breakfast. Patient not taking: Reported on 10/13/2018 08/08/18   Shelly Bombard, MD    Family History Family History  Problem Relation Age of Onset  . Diabetes Mother   . Hypertension Mother     Social History Social History   Tobacco Use  . Smoking status: Former Smoker    Packs/day: 0.50  . Smokeless tobacco: Never Used  Substance Use Topics  . Alcohol use: No  . Drug use: No     Allergies   Ibuprofen and  Zofran   Review of Systems Review of Systems  Constitutional: Negative for fever.  Respiratory: Negative for cough and shortness of breath.   Cardiovascular: Negative for chest pain.  Gastrointestinal: Positive for abdominal pain, nausea and vomiting.  Genitourinary: Negative for dysuria and hematuria.  Neurological: Negative for headaches.  All other systems reviewed and are negative.    Physical Exam Updated Vital Signs BP 131/64 (BP Location: Left Arm)   Pulse 68   Temp 98.8 F (37.1 C) (Oral)   Resp 18   LMP 04/05/2018   SpO2 99%   Physical Exam Vitals signs and nursing note reviewed.  Constitutional:      Appearance: Normal appearance. [name] well-developed.     Comments: Appears uncomfortable   HENT:     Head: Normocephalic and atraumatic.  Eyes:     General: Lids are normal.     Conjunctiva/sclera: Conjunctivae  normal.     Pupils: Pupils are equal, round, and reactive to light.  Neck:     Musculoskeletal: Full passive range of motion without pain.  Cardiovascular:     Rate and Rhythm: Normal rate and regular rhythm.     Pulses: Normal pulses.     Heart sounds: Normal heart sounds. No murmur. No friction rub. No gallop.   Pulmonary:     Effort: Pulmonary effort is normal.     Breath sounds: Normal breath sounds.     Comments: Lungs clear to auscultation bilaterally.  Symmetric chest rise.  No wheezing, rales, rhonchi. Abdominal:     Palpations: Abdomen is soft. Abdomen is not rigid.     Tenderness: There is generalized abdominal tenderness. There is right CVA tenderness and left CVA tenderness. There is no guarding.     Comments: Abdomen is soft, distended.  Diffuse tenderness with no focal point.  No rigidity, guarding.  Bilateral CVA tenderness noted.  Musculoskeletal: Normal range of motion.  Skin:    General: Skin is warm and dry.     Capillary Refill: Capillary refill takes less than 2 seconds.  Neurological:     Mental Status: [name] alert and oriented to person, place, and time.  Psychiatric:        Speech: Speech normal.      ED Treatments / Results  Labs (all labs ordered are listed, but only abnormal results are displayed) Labs Reviewed  COMPREHENSIVE METABOLIC PANEL - Abnormal; Notable for the following components:      Result Value   Potassium 3.3 (*)    CO2 21 (*)    Glucose, Bld 126 (*)    Total Protein 8.2 (*)    All other components within normal limits  CBC - Abnormal; Notable for the following components:   WBC 12.3 (*)    RBC 5.37 (*)    MCH 25.5 (*)    All other components within normal limits  URINALYSIS, ROUTINE W REFLEX MICROSCOPIC - Abnormal; Notable for the following components:   Specific Gravity, Urine >1.046 (*)    Ketones, ur 80 (*)    Protein, ur 30 (*)    All other components within normal limits  LIPASE, BLOOD  I-STAT BETA HCG BLOOD, ED ([facility],  [facility], [facility] ONLY)    EKG None  Radiology Ct Abdomen Pelvis W Contrast  Result Date: 12/02/2018 CLINICAL DATA:  Abdominal pain, emesis EXAM: CT ABDOMEN AND PELVIS WITH CONTRAST TECHNIQUE: Multidetector CT imaging of the abdomen and pelvis was performed using the standard protocol following bolus administration of intravenous contrast. CONTRAST:  125m OMNIPAQUE IOHEXOL 300 MG/ML  SOLN COMPARISON:  10/12/2018 FINDINGS: Technical note: Patient motion artifact slightly degrades examination quality. Lower chest: No acute abnormality. Hepatobiliary: No focal liver abnormality is seen. No gallstones, gallbladder wall thickening, or biliary dilatation. Pancreas: Unremarkable. No pancreatic ductal dilatation or surrounding inflammatory changes. Spleen: Normal in size without focal abnormality. Adrenals/Urinary Tract: Adrenal glands are unremarkable. Kidneys are normal, without renal calculi, focal lesion, or hydronephrosis. Bladder is unremarkable. Stomach/Bowel: Stomach is within normal limits. Appendix appears normal. No evidence of bowel wall thickening, distention, or inflammatory changes. Vascular/Lymphatic: No significant vascular findings are present. Incidental note of retroaortic left renal vein. No enlarged abdominal or pelvic lymph nodes. Reproductive: Uterus and bilateral adnexa are unremarkable. Other: No abdominal wall hernia or abnormality. No abdominopelvic ascites. Musculoskeletal: No acute or significant osseous findings. IMPRESSION: No acute abdominopelvic findings. Electronically Signed   By: NDavina PokeM.D.   On: 12/02/2018 19:26    Procedures Procedures (including critical care time)  Medications Ordered in ED Medications  sodium chloride flush (NS) 0.9 % injection 3 mL (3 mLs Intravenous Not Given 12/02/18 1915)  sodium chloride 0.9 % bolus 1,000 mL (0 mLs Intravenous Stopped 12/02/18 2057)  metoCLOPramide (REGLAN) injection 10 mg (10 mg Intravenous Given 12/02/18 1700)   diphenhydrAMINE (BENADRYL) injection 25 mg (25 mg Intravenous Given 12/02/18 1700)  morphine 2 MG/ML injection 2 mg (2 mg Intravenous Given 12/02/18 1800)  iohexol (OMNIPAQUE) 300 MG/ML solution 100 mL (100 mLs Intravenous Contrast Given 12/02/18 1900)  dicyclomine (BENTYL) capsule 10 mg (10 mg Oral Given 12/02/18 2243)     Initial Impression / Assessment and Plan / ED Course  I have reviewed the triage vital signs and the nursing notes.  Pertinent labs & imaging results that were available during my care of the patient were reviewed by me and considered in my medical decision making (see chart for details).        28year old female who presents for evaluation of generalized abdominal pain, nausea/vomiting that began about 3 days ago.  Patient reports that initially vomit was nonbloody, nonbilious but since then has been having some episodes of bloody emesis.  No fevers.  Reports her last bowel movement was about 3 days ago.  Initial ED arrival, she is afebrile, nontoxic-appearing she appears uncomfortable but no acute distress.  She has generalized tenderness throughout the entire abdomen.  No focal point.  No CVA tenderness noted bilaterally.  Plan check labs.  CBC shows leukocytosis of 12.3, hemoglobin of 13.7.  CMP shows potassium of 3.3.  BUN and creatinine within normal limits.  I-STAT beta negative.  Lipase unremarkable.  CT abdomen pelvis reviewed.  No evidence of acute abdominal abnormality.  I have walked by the room several times the patient has been resting comfortably with no signs of distress.  [name] sleeping and does not appear to be in any pain.  [name] hemodynamically stable.  When I reevaluate the patient, she does state that she continues have pain.  She additionally has not had no episodes of vomiting while being here in the ED.  I extensively reviewed her chart.  She has been seen throughout several 80s for multiple same symptoms.  [facility] but left prior to completion of treatment.  Prior to that, she had had several episodes of ED visits for abdominal pain.  I discussed work-up and results with patient.  She states she continues to have pain.  She does  express pain when I palpate her abdomen but her abdomen is soft, nondistended with no rigidity or guarding.  [name] distractible and will converse with me while I palpate on her abdomen.  [name] asking for water.  We will plan to p.o. challenge.  Patient able tolerate p.o. without any difficulty.  Vitals are stable.  Given her reassuring work-up, do not feel that this is acute intra-abdominal abnormality.  Additionally, do not suspect pelvic/ovarian etiology.  She has no focal tenderness noted to lower abdomen that would be concerning for pelvic etiology.  I suspect this is ongoing chronic abdominal pain. At this time, patient exhibits no emergent life-threatening condition that require further evaluation in ED or admission. Patient had ample opportunity for questions and discussion. All patient's questions were answered with full understanding. Strict return precautions discussed. Patient expresses understanding and agreement to plan.   Portions of this note were generated with [employer]. Dictation errors may occur despite best attempts at proofreading.  Final Clinical Impressions(s) / ED Diagnoses   Final diagnoses:  Generalized abdominal pain  Non-intractable vomiting with nausea, unspecified vomiting type    ED Discharge Orders    None       Desma Mcgregor 12/03/18 0008    Davonna Belling, MD 12/05/18 [phone]

## 2018-12-02 NOTE — ED Notes (Signed)
Patient verbalizes understanding of discharge instructions. Opportunity for questioning and answers were provided. Armband removed by staff, pt discharged from ED.  

## 2018-12-02 NOTE — ED Triage Notes (Signed)
Pt arrives with S.O who reports constant emesis since eating [employer] 2 nights ago. Pt states unable to keep anything down.

## 2018-12-02 NOTE — Discharge Instructions (Signed)
Follow-up with [facility] to establish a primary care doctor if you do not have one.  ° °Return to the Emergency Department immediately if you experience any worsening abdominal pain, fever, persistent nausea and vomiting, inability keep any food down, pain with urination, blood in your urine or any other worsening or concerning symptoms. °

## 2019-01-18 ENCOUNTER — Emergency Department (HOSPITAL_COMMUNITY)
Admission: EM | Admit: 2019-01-18 | Discharge: 2019-01-18 | Discharge status: Against Medical Advice | Payer: Medicaid Other | Source: Home / Self Care

## 2019-01-18 ENCOUNTER — Other Ambulatory Visit: Payer: Self-pay

## 2019-01-18 ENCOUNTER — Encounter (HOSPITAL_COMMUNITY): Payer: Self-pay | Admitting: Emergency Medicine

## 2019-01-18 ENCOUNTER — Emergency Department (HOSPITAL_COMMUNITY)
Admission: EM | Admit: 2019-01-18 | Discharge: 2019-01-18 | Discharge status: Against Medical Advice | Payer: Medicaid Other | Attending: Emergency Medicine | Admitting: Emergency Medicine

## 2019-01-18 DIAGNOSIS — Z5321 Procedure and treatment not carried out due to patient leaving prior to being seen by health care provider: Secondary | ICD-10-CM | POA: Insufficient documentation

## 2019-01-18 DIAGNOSIS — R1110 Vomiting, unspecified: Secondary | ICD-10-CM | POA: Diagnosis present

## 2019-01-18 LAB — I-STAT BETA HCG BLOOD, ED (MC, WL, AP ONLY): I-stat hCG, quantitative: <5 m[IU]/mL (ref <5)

## 2019-01-18 MED ORDER — SODIUM CHLORIDE 0.9% FLUSH: 3.0000 mL | Freq: Once | INTRAVENOUS | Status: DC

## 2019-01-18 NOTE — ED Notes (Signed)
Pt relative came and pick her up. She decided to leave

## 2019-01-18 NOTE — ED Notes (Signed)
Multiple attempts by 2 phlebotomist to obtain labs this morning and was only able to get POC testing.

## 2019-01-18 NOTE — ED Notes (Signed)
Relative pick up patient in lobby, pt decided not to wait

## 2019-01-18 NOTE — ED Triage Notes (Signed)
C/o generalized abd pain, vomiting, chills, and headache since yesterday.

## 2019-01-18 NOTE — ED Triage Notes (Signed)
Pt reports generalized abd pain, vomiting, chills, and headache since yesterday.  PT came this morning and left due to wait.

## 2019-03-12 ENCOUNTER — Emergency Department (HOSPITAL_BASED_OUTPATIENT_CLINIC_OR_DEPARTMENT_OTHER)
Admission: EM | Admit: 2019-03-12 | Discharge: 2019-03-12 | Disposition: A | Discharge status: Home / Self Care | Payer: Medicaid Other | Attending: Emergency Medicine | Admitting: Emergency Medicine

## 2019-03-12 ENCOUNTER — Other Ambulatory Visit: Payer: Self-pay

## 2019-03-12 ENCOUNTER — Emergency Department (HOSPITAL_BASED_OUTPATIENT_CLINIC_OR_DEPARTMENT_OTHER): Payer: Medicaid Other

## 2019-03-12 ENCOUNTER — Encounter (HOSPITAL_BASED_OUTPATIENT_CLINIC_OR_DEPARTMENT_OTHER): Payer: Self-pay | Admitting: *Deleted

## 2019-03-12 DIAGNOSIS — R112 Nausea with vomiting, unspecified: Secondary | ICD-10-CM | POA: Diagnosis present

## 2019-03-12 DIAGNOSIS — Z87891 Personal history of nicotine dependence: Secondary | ICD-10-CM | POA: Insufficient documentation

## 2019-03-12 DIAGNOSIS — R109 Unspecified abdominal pain: Secondary | ICD-10-CM | POA: Diagnosis not present

## 2019-03-12 DIAGNOSIS — Z888 Allergy status to other drugs, medicaments and biological substances status: Secondary | ICD-10-CM | POA: Diagnosis not present

## 2019-03-12 DIAGNOSIS — Z886 Allergy status to analgesic agent status: Secondary | ICD-10-CM | POA: Diagnosis not present

## 2019-03-12 LAB — COMPREHENSIVE METABOLIC PANEL
ALT: 17 U/L (ref 0–44)
AST: 19 U/L (ref 15–41)
Albumin: 4.3 g/dL (ref 3.5–5.0)
Alkaline Phosphatase: 54 U/L (ref 38–126)
Anion gap: 13 (ref 5–15)
BUN: 17 mg/dL (ref 6–20)
CO2: 22 mmol/L (ref 22–32)
Calcium: 9.8 mg/dL (ref 8.9–10.3)
Chloride: 106 mmol/L (ref 98–111)
Creatinine, Ser: 0.89 mg/dL (ref 0.44–1.00)
GFR calc Af Amer: >60 mL/min (ref >60)
GFR calc non Af Amer: >60 mL/min (ref >60)
Glucose, Bld: 133 mg/dL — ABNORMAL HIGH (ref 70–99)
Potassium: 3.1 mmol/L — ABNORMAL LOW (ref 3.5–5.1)
Sodium: 141 mmol/L (ref 135–145)
Total Bilirubin: 0.8 mg/dL (ref 0.3–1.2)
Total Protein: 8.5 g/dL — ABNORMAL HIGH (ref 6.5–8.1)

## 2019-03-12 LAB — CBC WITH DIFFERENTIAL/PLATELET
Abs Immature Granulocytes: 0.05 10*3/uL (ref 0.00–0.07)
Basophils Absolute: 0 10*3/uL (ref 0.0–0.1)
Basophils Relative: 0 %
Eosinophils Absolute: 0 10*3/uL (ref 0.0–0.5)
Eosinophils Relative: 0 %
HCT: 41.8 % (ref 36.0–46.0)
Hemoglobin: 13.9 g/dL (ref 12.0–15.0)
Immature Granulocytes: 0 %
Lymphocytes Relative: 10 %
Lymphs Abs: 1.3 10*3/uL (ref 0.7–4.0)
MCH: 26.5 pg (ref 26.0–34.0)
MCHC: 33.3 g/dL (ref 30.0–36.0)
MCV: 79.8 fL — ABNORMAL LOW (ref 80.0–100.0)
Monocytes Absolute: 1 10*3/uL (ref 0.1–1.0)
Monocytes Relative: 8 %
Neutro Abs: 11.1 10*3/uL — ABNORMAL HIGH (ref 1.7–7.7)
Neutrophils Relative %: 82 %
Platelets: 244 10*3/uL (ref 150–400)
RBC: 5.24 MIL/uL — ABNORMAL HIGH (ref 3.87–5.11)
RDW: 14.4 % (ref 11.5–15.5)
WBC: 13.5 10*3/uL — ABNORMAL HIGH (ref 4.0–10.5)
nRBC: 0 % (ref 0.0–0.2)

## 2019-03-12 LAB — URINALYSIS, MICROSCOPIC (REFLEX)

## 2019-03-12 LAB — URINALYSIS, ROUTINE W REFLEX MICROSCOPIC
Glucose, UA: NEGATIVE mg/dL
Hgb urine dipstick: NEGATIVE
Ketones, ur: >80 mg/dL — AB
Leukocytes,Ua: NEGATIVE
Nitrite: NEGATIVE
Protein, ur: 100 mg/dL — AB
Specific Gravity, Urine: >1.03 — ABNORMAL HIGH (ref 1.005–1.030)
pH: 6 (ref 5.0–8.0)

## 2019-03-12 LAB — RAPID URINE DRUG SCREEN, HOSP PERFORMED
Amphetamines: NOT DETECTED
Barbiturates: NOT DETECTED
Benzodiazepines: NOT DETECTED
Cocaine: NOT DETECTED
Opiates: POSITIVE — AB
Tetrahydrocannabinol: POSITIVE — AB

## 2019-03-12 LAB — PREGNANCY, URINE: Preg Test, Ur: NEGATIVE

## 2019-03-12 LAB — LIPASE, BLOOD: Lipase: 17 U/L (ref 11–51)

## 2019-03-12 MED ORDER — MORPHINE SULFATE (PF) 4 MG/ML IV SOLN
4.0000 mg | Freq: Once | INTRAVENOUS | Status: AC
  Administered 2019-03-12: 4 mg via INTRAVENOUS
  Filled 2019-03-12: qty 1

## 2019-03-12 MED ORDER — PROMETHAZINE HCL 25 MG/ML IJ SOLN
12.5000 mg | Freq: Once | INTRAMUSCULAR | Status: AC
  Administered 2019-03-12: 12.5 mg via INTRAVENOUS
  Filled 2019-03-12: qty 1

## 2019-03-12 MED ORDER — SODIUM CHLORIDE 0.9 % IV BOLUS (SEPSIS)
1000.0000 mL | Freq: Once | INTRAVENOUS | Status: AC
  Administered 2019-03-12: 1000 mL via INTRAVENOUS

## 2019-03-12 MED ORDER — ONDANSETRON HCL 4 MG PO TABS: 4.0000 mg | ORAL_TABLET | Freq: Four times a day (QID) | ORAL | 0 refills | Status: AC

## 2019-03-12 MED ORDER — IOHEXOL 300 MG/ML  SOLN
100.0000 mL | Freq: Once | INTRAMUSCULAR | Status: AC | PRN
  Administered 2019-03-12: 100 mL via INTRAVENOUS

## 2019-03-12 MED ORDER — DICYCLOMINE HCL 20 MG PO TABS: 20.0000 mg | ORAL_TABLET | Freq: Two times a day (BID) | ORAL | 0 refills | Status: DC

## 2019-03-12 NOTE — ED Provider Notes (Signed)
Medical screening examination/treatment/procedure(s) were conducted as a shared visit with non-physician practitioner(s) and myself.  I personally evaluated the patient during the encounter.    28yF with n/v. Seen previously for similar symptoms. Continues to use marijuana. Labs reasonable. Symptoms improved after meds and IVF. I doubt emergent process.   EMERGENCY DEPARTMENT  US GUIDANCE EXAM Emergency Ultrasound:  US Guidance for Needle Guidance  INDICATIONS: Difficult vascular access Linear probe used in real-time to visualize location of needle entry through skin.   PERFORMED BY: Myself IMAGES ARCHIVED?: No LIMITATIONS: None VIEWS USED: Transverse INTERPRETATION: Needle visualized within vein, Right arm and Needle gauge 20   Raeford Razor, MD 03/14/19 1554

## 2019-03-12 NOTE — ED Triage Notes (Signed)
Pt states has been having nausea and vomiting for the past 3 days

## 2019-03-12 NOTE — ED Provider Notes (Signed)
[facility] EMERGENCY DEPARTMENT Provider Note   CSN: [id number] Arrival date & time: 03/12/19  7106     History Chief Complaint  Patient presents with  . Emesis    Jacqueline Shepherd is a 29 y.o. female.  Patient is a 29 year old female with no significant past medical history presenting to the emergency department for abdominal pain nausea and vomiting.  Patient reports that this began 3 days ago.  Reports that she has not had a bowel movement or urinated in 3 days.  Reports she is unable to keep anything down.  Reports the pain is worse on the left side of her stomach and is severe.  Denies any vaginal bleeding or vaginal discharge.  Denies any fevers or URI symptoms.  Denies any diarrhea.  Patient reports that she has had this happen multiple times in the past and reports that the cause is due to "stress".  Patient has had multiple work-ups in the past including multiple CT scans most recently in November 2020 as well as pelvic ultrasounds all of which have been negative.        Past Medical History:  Diagnosis Date  . Abdominal pain     Patient Active Problem List   Diagnosis Date Noted  . Chlamydia infection complicating pregnancy in second trimester 08/18/2018  . Rubella non-immune status, antepartum 08/18/2018  . Supervision of other normal pregnancy, antepartum 08/06/2018    Past Surgical History:  Procedure Laterality Date  . WISDOM TOOTH EXTRACTION       OB History    Gravida  1   Para      Term      Preterm      AB      Living        SAB      TAB      Ectopic      Multiple      Live Births              Family History  Problem Relation Age of Onset  . Diabetes Mother   . Hypertension Mother     Social History   Tobacco Use  . Smoking status: Former Smoker    Packs/day: 0.50  . Smokeless tobacco: Never Used  Substance Use Topics  . Alcohol use: No  . Drug use: No    Home Medications Prior to Admission  medications   Medication Sig Start Date End Date Taking? Authorizing Provider  Blood Pressure KIT Check BP at home regularly large cuff z34.80 08/06/18   Shelly Bombard, MD  dicyclomine (BENTYL) 20 MG tablet Take 1 tablet (20 mg total) by mouth 2 (two) times daily. 03/12/19   Alveria Apley, PA-C  metoCLOPramide (REGLAN) 10 MG tablet Take 1 tablet (10 mg total) by mouth every 6 (six) hours as needed for nausea (nausea/headache). 10/12/18   Elnora Morrison, MD  ondansetron (ZOFRAN) 4 MG tablet Take 1 tablet (4 mg total) by mouth every 6 (six) hours for 3 days. 03/12/19 03/15/19  Alveria Apley, PA-C  oxyCODONE-acetaminophen (PERCOCET/ROXICET) 5-325 MG tablet Take 1-2 tablets by mouth every 6 (six) hours as needed for severe pain. Patient not taking: Reported on 10/13/2018 08/07/18   Tresea Mall, CNM  promethazine (PHENERGAN) 25 MG tablet Take 0.5-1 tablets (12.5-25 mg total) by mouth every 6 (six) hours as needed for nausea or vomiting. Patient not taking: Reported on 10/13/2018 08/07/18   Tresea Mall, CNM  tinidazole (TINDAMAX) 500 MG tablet  Take 2 tablets (1,000 mg total) by mouth daily with breakfast. Patient not taking: Reported on 10/13/2018 08/08/18   Shelly Bombard, MD    Allergies    Ibuprofen and Zofran  Review of Systems   Review of Systems  Constitutional: Positive for appetite change. Negative for chills and fever.  HENT: Negative for sore throat.   Respiratory: Negative for cough and shortness of breath.   Gastrointestinal: Positive for abdominal pain, nausea and vomiting. Negative for diarrhea.  Genitourinary: Positive for decreased urine volume. Negative for difficulty urinating, dysuria, hematuria, menstrual problem, pelvic pain, urgency, vaginal bleeding and vaginal discharge.  Musculoskeletal: Negative for arthralgias and back pain.  Skin: Negative for rash.  Allergic/Immunologic: Negative for immunocompromised state.  Neurological: Negative for headaches.     Physical Exam Updated Vital Signs BP (!) 147/86 (BP Location: Left Arm)   Pulse (!) 52   Temp 99 F (37.2 C) (Oral)   Resp 16   Ht '5\' 6"'$  (1.676 m)   Wt 136.1 kg   LMP 04/05/2018   SpO2 100%   BMI 48.42 kg/m   Physical Exam Vitals and nursing note reviewed.  Constitutional:      Appearance: Normal appearance. [name] obese. [name] not toxic-appearing or diaphoretic.     Comments: Patient appears in pain and uncomfortable.   HENT:     Head: Normocephalic.     Mouth/Throat:     Mouth: Mucous membranes are dry.  Eyes:     Conjunctiva/sclera: Conjunctivae normal.  Pulmonary:     Effort: Pulmonary effort is normal.     Breath sounds: Normal breath sounds.  Abdominal:     General: Abdomen is protuberant. Bowel sounds are decreased.     Palpations: Abdomen is soft.     Tenderness: There is abdominal tenderness in the left upper quadrant and left lower quadrant. There is no right CVA tenderness, left CVA tenderness or guarding.  Skin:    General: Skin is dry.     Capillary Refill: Capillary refill takes less than 2 seconds.  Neurological:     Mental Status: [name] alert.  Psychiatric:        Mood and Affect: Mood normal.     ED Results / Procedures / Treatments   Labs (all labs ordered are listed, but only abnormal results are displayed) Labs Reviewed  CBC WITH DIFFERENTIAL/PLATELET - Abnormal; Notable for the following components:      Result Value   WBC 13.5 (*)    RBC 5.24 (*)    MCV 79.8 (*)    Neutro Abs 11.1 (*)    All other components within normal limits  COMPREHENSIVE METABOLIC PANEL - Abnormal; Notable for the following components:   Potassium 3.1 (*)    Glucose, Bld 133 (*)    Total Protein 8.5 (*)    All other components within normal limits  URINALYSIS, ROUTINE W REFLEX MICROSCOPIC - Abnormal; Notable for the following components:   APPearance CLOUDY (*)    Specific Gravity, Urine >1.030 (*)    Bilirubin Urine MODERATE (*)    Ketones, ur >80 (*)     Protein, ur 100 (*)    All other components within normal limits  RAPID URINE DRUG SCREEN, HOSP PERFORMED - Abnormal; Notable for the following components:   Opiates POSITIVE (*)    Tetrahydrocannabinol POSITIVE (*)    All other components within normal limits  URINALYSIS, MICROSCOPIC (REFLEX) - Abnormal; Notable for the following components:   Bacteria, UA FEW (*)  All other components within normal limits  URINE CULTURE  LIPASE, BLOOD  PREGNANCY, URINE    EKG None  Radiology CT ABDOMEN PELVIS WO CONTRAST  Result Date: 03/12/2019 CLINICAL DATA:  Nausea and vomiting EXAM: CT ABDOMEN AND PELVIS WITHOUT CONTRAST TECHNIQUE: Multidetector CT imaging of the abdomen and pelvis was performed following the standard protocol without oral or IV contrast. COMPARISON:  December 02, 2018 FINDINGS: Lower chest: Lung bases are clear. Hepatobiliary: No focal liver lesions are evident on this noncontrast enhanced study. Gallbladder wall is not appreciably thickened. There is no biliary duct dilatation. Pancreas: There is no pancreatic mass or inflammatory focus. Spleen: No splenic lesions are evident. Adrenals/Urinary Tract: Adrenals bilaterally appear normal. Kidneys bilaterally show no evident mass or hydronephrosis on either side. There is no demonstrable renal or ureteral calculus on either side. Urinary bladder is midline with wall thickness within normal limits. Stomach/Bowel: There is no appreciable bowel wall or mesenteric thickening. Terminal ileum appears normal. There is no evident bowel obstruction. There is no appreciable free air or portal venous air. Vascular/Lymphatic: There is no abdominal aortic aneurysm. No vascular lesions evident on this noncontrast enhanced study. There is a retroaortic left renal vein, an anatomic variant. Reproductive: Uterus is anteverted.  No evident pelvic mass. Other: Appendix appears normal. There is no evident abscess or ascites in the abdomen or pelvis.  Musculoskeletal: No blastic or lytic bone lesions. No intramuscular or abdominal wall lesions. IMPRESSION: 1. A cause for patient's symptoms has not been established with this study. 2. No bowel obstruction. No abscess in the abdomen or pelvis. Appendix appears normal. 3. No renal or ureteral calculus. No hydronephrosis. Urinary bladder wall thickness within normal limits. Electronically Signed   By: Lowella Grip III M.D.   On: 03/12/2019 12:58    Procedures Procedures (including critical care time)  Medications Ordered in ED Medications  morphine 4 MG/ML injection 4 mg (4 mg Intravenous Given 03/12/19 1033)  promethazine (PHENERGAN) injection 12.5 mg (12.5 mg Intravenous Given 03/12/19 1033)  iohexol (OMNIPAQUE) 300 MG/ML solution 100 mL (100 mLs Intravenous Contrast Given 03/12/19 1221)  sodium chloride 0.9 % bolus 1,000 mL (0 mLs Intravenous Stopped 03/12/19 1220)    ED Course  I have reviewed the triage vital signs and the nursing notes.  Pertinent labs & imaging results that were available during my care of the patient were reviewed by me and considered in my medical decision making (see chart for details).  Clinical Course as of Mar 12 1407  Wed Mar 12, 2019  1159 Patient with chronic abdominal pain presenting for the same x3 days with n/v. Workup reassuring with negative CT scan and largely unremarkable labs. She does have +THC in urine and this has thought to be the cause of her emesis in the past. Patient was improved and sleeping after phenergan and morphine. No vomiting during her 4.5 hour stay in this ED.  In the past she has been referred to the GI multiple times but has failed to follow-up. Unknown reason why. Will refer her again. Patient advised to quit smoking THC. Will give rx for zofran and bentyl. Patient has listed allergy to zofran but patient states zofran actually helps her a lot and she does not have a reaction. Previously documented doses in the chart of IV zofran  without reaction reviewed by myself.    [KM]    Clinical Course User Index [KM] Kristine Royal   MDM Rules/Calculators/A&P  Based on review of vitals, medical screening exam, lab work and/or imaging, there does not appear to be an acute, emergent etiology for the patient's symptoms. Counseled pt on good return precautions and encouraged both PCP and ED follow-up as needed.  Prior to discharge, I also discussed incidental imaging findings with patient in detail and advised appropriate, recommended follow-up in detail.  Clinical Impression: 1. Non-intractable vomiting with nausea, unspecified vomiting type     Disposition: Discharge  Prior to providing a prescription for a controlled substance, I independently reviewed the patient's recent prescription history on the [place]. The patient had no recent or regular prescriptions and was deemed appropriate for a brief, less than 3 day prescription of narcotic for acute analgesia.  This note was prepared with assistance of Systems analyst. Occasional wrong-word or sound-a-like substitutions may have occurred due to the inherent limitations of voice recognition software.  Final Clinical Impression(s) / ED Diagnoses Final diagnoses:  Non-intractable vomiting with nausea, unspecified vomiting type    Rx / DC Orders ED Discharge Orders         Ordered    ondansetron (ZOFRAN) 4 MG tablet  Every 6 hours     03/12/19 1407    dicyclomine (BENTYL) 20 MG tablet  2 times daily     03/12/19 1407           Kristine Royal 03/12/19 1409    Virgel Manifold, MD 03/13/19 1116

## 2019-03-13 LAB — URINE CULTURE

## 2019-03-14 ENCOUNTER — Emergency Department (HOSPITAL_BASED_OUTPATIENT_CLINIC_OR_DEPARTMENT_OTHER)
Admission: EM | Admit: 2019-03-14 | Discharge: 2019-03-14 | Disposition: A | Discharge status: Home / Self Care | Payer: Medicaid Other | Attending: Emergency Medicine | Admitting: Emergency Medicine

## 2019-03-14 ENCOUNTER — Other Ambulatory Visit: Payer: Self-pay

## 2019-03-14 ENCOUNTER — Encounter (HOSPITAL_BASED_OUTPATIENT_CLINIC_OR_DEPARTMENT_OTHER): Payer: Self-pay | Admitting: Emergency Medicine

## 2019-03-14 DIAGNOSIS — Z20822 Contact with and (suspected) exposure to covid-19: Secondary | ICD-10-CM | POA: Insufficient documentation

## 2019-03-14 DIAGNOSIS — F12188 Cannabis abuse with other cannabis-induced disorder: Secondary | ICD-10-CM | POA: Insufficient documentation

## 2019-03-14 DIAGNOSIS — R1110 Vomiting, unspecified: Secondary | ICD-10-CM | POA: Diagnosis present

## 2019-03-14 LAB — CBC WITH DIFFERENTIAL/PLATELET
Abs Immature Granulocytes: 0.03 10*3/uL (ref 0.00–0.07)
Basophils Absolute: 0 10*3/uL (ref 0.0–0.1)
Basophils Relative: 0 %
Eosinophils Absolute: 0 10*3/uL (ref 0.0–0.5)
Eosinophils Relative: 0 %
HCT: 43.4 % (ref 36.0–46.0)
Hemoglobin: 14 g/dL (ref 12.0–15.0)
Immature Granulocytes: 0 %
Lymphocytes Relative: 20 %
Lymphs Abs: 1.9 10*3/uL (ref 0.7–4.0)
MCH: 26.4 pg (ref 26.0–34.0)
MCHC: 32.3 g/dL (ref 30.0–36.0)
MCV: 81.9 fL (ref 80.0–100.0)
Monocytes Absolute: 0.9 10*3/uL (ref 0.1–1.0)
Monocytes Relative: 9 %
Neutro Abs: 6.8 10*3/uL (ref 1.7–7.7)
Neutrophils Relative %: 71 %
Platelets: 235 10*3/uL (ref 150–400)
RBC: 5.3 MIL/uL — ABNORMAL HIGH (ref 3.87–5.11)
RDW: 14.5 % (ref 11.5–15.5)
WBC: 9.7 10*3/uL (ref 4.0–10.5)
nRBC: 0 % (ref 0.0–0.2)

## 2019-03-14 LAB — BASIC METABOLIC PANEL
Anion gap: 10 (ref 5–15)
BUN: 15 mg/dL (ref 6–20)
CO2: 24 mmol/L (ref 22–32)
Calcium: 8.6 mg/dL — ABNORMAL LOW (ref 8.9–10.3)
Chloride: 105 mmol/L (ref 98–111)
Creatinine, Ser: 0.93 mg/dL (ref 0.44–1.00)
GFR calc Af Amer: >60 mL/min (ref >60)
GFR calc non Af Amer: >60 mL/min (ref >60)
Glucose, Bld: 115 mg/dL — ABNORMAL HIGH (ref 70–99)
Potassium: 2.9 mmol/L — ABNORMAL LOW (ref 3.5–5.1)
Sodium: 139 mmol/L (ref 135–145)

## 2019-03-14 LAB — SARS CORONAVIRUS 2 (TAT 6-24 HRS): SARS Coronavirus 2: NEGATIVE

## 2019-03-14 MED ORDER — METOCLOPRAMIDE HCL 5 MG/ML IJ SOLN
10.0000 mg | Freq: Once | INTRAMUSCULAR | Status: AC
  Administered 2019-03-14: 10 mg via INTRAVENOUS
  Filled 2019-03-14: qty 2

## 2019-03-14 MED ORDER — PROMETHAZINE HCL 25 MG RE SUPP: 25.0000 mg | Freq: Four times a day (QID) | RECTAL | 0 refills | Status: DC | PRN

## 2019-03-14 MED ORDER — CAPSAICIN 0.075 % EX CREA
TOPICAL_CREAM | Freq: Three times a day (TID) | CUTANEOUS | Status: DC
  Administered 2019-03-14: 1 via TOPICAL
  Filled 2019-03-14: qty 60

## 2019-03-14 MED ORDER — POTASSIUM CHLORIDE 10 MEQ/100ML IV SOLN
10.0000 meq | Freq: Once | INTRAVENOUS | Status: AC
  Administered 2019-03-14: 10 meq via INTRAVENOUS
  Filled 2019-03-14: qty 100

## 2019-03-14 MED ORDER — DICYCLOMINE HCL 20 MG PO TABS: 20.0000 mg | ORAL_TABLET | Freq: Two times a day (BID) | ORAL | 0 refills | Status: DC

## 2019-03-14 MED ORDER — DIPHENHYDRAMINE HCL 50 MG/ML IJ SOLN
25.0000 mg | Freq: Once | INTRAMUSCULAR | Status: AC
  Administered 2019-03-14: 25 mg via INTRAVENOUS
  Filled 2019-03-14: qty 1

## 2019-03-14 MED ORDER — PROCHLORPERAZINE EDISYLATE 10 MG/2ML IJ SOLN
10.0000 mg | Freq: Once | INTRAMUSCULAR | Status: AC
  Administered 2019-03-14: 10 mg via INTRAVENOUS
  Filled 2019-03-14: qty 2

## 2019-03-14 MED ORDER — HALOPERIDOL LACTATE 5 MG/ML IJ SOLN
5.0000 mg | Freq: Once | INTRAMUSCULAR | Status: AC
  Administered 2019-03-14: 5 mg via INTRAVENOUS
  Filled 2019-03-14: qty 1

## 2019-03-14 MED ORDER — SODIUM CHLORIDE 0.9 % IV BOLUS
1000.0000 mL | Freq: Once | INTRAVENOUS | Status: AC
  Administered 2019-03-14: 1000 mL via INTRAVENOUS

## 2019-03-14 NOTE — ED Triage Notes (Signed)
Pt is/co nausea and vomiting x 5 days  Pt states she was given medication the last time she was here but she is unable to hold them down  Pt is c/o abd pain  Moaning and unable to hold still on stretcher

## 2019-03-14 NOTE — ED Notes (Signed)
Pt refuses any further applications of capsicum cream due to burning sensation.

## 2019-03-14 NOTE — ED Provider Notes (Signed)
[facility] DEPT Provider Note: Georgena Spurling, MD, FACEP  CSN: [id number] MRN: [id number] ARRIVAL: 03/14/19 at [place]: [place]  Vomiting   HISTORY OF PRESENT ILLNESS  03/14/19 4:00 AM Jacqueline Shepherd is a 29 y.o. female with a history of cannabis induced hyperemesis syndrome.  She was seen in this ED 2 days ago for vomiting and abdominal pain with constipation and decreased urine output.  She describes the pain as being primarily on the left side of her abdomen and severe in nature.  She has a history of previous episodes of the same with CTs and pelvic ultrasounds which have been negative.  Her work-up on that visit included a CT of the abdomen and pelvis which was unremarkable and laboratory studies which showed Dehydration, mild leukocytosis and mild hypokalemia.  Drug screen showed positive for opiates and THC.  She was discharged with prescriptions for Zofran and Bentyl.  She visited [facility] ED yesterday for similar complaint where again she had a CT of the abdomen and pelvis which was unremarkable, and laboratory studies which showed a mild leukocytosis and mild hypokalemia.  She returns with persistent vomiting and inability to hold down with the prescribed medications.  [name] having abdominal pain which she rates as a 10 out of 10.  The pain is located primarily on the left side and is worse with palpation or movement.  She admits to smoking "too much" marijuana and would like to stop, acknowledging it is likely the cause of her symptoms.   Past Medical History:  Diagnosis Date  . Abdominal pain     Past Surgical History:  Procedure Laterality Date  . WISDOM TOOTH EXTRACTION      Family History  Problem Relation Age of Onset  . Diabetes Mother   . Hypertension Mother     Social History   Tobacco Use  . Smoking status: Former Smoker    Packs/day: 0.50  . Smokeless tobacco: Never Used  Substance Use Topics  . Alcohol use: No   . Drug use: No    Comment: last used marijuana 5 days ago    Prior to Admission medications   Medication Sig Start Date End Date Taking? Authorizing Provider  Blood Pressure KIT Check BP at home regularly large cuff z34.80 08/06/18   Shelly Bombard, MD  dicyclomine (BENTYL) 20 MG tablet Take 1 tablet (20 mg total) by mouth 2 (two) times daily. 03/14/19   Dorie Rank, MD  metoCLOPramide (REGLAN) 10 MG tablet Take 1 tablet (10 mg total) by mouth every 6 (six) hours as needed for nausea (nausea/headache). 10/12/18   Elnora Morrison, MD  ondansetron (ZOFRAN) 4 MG tablet Take 1 tablet (4 mg total) by mouth every 6 (six) hours for 3 days. 03/12/19 03/15/19  Alveria Apley, PA-C  oxyCODONE-acetaminophen (PERCOCET/ROXICET) 5-325 MG tablet Take 1-2 tablets by mouth every 6 (six) hours as needed for severe pain. Patient not taking: Reported on 10/13/2018 08/07/18   Tresea Mall, CNM  promethazine (PHENERGAN) 25 MG suppository Place 1 suppository (25 mg total) rectally every 6 (six) hours as needed for nausea or vomiting. 03/14/19   Dorie Rank, MD  tinidazole (TINDAMAX) 500 MG tablet Take 2 tablets (1,000 mg total) by mouth daily with breakfast. Patient not taking: Reported on 10/13/2018 08/08/18   Shelly Bombard, MD    Allergies Ibuprofen   REVIEW OF SYSTEMS  Negative except as noted here or in the History of Present Illness.  PHYSICAL EXAMINATION  Initial Vital Signs Blood pressure (!) 151/90, pulse 63, temperature 97.9 F (36.6 C), temperature source Oral, resp. rate (!) 24, height _0  (1.676 m), weight (!) 156.5 kg, last menstrual period 02/12/2019, SpO2 100 %.  Examination General: Well-developed, obese female in no acute distress; appearance consistent with age of record HENT: normocephalic; atraumatic Eyes: pupils equal, round and reactive to light; extraocular muscles intact Neck: supple Heart: regular rate and rhythm Lungs: clear to auscultation bilaterally Abdomen: soft;  nondistended; left-sided tenderness; bowel sounds present Extremities: No deformity; full range of motion; pulses normal Neurologic: Awake, alert and oriented; motor function intact in all extremities and symmetric; no facial droop Skin: Warm and dry Psychiatric: Tearful   RESULTS  Summary of this visit's results, reviewed and interpreted by myself:   EKG Interpretation  Date/Time:  Friday March 14 2019 07:52:18 EST Ventricular Rate:  56 PR Interval:    QRS Duration: 108 QT Interval:  477 QTC Calculation: 461 R Axis:   82 Text Interpretation: Sinus rhythm RSR' in V1 or V2, probably normal variant No significant change since last tracing Confirmed by Dorie Rank [phone]) on 03/14/2019 7:56:47 AM      Laboratory Studies: Results for orders placed or performed during the hospital encounter of 03/14/19 (from the past 24 hour(s))  CBC with Differential/Platelet     Status: Abnormal   Collection Time: 03/14/19  6:37 AM  Result Value Ref Range   WBC 9.7 4.0 - 10.5 K/uL   RBC 5.30 (H) 3.87 - 5.11 MIL/uL   Hemoglobin 14.0 12.0 - 15.0 g/dL   HCT 43.4 36.0 - 46.0 %   MCV 81.9 80.0 - 100.0 fL   MCH 26.4 26.0 - 34.0 pg   MCHC 32.3 30.0 - 36.0 g/dL   RDW 14.5 11.5 - 15.5 %   Platelets 235 150 - 400 K/uL   nRBC 0.0 0.0 - 0.2 %   Neutrophils Relative % 71 %   Neutro Abs 6.8 1.7 - 7.7 K/uL   Lymphocytes Relative 20 %   Lymphs Abs 1.9 0.7 - 4.0 K/uL   Monocytes Relative 9 %   Monocytes Absolute 0.9 0.1 - 1.0 K/uL   Eosinophils Relative 0 %   Eosinophils Absolute 0.0 0.0 - 0.5 K/uL   Basophils Relative 0 %   Basophils Absolute 0.0 0.0 - 0.1 K/uL   Immature Granulocytes 0 %   Abs Immature Granulocytes 0.03 0.00 - 0.07 K/uL  Basic metabolic panel     Status: Abnormal   Collection Time: 03/14/19  6:37 AM  Result Value Ref Range   Sodium 139 135 - 145 mmol/L   Potassium 2.9 (L) 3.5 - 5.1 mmol/L   Chloride 105 98 - 111 mmol/L   CO2 24 22 - 32 mmol/L   Glucose, Bld 115 (H) 70 - 99  mg/dL   BUN 15 6 - 20 mg/dL   Creatinine, Ser 0.93 0.44 - 1.00 mg/dL   Calcium 8.6 (L) 8.9 - 10.3 mg/dL   GFR calc non Af Amer >60 >60 mL/min   GFR calc Af Amer >60 >60 mL/min   Anion gap 10 5 - 15  SARS CORONAVIRUS 2 (TAT 6-24 HRS) Nasopharyngeal Nasopharyngeal Swab     Status: None   Collection Time: 03/14/19  6:44 AM   Specimen: Nasopharyngeal Swab  Result Value Ref Range   SARS Coronavirus 2 NEGATIVE NEGATIVE   Imaging Studies: No results found.  ED COURSE and MDM  Nursing notes, initial and subsequent vitals  signs, including pulse oximetry, reviewed and interpreted by myself.  Vitals:   03/14/19 0615 03/14/19 0630 03/14/19 0735 03/14/19 1040  BP: (!) 144/98 (!) 154/99 (!) 163/87 (!) 152/83  Pulse: 82  86 68  Resp:   18 16  Temp:    98 F (36.7 C)  TempSrc:    Oral  SpO2: 100%  100% 99%  Weight:      Height:       Medications  capsicum (ZOSTRIX) 9.892 % cream (1 application Topical Given 03/14/19 0427)  sodium chloride 0.9 % bolus 1,000 mL (0 mLs Intravenous Stopped 03/14/19 0529)  haloperidol lactate (HALDOL) injection 5 mg (5 mg Intravenous Given 03/14/19 0425)  metoCLOPramide (REGLAN) injection 10 mg (10 mg Intravenous Given 03/14/19 0612)  diphenhydrAMINE (BENADRYL) injection 25 mg (25 mg Intravenous Given 03/14/19 0612)   Patient given IV fluid bolus and Haldol IV with topical capsicum cream for treatment of cannabis induced hyperemesis syndrome.  6:28 AM Patient complaining of return of abdominal pain and nausea after initial relief with original treatment.  Patient given Reglan and Benadryl IV.  7:00 AM Signed out to Dr. Dorie Rank.  PROCEDURES  Procedures   ED DIAGNOSES     ICD-10-CM   1. Cannabis hyperemesis syndrome concurrent with and due to cannabis abuse ([place])  F12.188        Shanon Rosser, MD 03/14/19 2243

## 2019-03-14 NOTE — ED Provider Notes (Signed)
Patient initially seen by Dr. Read Drivers.  Please see his note.  Patient has history of recurrent abdominal pain and vomiting.  Previously diagnosed with hyperemesis syndrome.  Patient had a CT scan of abdomen pelvis on February 17.  No acute findings were noted.  Patient was also seen at [facility] on February 18.  Patient had another CT scan of her abdomen pelvis as well as laboratory tests.  Her ED work-up was negative.  7:56 AM Still with pain.  Will try compazine.   11:18 AM patient has been resting.  No further vomiting.  Appears stable for discharge at this time.   EKG Interpretation  Date/Time:  Friday March 14 2019 07:52:18 EST Ventricular Rate:  56 PR Interval:    QRS Duration: 108 QT Interval:  477 QTC Calculation: 461 R Axis:   82 Text Interpretation: Sinus rhythm RSR' in V1 or V2, probably normal variant No significant change since last tracing Confirmed by Linwood Dibbles [phone]) on 03/14/2019 7:56:47 AM         Linwood Dibbles, MD 03/14/19 1118

## 2019-03-14 NOTE — ED Notes (Signed)
Pt sleeping upon entering room, easily rousable to voice

## 2019-03-14 NOTE — Discharge Instructions (Addendum)
Avoid using marijuana.  Take the medications to help with the abdominal cramping and pain.  Follow-up with your primary care doctor for further treatment or consider seeing a gastrointestinal specialist if symptoms persist

## 2019-03-20 ENCOUNTER — Ambulatory Visit: Payer: Medicaid Other | Admitting: Nurse Practitioner

## 2019-04-04 ENCOUNTER — Ambulatory Visit: Payer: Medicaid Other | Admitting: Nurse Practitioner

## 2019-04-04 ENCOUNTER — Other Ambulatory Visit: Payer: Self-pay

## 2019-04-04 ENCOUNTER — Emergency Department (HOSPITAL_COMMUNITY)
Admission: EM | Admit: 2019-04-04 | Discharge: 2019-04-04 | Disposition: A | Discharge status: Home / Self Care | Payer: Medicaid Other | Attending: Emergency Medicine | Admitting: Emergency Medicine

## 2019-04-04 ENCOUNTER — Encounter (HOSPITAL_COMMUNITY): Payer: Self-pay | Admitting: Emergency Medicine

## 2019-04-04 DIAGNOSIS — Z79899 Other long term (current) drug therapy: Secondary | ICD-10-CM | POA: Diagnosis not present

## 2019-04-04 DIAGNOSIS — R112 Nausea with vomiting, unspecified: Secondary | ICD-10-CM | POA: Diagnosis present

## 2019-04-04 DIAGNOSIS — E876 Hypokalemia: Secondary | ICD-10-CM

## 2019-04-04 DIAGNOSIS — Z87891 Personal history of nicotine dependence: Secondary | ICD-10-CM | POA: Diagnosis not present

## 2019-04-04 LAB — CBC WITH DIFFERENTIAL/PLATELET
Abs Immature Granulocytes: 0.05 10*3/uL (ref 0.00–0.07)
Basophils Absolute: 0 10*3/uL (ref 0.0–0.1)
Basophils Relative: 0 %
Eosinophils Absolute: 0 10*3/uL (ref 0.0–0.5)
Eosinophils Relative: 0 %
HCT: 47.9 % — ABNORMAL HIGH (ref 36.0–46.0)
Hemoglobin: 15.2 g/dL — ABNORMAL HIGH (ref 12.0–15.0)
Immature Granulocytes: 0 %
Lymphocytes Relative: 14 %
Lymphs Abs: 1.7 10*3/uL (ref 0.7–4.0)
MCH: 26.4 pg (ref 26.0–34.0)
MCHC: 31.7 g/dL (ref 30.0–36.0)
MCV: 83.3 fL (ref 80.0–100.0)
Monocytes Absolute: 0.8 10*3/uL (ref 0.1–1.0)
Monocytes Relative: 7 %
Neutro Abs: 9.7 10*3/uL — ABNORMAL HIGH (ref 1.7–7.7)
Neutrophils Relative %: 79 %
Platelets: ADEQUATE 10*3/uL (ref 150–400)
RBC: 5.75 MIL/uL — ABNORMAL HIGH (ref 3.87–5.11)
RDW: 15.6 % — ABNORMAL HIGH (ref 11.5–15.5)
WBC: 12.3 10*3/uL — ABNORMAL HIGH (ref 4.0–10.5)
nRBC: 0 % (ref 0.0–0.2)

## 2019-04-04 LAB — URINALYSIS, ROUTINE W REFLEX MICROSCOPIC
Bacteria, UA: NONE SEEN
Bilirubin Urine: NEGATIVE
Glucose, UA: NEGATIVE mg/dL
Hgb urine dipstick: NEGATIVE
Ketones, ur: 80 mg/dL — AB
Leukocytes,Ua: NEGATIVE
Nitrite: NEGATIVE
Protein, ur: 30 mg/dL — AB
Specific Gravity, Urine: 1.023 (ref 1.005–1.030)
pH: 6 (ref 5.0–8.0)

## 2019-04-04 LAB — LIPASE, BLOOD: Lipase: 16 U/L (ref 11–51)

## 2019-04-04 LAB — COMPREHENSIVE METABOLIC PANEL
ALT: 20 U/L (ref 0–44)
AST: 20 U/L (ref 15–41)
Albumin: 4 g/dL (ref 3.5–5.0)
Alkaline Phosphatase: 60 U/L (ref 38–126)
Anion gap: 14 (ref 5–15)
BUN: 15 mg/dL (ref 6–20)
CO2: 23 mmol/L (ref 22–32)
Calcium: 9.3 mg/dL (ref 8.9–10.3)
Chloride: 102 mmol/L (ref 98–111)
Creatinine, Ser: 0.75 mg/dL (ref 0.44–1.00)
GFR calc Af Amer: >60 mL/min (ref >60)
GFR calc non Af Amer: >60 mL/min (ref >60)
Glucose, Bld: 109 mg/dL — ABNORMAL HIGH (ref 70–99)
Potassium: 2.8 mmol/L — ABNORMAL LOW (ref 3.5–5.1)
Sodium: 139 mmol/L (ref 135–145)
Total Bilirubin: 0.9 mg/dL (ref 0.3–1.2)
Total Protein: 8.3 g/dL — ABNORMAL HIGH (ref 6.5–8.1)

## 2019-04-04 LAB — POC URINE PREG, ED: Preg Test, Ur: NEGATIVE

## 2019-04-04 MED ORDER — MAGNESIUM SULFATE 2 GM/50ML IV SOLN
2.0000 g | Freq: Once | INTRAVENOUS | Status: AC
  Administered 2019-04-04: 2 g via INTRAVENOUS
  Filled 2019-04-04: qty 50

## 2019-04-04 MED ORDER — DICYCLOMINE HCL 20 MG PO TABS: 20.0000 mg | ORAL_TABLET | Freq: Two times a day (BID) | ORAL | 0 refills | Status: DC

## 2019-04-04 MED ORDER — POTASSIUM CHLORIDE CRYS ER 20 MEQ PO TBCR: 40.0000 meq | EXTENDED_RELEASE_TABLET | Freq: Two times a day (BID) | ORAL | 0 refills | Status: DC

## 2019-04-04 MED ORDER — PANTOPRAZOLE SODIUM 40 MG IV SOLR
40.0000 mg | Freq: Once | INTRAVENOUS | Status: AC
  Administered 2019-04-04: 40 mg via INTRAVENOUS
  Filled 2019-04-04: qty 40

## 2019-04-04 MED ORDER — PROMETHAZINE HCL 25 MG RE SUPP: 25.0000 mg | Freq: Four times a day (QID) | RECTAL | 0 refills | Status: DC | PRN

## 2019-04-04 MED ORDER — LACTATED RINGERS IV BOLUS
1000.0000 mL | Freq: Once | INTRAVENOUS | Status: AC
  Administered 2019-04-04: 1000 mL via INTRAVENOUS

## 2019-04-04 MED ORDER — PROMETHAZINE HCL 25 MG/ML IJ SOLN
25.0000 mg | Freq: Once | INTRAMUSCULAR | Status: AC
  Administered 2019-04-04: 02:00:00 25 mg via INTRAVENOUS
  Filled 2019-04-04: qty 1

## 2019-04-04 MED ORDER — PROMETHAZINE HCL 25 MG PO TABS: 25.0000 mg | ORAL_TABLET | Freq: Four times a day (QID) | ORAL | 0 refills | Status: DC | PRN

## 2019-04-04 MED ORDER — PANTOPRAZOLE SODIUM 20 MG PO TBEC: 20.0000 mg | DELAYED_RELEASE_TABLET | Freq: Every day | ORAL | 0 refills | Status: DC

## 2019-04-04 MED ORDER — POTASSIUM CHLORIDE CRYS ER 20 MEQ PO TBCR
40.0000 meq | EXTENDED_RELEASE_TABLET | Freq: Once | ORAL | Status: AC
  Administered 2019-04-04: 40 meq via ORAL
  Filled 2019-04-04: qty 2

## 2019-04-04 MED ORDER — POTASSIUM CHLORIDE 10 MEQ/100ML IV SOLN
10.0000 meq | Freq: Once | INTRAVENOUS | Status: AC
  Administered 2019-04-04: 10 meq via INTRAVENOUS
  Filled 2019-04-04: qty 100

## 2019-04-04 NOTE — ED Triage Notes (Signed)
Patient states nausea and vomiting x 3 days. Patient states no bowel movement x 3 days. Patient states she is unable to keep food or liquids down.

## 2019-04-04 NOTE — ED Provider Notes (Signed)
Emergency Department Provider Note   I have reviewed the triage vital signs and the nursing notes.   HISTORY  Chief Complaint Emesis   HPI Jacqueline Shepherd is a 29 y.o. female who presents to the emergency department today secondary to vomiting.  Patient states that she has had 3-day history of not really keep anything down.  She states that she vomits every single time should eat or drink anything even though she tries to force her self to do it.  She states that she has some mild diarrhea whenever she throws up.  She states no fevers.  She states she is having abdominal pain now because of the vomiting.  On previous visit she has been here was been diagnosed with cannabinoid hyperemesis but she states no alcohol, drugs or tobacco in the last few days.  She has no known pancreatic or gallbladder issues.  Patient has no other known medical problems.   No other associated or modifying symptoms.    Past Medical History:  Diagnosis Date  . Abdominal pain     Patient Active Problem List   Diagnosis Date Noted  . Chlamydia infection complicating pregnancy in second trimester 08/18/2018  . Rubella non-immune status, antepartum 08/18/2018  . Supervision of other normal pregnancy, antepartum 08/06/2018    Past Surgical History:  Procedure Laterality Date  . WISDOM TOOTH EXTRACTION      Current Outpatient Rx  . Order #: [id number] Class: Normal  . Order #: [id number] Class: Print  . Order #: [id number] Class: Print  . Order #: [id number] Class: Print  . Order #: [id number] Class: Print  . Order #: [id number] Class: Print    Allergies Patient has no active allergies.  Family History  Problem Relation Age of Onset  . Diabetes Mother   . Hypertension Mother     Social History Social History   Tobacco Use  . Smoking status: Former Smoker    Packs/day: 0.50  . Smokeless tobacco: Never Used  Substance Use Topics  . Alcohol use: No  . Drug use: No    Comment: last used  marijuana 5 days ago    Review of Systems  All other systems negative except as documented in the HPI. All pertinent positives and negatives as reviewed in the HPI. ____________________________________________   PHYSICAL EXAM:  VITAL SIGNS: ED Triage Vitals  Enc Vitals Group     BP 04/04/19 0120 (!) 160/97     Pulse Rate 04/04/19 0120 (!) 57     Resp 04/04/19 0120 18     Temp 04/04/19 0120 98.3 F (36.8 C)     Temp Source 04/04/19 0120 Oral     SpO2 04/04/19 0120 100 %     Weight 04/04/19 0116 (!) 345 lb (156.5 kg)     Height 04/04/19 0116 5\' 6"  (1.676 m)    Constitutional: Alert and oriented. Well appearing and in no acute distress. Eyes: Conjunctivae are normal. PERRL. EOMI. Head: Atraumatic. Nose: No congestion/rhinnorhea. Mouth/Throat: Mucous membranes are moist.  Oropharynx non-erythematous. Neck: No stridor.  No meningeal signs.   Cardiovascular: Normal rate, regular rhythm. Good peripheral circulation. Grossly normal heart sounds.   Respiratory: Normal respiratory effort.  No retractions. Lungs CTAB. Gastrointestinal: Soft and diffusely tender but distractible. No distention.  Musculoskeletal: No lower extremity tenderness nor edema. No gross deformities of extremities. Neurologic:  Normal speech and language. No gross focal neurologic deficits are appreciated.  Skin:  Skin is warm, dry and intact. No rash noted.   ____________________________________________   Reva Bores (  all labs ordered are listed, but only abnormal results are displayed)  Labs Reviewed  CBC WITH DIFFERENTIAL/PLATELET - Abnormal; Notable for the following components:      Result Value   WBC 12.3 (*)    RBC 5.75 (*)    Hemoglobin 15.2 (*)    HCT 47.9 (*)    RDW 15.6 (*)    Neutro Abs 9.7 (*)    All other components within normal limits  URINALYSIS, ROUTINE W REFLEX MICROSCOPIC - Abnormal; Notable for the following components:   APPearance HAZY (*)    Ketones, ur 80 (*)    Protein, ur 30 (*)     All other components within normal limits  COMPREHENSIVE METABOLIC PANEL - Abnormal; Notable for the following components:   Potassium 2.8 (*)    Glucose, Bld 109 (*)    Total Protein 8.3 (*)    All other components within normal limits  LIPASE, BLOOD  POC URINE PREG, ED   ____________________________________________  EKG   EKG Interpretation  Date/Time:    Ventricular Rate:    PR Interval:    QRS Duration:   QT Interval:    QTC Calculation:   R Axis:     Text Interpretation:         ____________________________________________  RADIOLOGY  No results found.  ____________________________________________   PROCEDURES  Procedure(s) performed:   Procedures   ____________________________________________   INITIAL IMPRESSION / ASSESSMENT AND PLAN / ED COURSE  Patient does not clinically seem dehydrated however she does dry heave a couple times while was in the room.  Doubt she will tolerate p.o. at this time so we will initiate IV fluids and antiemetics.  Seems to be consistent with her cyclical vomiting.  Her abdomen is tender but distractible making intra-abdominal emergencies less likely.  Will screen with some labs.  Reevaluate for disposition no indication for imaging at this time.  Clinical Course as of Apr 03 549  Fri Apr 04, 2019  0402 Will replete  Potassium(!): 2.8 [JM]  0403 Likely heme concentrated from dehydation  WBC(!): 12.3 [JM]  0403 Not eating  Ketones, ur(!): 80 [JM]  0403 Is able to stay somewhat hydrated, kidneys working well  Specific Gravity, Urine: 1.023 [JM]    Clinical Course User Index [JM] Lotta Frankenfield, Barbara Cower, MD   reevalation multiple times and patient sleeping. No vomiting here in ER in nearly 5 hours. Can continue K repletion at home.   Pertinent labs & imaging results that were available during my care of the patient were reviewed by me and considered in my medical decision making (see chart for details).  A medical  screening exam was performed and I feel the patient has had an appropriate workup for their chief complaint at this time and likelihood of emergent condition existing is low. They have been counseled on decision, discharge, follow up and which symptoms necessitate immediate return to the emergency department. They or their family verbally stated understanding and agreement with plan and discharged in stable condition.   ____________________________________________  FINAL CLINICAL IMPRESSION(S) / ED DIAGNOSES  Final diagnoses:  Non-intractable vomiting with nausea, unspecified vomiting type  Hypokalemia     MEDICATIONS GIVEN DURING THIS VISIT:  Medications  lactated ringers bolus 1,000 mL (0 mLs Intravenous Hold 04/04/19 0450)  promethazine (PHENERGAN) injection 25 mg (25 mg Intravenous Given 04/04/19 0225)  pantoprazole (PROTONIX) injection 40 mg (40 mg Intravenous Given 04/04/19 0225)  potassium chloride 10 mEq in 100 mL IVPB (0 mEq Intravenous Hold  04/04/19 0450)  magnesium sulfate IVPB 2 g 50 mL (0 g Intravenous Hold 04/04/19 0450)  potassium chloride SA (KLOR-CON) CR tablet 40 mEq (40 mEq Oral Given 04/04/19 0449)     NEW OUTPATIENT MEDICATIONS STARTED DURING THIS VISIT:  New Prescriptions   PANTOPRAZOLE (PROTONIX) 20 MG TABLET    Take 1 tablet (20 mg total) by mouth daily.   POTASSIUM CHLORIDE SA (KLOR-CON) 20 MEQ TABLET    Take 2 tablets (40 mEq total) by mouth 2 (two) times daily.   PROMETHAZINE (PHENERGAN) 25 MG SUPPOSITORY    Place 1 suppository (25 mg total) rectally every 6 (six) hours as needed for nausea or vomiting. If not tolerating by mouth tablet   PROMETHAZINE (PHENERGAN) 25 MG TABLET    Take 1 tablet (25 mg total) by mouth every 6 (six) hours as needed for nausea or vomiting.    Note:  This note was prepared with assistance of Dragon voice recognition software. Occasional wrong-word or sound-a-like substitutions may have occurred due to the inherent limitations of voice  recognition software.   Maliaka Brasington, Barbara Cower, MD 04/04/19 [phone]

## 2019-04-04 NOTE — ED Notes (Signed)
All IVF held NO ACCESS

## 2019-04-04 NOTE — ED Notes (Signed)
Unable to obtain PIV access. Second RN to attempt

## 2019-04-18 ENCOUNTER — Other Ambulatory Visit: Payer: Self-pay

## 2019-04-18 ENCOUNTER — Emergency Department (HOSPITAL_BASED_OUTPATIENT_CLINIC_OR_DEPARTMENT_OTHER): Payer: Medicaid Other

## 2019-04-18 ENCOUNTER — Encounter (HOSPITAL_BASED_OUTPATIENT_CLINIC_OR_DEPARTMENT_OTHER): Payer: Self-pay

## 2019-04-18 ENCOUNTER — Emergency Department (HOSPITAL_BASED_OUTPATIENT_CLINIC_OR_DEPARTMENT_OTHER)
Admission: EM | Admit: 2019-04-18 | Discharge: 2019-04-18 | Disposition: A | Discharge status: Home / Self Care | Payer: Medicaid Other | Attending: Emergency Medicine | Admitting: Emergency Medicine

## 2019-04-18 DIAGNOSIS — Z79899 Other long term (current) drug therapy: Secondary | ICD-10-CM | POA: Diagnosis not present

## 2019-04-18 DIAGNOSIS — U071 COVID-19: Secondary | ICD-10-CM | POA: Diagnosis not present

## 2019-04-18 DIAGNOSIS — Z87891 Personal history of nicotine dependence: Secondary | ICD-10-CM | POA: Diagnosis not present

## 2019-04-18 DIAGNOSIS — R7989 Other specified abnormal findings of blood chemistry: Secondary | ICD-10-CM | POA: Insufficient documentation

## 2019-04-18 DIAGNOSIS — R05 Cough: Secondary | ICD-10-CM | POA: Diagnosis present

## 2019-04-18 LAB — CBC WITH DIFFERENTIAL/PLATELET
Abs Immature Granulocytes: 0 10*3/uL (ref 0.00–0.07)
Basophils Absolute: 0 10*3/uL (ref 0.0–0.1)
Basophils Relative: 0 %
Eosinophils Absolute: 0.1 10*3/uL (ref 0.0–0.5)
Eosinophils Relative: 1 %
HCT: 41.3 % (ref 36.0–46.0)
Hemoglobin: 13.1 g/dL (ref 12.0–15.0)
Immature Granulocytes: 0 %
Lymphocytes Relative: 46 %
Lymphs Abs: 1.9 10*3/uL (ref 0.7–4.0)
MCH: 26.3 pg (ref 26.0–34.0)
MCHC: 31.7 g/dL (ref 30.0–36.0)
MCV: 82.8 fL (ref 80.0–100.0)
Monocytes Absolute: 0.4 10*3/uL (ref 0.1–1.0)
Monocytes Relative: 11 %
Neutro Abs: 1.7 10*3/uL (ref 1.7–7.7)
Neutrophils Relative %: 42 %
Platelets: 151 10*3/uL (ref 150–400)
RBC: 4.99 MIL/uL (ref 3.87–5.11)
RDW: 14.6 % (ref 11.5–15.5)
WBC: 4.1 10*3/uL (ref 4.0–10.5)
nRBC: 0 % (ref 0.0–0.2)

## 2019-04-18 LAB — BASIC METABOLIC PANEL
Anion gap: 11 (ref 5–15)
BUN: 6 mg/dL (ref 6–20)
CO2: 21 mmol/L — ABNORMAL LOW (ref 22–32)
Calcium: 8.4 mg/dL — ABNORMAL LOW (ref 8.9–10.3)
Chloride: 108 mmol/L (ref 98–111)
Creatinine, Ser: 0.78 mg/dL (ref 0.44–1.00)
GFR calc Af Amer: >60 mL/min (ref >60)
GFR calc non Af Amer: >60 mL/min (ref >60)
Glucose, Bld: 97 mg/dL (ref 70–99)
Potassium: 3.6 mmol/L (ref 3.5–5.1)
Sodium: 140 mmol/L (ref 135–145)

## 2019-04-18 LAB — D-DIMER, QUANTITATIVE: D-Dimer, Quant: 0.99 ug/mL-FEU — ABNORMAL HIGH (ref 0.00–0.50)

## 2019-04-18 LAB — TROPONIN I (HIGH SENSITIVITY): Troponin I (High Sensitivity): 3 ng/L (ref <18)

## 2019-04-18 LAB — SARS CORONAVIRUS 2 AG (30 MIN TAT): SARS Coronavirus 2 Ag: POSITIVE — AB

## 2019-04-18 LAB — HCG, QUANTITATIVE, PREGNANCY: hCG, Beta Chain, Quant, S: <1 m[IU]/mL (ref <5)

## 2019-04-18 MED ORDER — IOHEXOL 350 MG/ML SOLN: 100.0000 mL | Freq: Once | INTRAVENOUS | Status: DC | PRN

## 2019-04-18 MED ORDER — SODIUM CHLORIDE 0.9 % IV BOLUS
1000.0000 mL | Freq: Once | INTRAVENOUS | Status: AC
  Administered 2019-04-18: 1000 mL via INTRAVENOUS

## 2019-04-18 MED ORDER — DEXAMETHASONE 6 MG PO TABS: 6.0000 mg | ORAL_TABLET | Freq: Every day | ORAL | 0 refills | Status: AC

## 2019-04-18 MED ORDER — DEXAMETHASONE SODIUM PHOSPHATE 10 MG/ML IJ SOLN
10.0000 mg | Freq: Once | INTRAMUSCULAR | Status: AC
  Administered 2019-04-18: 15:00:00 10 mg via INTRAVENOUS
  Filled 2019-04-18: qty 1

## 2019-04-18 MED ORDER — BENZONATATE 100 MG PO CAPS: 100.0000 mg | ORAL_CAPSULE | Freq: Three times a day (TID) | ORAL | 0 refills | Status: DC

## 2019-04-18 MED ORDER — FLUTICASONE PROPIONATE 50 MCG/ACT NA SUSP: 1.0000 | Freq: Every day | NASAL | 0 refills | Status: DC

## 2019-04-18 NOTE — ED Provider Notes (Signed)
[facility] EMERGENCY DEPARTMENT Provider Note   CSN: [id number] Arrival date & time: 04/18/19  1112    History Chief Complaint  Patient presents with  . Cough   Jacqueline Shepherd is a 29 y.o. female with past medical history significant for cannabinoid hyperemesis who presents for evaluation of chest pain.  Patient with pleuritic chest pain over last 3 days.  Patient states multiple episodes of hemoptysis.  Chest pain is nonexertional however is pleuritic.  No prior history of PE or DVT.  No oral OCPs, malignancy, recent surgery or immobilization.  Did have Covid exposure and significant other.  Not take anything for her symptoms.  Jacqueline Shepherd rates her chest pain a 9/10.  No associated diaphoresis, nausea, vomiting.  Admits to chills and body aches.  No fever, abdominal pain, diarrhea, dysuria.  No unilateral leg swelling, redness or warmth.  Denies additional aggravating or alleviating factors.  History obtained from patient and past medical records. No interpretor was used.  HPI     Past Medical History:  Diagnosis Date  . Abdominal pain     Patient Active Problem List   Diagnosis Date Noted  . Chlamydia infection complicating pregnancy in second trimester 08/18/2018  . Rubella non-immune status, antepartum 08/18/2018  . Supervision of other normal pregnancy, antepartum 08/06/2018    Past Surgical History:  Procedure Laterality Date  . WISDOM TOOTH EXTRACTION       OB History    Gravida  1   Para      Term      Preterm      AB      Living        SAB      TAB      Ectopic      Multiple      Live Births              Family History  Problem Relation Age of Onset  . Diabetes Mother   . Hypertension Mother     Social History   Tobacco Use  . Smoking status: Former Smoker    Packs/day: 0.50  . Smokeless tobacco: Never Used  Substance Use Topics  . Alcohol use: No  . Drug use: Not on file    Home Medications Prior to Admission  medications   Medication Sig Start Date End Date Taking? Authorizing Provider  benzonatate (TESSALON) 100 MG capsule Take 1 capsule (100 mg total) by mouth every 8 (eight) hours. 04/18/19   ,  A, PA-C  Blood Pressure KIT Check BP at home regularly large cuff z34.80 08/06/18   Harper, Charles A, MD  dexamethasone (DECADRON) 6 MG tablet Take 1 tablet (6 mg total) by mouth daily for 6 days. 04/18/19 04/24/19  ,  A, PA-C  dicyclomine (BENTYL) 20 MG tablet Take 1 tablet (20 mg total) by mouth 2 (two) times daily. 04/04/19   Mesner, Jason, MD  fluticasone (FLONASE) 50 MCG/ACT nasal spray Place 1 spray into both nostrils daily. 04/18/19   ,  A, PA-C  pantoprazole (PROTONIX) 20 MG tablet Take 1 tablet (20 mg total) by mouth daily. 04/04/19   Mesner, Jason, MD  potassium chloride SA (KLOR-CON) 20 MEQ tablet Take 2 tablets (40 mEq total) by mouth 2 (two) times daily. 04/04/19   Mesner, Jason, MD  promethazine (PHENERGAN) 25 MG suppository Place 1 suppository (25 mg total) rectally every 6 (six) hours as needed for nausea or vomiting. If not tolerating by mouth tablet 04/04/19     Mesner, Jason, MD  promethazine (PHENERGAN) 25 MG tablet Take 1 tablet (25 mg total) by mouth every 6 (six) hours as needed for nausea or vomiting. 04/04/19   Mesner, Jason, MD  metoCLOPramide (REGLAN) 10 MG tablet Take 1 tablet (10 mg total) by mouth every 6 (six) hours as needed for nausea (nausea/headache). 10/12/18 04/04/19  Zavitz, Joshua, MD    Allergies    Patient has no known allergies.  Review of Systems   Review of Systems  Constitutional: Positive for activity change, appetite change, chills and fatigue.  HENT: Negative.   Respiratory: Positive for cough and shortness of breath. Negative for choking and chest tightness.   Cardiovascular: Positive for chest pain. Negative for palpitations and leg swelling.  Gastrointestinal: Negative.   Genitourinary: Negative.   Musculoskeletal: Negative.    Skin: Negative.   Neurological: Positive for weakness (Generalized).  All other systems reviewed and are negative.   Physical Exam Updated Vital Signs BP (!) 133/95   Pulse 62   Temp 98.2 F (36.8 C) (Oral)   Resp (!) 23   Ht 5' 5" (1.651 m)   Wt 134.3 kg   LMP 04/15/2019   SpO2 96%   BMI 49.26 kg/m   Physical Exam Vitals and nursing note reviewed.  Constitutional:      General: Jacqueline Shepherd is not in acute distress.    Appearance: Jacqueline Shepherd is well-developed. Jacqueline Shepherd is not ill-appearing, toxic-appearing or diaphoretic.  HENT:     Head: Normocephalic and atraumatic.     Nose: Nose normal.     Mouth/Throat:     Mouth: Mucous membranes are moist.     Pharynx: Oropharynx is clear.  Eyes:     Pupils: Pupils are equal, round, and reactive to light.  Cardiovascular:     Rate and Rhythm: Normal rate.     Pulses: Normal pulses.     Heart sounds: Normal heart sounds.  Pulmonary:     Effort: Pulmonary effort is normal. No respiratory distress.     Breath sounds: Normal breath sounds.     Comments: Mild tachypnea to 22 Abdominal:     General: Bowel sounds are normal. There is no distension.     Tenderness: There is no abdominal tenderness. There is no right CVA tenderness, left CVA tenderness, guarding or rebound.  Musculoskeletal:        General: Normal range of motion.     Cervical back: Normal range of motion.     Comments: Compartments soft.  No bony tenderness.  Homans' sign negative  Skin:    General: Skin is warm and dry.     Capillary Refill: Capillary refill takes less than 2 seconds.     Comments: No edema, erythema or warmth.  Neurological:     General: No focal deficit present.     Mental Status: Jacqueline Shepherd is alert and oriented to person, place, and time.    ED Results / Procedures / Treatments   Labs (all labs ordered are listed, but only abnormal results are displayed) Labs Reviewed  SARS CORONAVIRUS 2 AG (30 MIN TAT) - Abnormal; Notable for the following components:       Result Value   SARS Coronavirus 2 Ag POSITIVE (*)    All other components within normal limits  BASIC METABOLIC PANEL - Abnormal; Notable for the following components:   CO2 21 (*)    Calcium 8.4 (*)    All other components within normal limits  D-DIMER, QUANTITATIVE (NOT AT [facility]) - Abnormal; Notable   for the following components:   D-Dimer, Quant 0.99 (*)    All other components within normal limits  CBC WITH DIFFERENTIAL/PLATELET  HCG, QUANTITATIVE, PREGNANCY  TROPONIN I (HIGH SENSITIVITY)  TROPONIN I (HIGH SENSITIVITY)    EKG None  Radiology DG Chest Portable 1 View  Result Date: 04/18/2019 CLINICAL DATA:  Cough with flu like symptoms EXAM: PORTABLE CHEST 1 VIEW COMPARISON:  Jun 22, 2010 FINDINGS: There is a small focus of airspace opacity in the right base. Lungs elsewhere clear. Heart size and pulmonary vascularity are normal. No adenopathy. No bone lesions. IMPRESSION: Small focus of suspected pneumonia right base. Lungs elsewhere clear. Cardiac silhouette within normal limits. Electronically Signed   By: William  Woodruff III M.D.   On: 04/18/2019 12:28    Procedures Ultrasound ED Peripheral IV (Provider)  Date/Time: 04/18/2019 3:27 PM Performed by: ,  A, PA-C Authorized by: ,  A, PA-C   Procedure details:    Indications: multiple failed IV attempts and poor IV access     Skin Prep: povidone-iodine     Location:  Left AC   Angiocath:  20 G   Bedside Ultrasound Guided: Yes     Images: not archived   Comments:     Unable to obtain IV access with US after multiple   (including critical care time)  Medications Ordered in ED Medications  iohexol (OMNIPAQUE) 350 MG/ML injection 100 mL (has no administration in time range)  sodium chloride 0.9 % bolus 1,000 mL (1,000 mLs Intravenous New Bag/Given 04/18/19 1413)  dexamethasone (DECADRON) injection 10 mg (10 mg Intravenous Given 04/18/19 1510)    ED Course  I have reviewed the triage vital  signs and the nursing notes.  Pertinent labs & imaging results that were available during my care of the patient were reviewed by me and considered in my medical decision making (see chart for details).  28-year-old female presents for evaluation of chest pain.  Jacqueline Shepherd is afebrile, nonseptic, non-ill-appearing.  Covid exposure and significant other.  Chest pain located to central chest and that is pleuritic in nature.  Associated hemoptysis.  No prior history of PE or DVT.  Jacqueline Shepherd is mildly tachypneic however no hypoxia.  Heart and lungs clear.  No evidence of DVT on exam.  Cannot PERC.  Given pleuritic chest pain, hemoptysis and mild tachypnea will order D-dimer.  We will also order Covid basic labs given exposure.  Labs and imaging personally reviewed and assessed. CBC without leukocytosis Metabolic panel with electrolyte, renal abnormality Covid positive Troponin WNL, Low suspicion for ACS with symptoms started 3 days ago.  With suspect elevated troponin is likely related to ACS. D-dimer elevated, will order CTA to rule out PE DG possible right lower lobe infiltrate EKG Sinus rhythm  Pregnancy test negative  Patient reassessed. Pain improved. Discussed elevated ddimer.  Unfortunately not able to obtain IV access large enough for CTA. I personally attempted with failure to obtain site assess.  I discussed transfer to a diffent hospital for IV team consult or VQ scan.  Patient declines transfer at this time.  Jacqueline Shepherd requests treatment for Covid and Jacqueline Shepherd will return if Jacqueline Shepherd has new or worsening symptoms.  Again discussed with patient with nursing in room cannot rule out blood clot.  Jacqueline Shepherd understands this.  We will treat Covid and have close outpatient follow-up.  Discussed patient with attending, Dr. Knapp.  He is agreeable with above treatment, plan disposition.  The patient has been appropriately medically screened and/or stabilized in the   ED. I have low suspicion for any other emergent medical condition  which would require further screening, evaluation or treatment in the ED or require inpatient management.  Patient is hemodynamically stable and in no acute distress.  Patient able to ambulate in department prior to ED.  Evaluation does not show acute pathology that would require ongoing or additional emergent interventions while in the emergency department or further inpatient treatment.  I have discussed the diagnosis with the patient and answered all questions.  Pain is been managed while in the emergency department and patient has no further complaints prior to discharge.  Patient is comfortable with plan discussed in room and is stable for discharge at this time.  I have discussed strict return precautions for returning to the emergency department.  Patient was encouraged to follow-up with PCP/specialist refer to at discharge.    MDM Rules/Calculators/A&P                     Jacqueline Shepherd was evaluated in Emergency Department on 04/18/2019 for the symptoms described in the history of present illness. Jacqueline Shepherd was evaluated in the context of the global COVID-19 pandemic, which necessitated consideration that the patient might be at risk for infection with the SARS-CoV-2 virus that causes COVID-19. Institutional protocols and algorithms that pertain to the evaluation of patients at risk for COVID-19 are in a state of rapid change based on information released by regulatory bodies including the CDC and federal and state organizations. These policies and algorithms were followed during the patient's care in the ED.   Final Clinical Impression(s) / ED Diagnoses Final diagnoses:  COVID-19  Elevated d-dimer    Rx / DC Orders ED Discharge Orders         Ordered    benzonatate (TESSALON) 100 MG capsule  Every 8 hours     04/18/19 1525    dexamethasone (DECADRON) 6 MG tablet  Daily     04/18/19 1525    fluticasone (FLONASE) 50 MCG/ACT nasal spray  Daily     04/18/19 1525           ,   A, PA-C 04/18/19 1530    Knapp, Jon, MD 04/19/19 0819  

## 2019-04-18 NOTE — ED Notes (Signed)
Attempted IV x 1; unable to obtain 

## 2019-04-18 NOTE — Discharge Instructions (Signed)
Take medications as prescribed.  Your D-dimer was elevated.  As discussed we cannot rule out a blood clot.  You do not want to be transferred to another hospital.  If you have worsening chest pain or shortness of breath please seek reevaluation at [facility] or [facility] is have the specialized machine that we are talking about previously  You will need to be out of work for 10 days from symptom onset.  I have written you out of work

## 2019-04-18 NOTE — ED Triage Notes (Signed)
Pt c/o flu like sx x 3 days with +covid exposure-NAD-steady gait 

## 2019-04-19 ENCOUNTER — Telehealth: Payer: Self-pay | Admitting: Adult Health

## 2019-04-19 NOTE — Telephone Encounter (Signed)
Called patient and LMOM about her eligibility to receive Monoclonal antibody infusion for her COVID diagnosis.  She appears to meet criteria based on her BMI.  Asked her to call us back so that we could discuss further.  She does not have my chart for me to send a message.  Lillard Anes, NP

## 2019-05-20 ENCOUNTER — Emergency Department (HOSPITAL_COMMUNITY)
Admission: EM | Admit: 2019-05-20 | Discharge: 2019-05-20 | Disposition: A | Discharge status: Home / Self Care | Payer: Medicaid Other | Attending: Emergency Medicine | Admitting: Emergency Medicine

## 2019-05-20 ENCOUNTER — Encounter (HOSPITAL_COMMUNITY): Payer: Self-pay

## 2019-05-20 ENCOUNTER — Other Ambulatory Visit: Payer: Self-pay

## 2019-05-20 DIAGNOSIS — R1084 Generalized abdominal pain: Secondary | ICD-10-CM | POA: Diagnosis not present

## 2019-05-20 DIAGNOSIS — R112 Nausea with vomiting, unspecified: Secondary | ICD-10-CM | POA: Diagnosis not present

## 2019-05-20 DIAGNOSIS — Z79899 Other long term (current) drug therapy: Secondary | ICD-10-CM | POA: Insufficient documentation

## 2019-05-20 DIAGNOSIS — Z87891 Personal history of nicotine dependence: Secondary | ICD-10-CM | POA: Insufficient documentation

## 2019-05-20 LAB — CBC
HCT: 42.5 % (ref 36.0–46.0)
Hemoglobin: 13.5 g/dL (ref 12.0–15.0)
MCH: 26.8 pg (ref 26.0–34.0)
MCHC: 31.8 g/dL (ref 30.0–36.0)
MCV: 84.3 fL (ref 80.0–100.0)
Platelets: 234 10*3/uL (ref 150–400)
RBC: 5.04 MIL/uL (ref 3.87–5.11)
RDW: 14.9 % (ref 11.5–15.5)
WBC: 13.6 10*3/uL — ABNORMAL HIGH (ref 4.0–10.5)
nRBC: 0 % (ref 0.0–0.2)

## 2019-05-20 LAB — COMPREHENSIVE METABOLIC PANEL
ALT: 15 U/L (ref 0–44)
AST: 18 U/L (ref 15–41)
Albumin: 3.7 g/dL (ref 3.5–5.0)
Alkaline Phosphatase: 59 U/L (ref 38–126)
Anion gap: 14 (ref 5–15)
BUN: 9 mg/dL (ref 6–20)
CO2: 18 mmol/L — ABNORMAL LOW (ref 22–32)
Calcium: 9.2 mg/dL (ref 8.9–10.3)
Chloride: 104 mmol/L (ref 98–111)
Creatinine, Ser: 0.68 mg/dL (ref 0.44–1.00)
GFR calc Af Amer: >60 mL/min (ref >60)
GFR calc non Af Amer: >60 mL/min (ref >60)
Glucose, Bld: 124 mg/dL — ABNORMAL HIGH (ref 70–99)
Potassium: 3.2 mmol/L — ABNORMAL LOW (ref 3.5–5.1)
Sodium: 136 mmol/L (ref 135–145)
Total Bilirubin: 0.8 mg/dL (ref 0.3–1.2)
Total Protein: 7.9 g/dL (ref 6.5–8.1)

## 2019-05-20 LAB — I-STAT BETA HCG BLOOD, ED (MC, WL, AP ONLY): I-stat hCG, quantitative: <5 m[IU]/mL (ref <5)

## 2019-05-20 LAB — LIPASE, BLOOD: Lipase: 21 U/L (ref 11–51)

## 2019-05-20 MED ORDER — FAMOTIDINE IN NACL 20-0.9 MG/50ML-% IV SOLN
20.0000 mg | Freq: Once | INTRAVENOUS | Status: AC
  Administered 2019-05-20: 20 mg via INTRAVENOUS
  Filled 2019-05-20: qty 50

## 2019-05-20 MED ORDER — HALOPERIDOL LACTATE 5 MG/ML IJ SOLN
2.5000 mg | Freq: Once | INTRAMUSCULAR | Status: AC
  Administered 2019-05-20: 2.5 mg via INTRAVENOUS
  Filled 2019-05-20: qty 1

## 2019-05-20 MED ORDER — SODIUM CHLORIDE 0.9 % IV BOLUS
1000.0000 mL | Freq: Once | INTRAVENOUS | Status: AC
  Administered 2019-05-20: 1000 mL via INTRAVENOUS

## 2019-05-20 MED ORDER — MORPHINE SULFATE (PF) 4 MG/ML IV SOLN
4.0000 mg | Freq: Once | INTRAVENOUS | Status: AC
  Administered 2019-05-20: 4 mg via INTRAVENOUS
  Filled 2019-05-20: qty 1

## 2019-05-20 MED ORDER — PROMETHAZINE HCL 25 MG/ML IJ SOLN
25.0000 mg | Freq: Once | INTRAMUSCULAR | Status: AC
  Administered 2019-05-20: 25 mg via INTRAVENOUS
  Filled 2019-05-20: qty 1

## 2019-05-20 MED ORDER — PROMETHAZINE HCL 25 MG PO TABS: 25.0000 mg | ORAL_TABLET | Freq: Four times a day (QID) | ORAL | 0 refills | Status: DC | PRN

## 2019-05-20 MED ORDER — SODIUM CHLORIDE 0.9% FLUSH: 3.0000 mL | Freq: Once | INTRAVENOUS | Status: DC

## 2019-05-20 NOTE — ED Provider Notes (Signed)
[facility] Provider Note   CSN: [id number] Arrival date & time: 05/20/19  1150     History Chief Complaint  Patient presents with  . Abdominal Pain  . Emesis    Jacqueline Shepherd is a 29 y.o. female.  Complaining of nausea vomiting diffuse abdominal pain with been going on for 3 days.  She has had this before and has been seen for hyperemesis due to cannabis.  She said she is still smoking.  Denies any fevers.  She said she has not urinated or stooled.  No blood from above or below.  No chest pain or shortness of breath.  The history is provided by the patient.  Abdominal Pain Pain location:  Generalized Pain quality: cramping   Pain radiates to:  Does not radiate Pain severity:  Moderate Onset quality:  Gradual Timing:  Intermittent Progression:  Unchanged Chronicity:  Recurrent Relieved by:  Nothing Worsened by:  Vomiting Ineffective treatments:  None tried Associated symptoms: constipation, nausea and vomiting   Associated symptoms: no chest pain, no chills, no cough, no diarrhea, no dysuria, no fever, no hematemesis, no hematochezia, no hematuria, no shortness of breath and no sore throat   Emesis Severity:  Severe Duration:  3 days Timing:  Intermittent Quality:  Stomach contents and bilious material Progression:  Unchanged Chronicity:  Recurrent Recent urination:  Absent Relieved by:  Nothing Worsened by:  Nothing Ineffective treatments:  None tried Associated symptoms: abdominal pain   Associated symptoms: no chills, no cough, no diarrhea, no fever and no sore throat   Risk factors: not pregnant and no sick contacts        Past Medical History:  Diagnosis Date  . Abdominal pain     Patient Active Problem List   Diagnosis Date Noted  . Chlamydia infection complicating pregnancy in second trimester 08/18/2018  . Rubella non-immune status, antepartum 08/18/2018  . Supervision of other normal pregnancy, antepartum  08/06/2018    Past Surgical History:  Procedure Laterality Date  . WISDOM TOOTH EXTRACTION       OB History    Gravida  1   Para      Term      Preterm      AB      Living        SAB      TAB      Ectopic      Multiple      Live Births              Family History  Problem Relation Age of Onset  . Diabetes Mother   . Hypertension Mother     Social History   Tobacco Use  . Smoking status: Former Smoker    Packs/day: 0.50  . Smokeless tobacco: Never Used  Substance Use Topics  . Alcohol use: No  . Drug use: Not on file    Home Medications Prior to Admission medications   Medication Sig Start Date End Date Taking? Authorizing Provider  benzonatate (TESSALON) 100 MG capsule Take 1 capsule (100 mg total) by mouth every 8 (eight) hours. 04/18/19   Henderly, Britni A, PA-C  Blood Pressure KIT Check BP at home regularly large cuff z34.80 08/06/18   Shelly Bombard, MD  dicyclomine (BENTYL) 20 MG tablet Take 1 tablet (20 mg total) by mouth 2 (two) times daily. 04/04/19   Mesner, Corene Cornea, MD  fluticasone (FLONASE) 50 MCG/ACT nasal spray Place 1 spray into both nostrils  daily. 04/18/19   Henderly, Britni A, PA-C  pantoprazole (PROTONIX) 20 MG tablet Take 1 tablet (20 mg total) by mouth daily. 04/04/19   Mesner, Corene Cornea, MD  potassium chloride SA (KLOR-CON) 20 MEQ tablet Take 2 tablets (40 mEq total) by mouth 2 (two) times daily. 04/04/19   Mesner, Corene Cornea, MD  promethazine (PHENERGAN) 25 MG suppository Place 1 suppository (25 mg total) rectally every 6 (six) hours as needed for nausea or vomiting. If not tolerating by mouth tablet 04/04/19   Mesner, Corene Cornea, MD  promethazine (PHENERGAN) 25 MG tablet Take 1 tablet (25 mg total) by mouth every 6 (six) hours as needed for nausea or vomiting. 04/04/19   Mesner, Corene Cornea, MD  metoCLOPramide (REGLAN) 10 MG tablet Take 1 tablet (10 mg total) by mouth every 6 (six) hours as needed for nausea (nausea/headache). 10/12/18 04/04/19  Elnora Morrison, MD    Allergies    Patient has no known allergies.  Review of Systems   Review of Systems  Constitutional: Negative for chills and fever.  HENT: Negative for sore throat.   Eyes: Negative for visual disturbance.  Respiratory: Negative for cough and shortness of breath.   Cardiovascular: Negative for chest pain.  Gastrointestinal: Positive for abdominal pain, constipation, nausea and vomiting. Negative for diarrhea, hematemesis and hematochezia.  Genitourinary: Negative for dysuria and hematuria.  Musculoskeletal: Negative for neck pain.  Skin: Negative for rash.  Neurological: Positive for light-headedness.    Physical Exam Updated Vital Signs BP (!) 155/86   Pulse 60   Temp 98.1 F (36.7 C) (Oral)   Resp 18   SpO2 100%   Physical Exam Vitals and nursing note reviewed.  Constitutional:      General: [name] not in acute distress.    Appearance: Normal appearance. [name] well-developed. [name] obese.  HENT:     Head: Normocephalic and atraumatic.  Eyes:     Conjunctiva/sclera: Conjunctivae normal.  Cardiovascular:     Rate and Rhythm: Normal rate and regular rhythm.     Pulses: Normal pulses.     Heart sounds: No murmur.  Pulmonary:     Effort: Pulmonary effort is normal. No respiratory distress.     Breath sounds: Normal breath sounds.  Abdominal:     Palpations: Abdomen is soft.     Tenderness: There is abdominal tenderness (generalized). There is no guarding or rebound.  Musculoskeletal:        General: No deformity or signs of injury. Normal range of motion.     Cervical back: Neck supple.  Skin:    General: Skin is warm and dry.     Capillary Refill: Capillary refill takes less than 2 seconds.  Neurological:     General: No focal deficit present.     Mental Status: [name] alert.     ED Results / Procedures / Treatments   Labs (all labs ordered are listed, but only abnormal results are displayed) Labs Reviewed  COMPREHENSIVE METABOLIC PANEL -  Abnormal; Notable for the following components:      Result Value   Potassium 3.2 (*)    CO2 18 (*)    Glucose, Bld 124 (*)    All other components within normal limits  CBC - Abnormal; Notable for the following components:   WBC 13.6 (*)    All other components within normal limits  LIPASE, BLOOD  URINALYSIS, ROUTINE W REFLEX MICROSCOPIC  I-STAT BETA HCG BLOOD, ED ([facility], [facility], [facility] ONLY)    EKG None  Radiology No results found.  Procedures Procedures (including critical care time)  Medications Ordered in ED Medications  sodium chloride 0.9 % bolus 1,000 mL (0 mLs Intravenous Stopped 05/20/19 1808)  promethazine (PHENERGAN) injection 25 mg (25 mg Intravenous Given 05/20/19 1515)  famotidine (PEPCID) IVPB 20 mg premix (0 mg Intravenous Stopped 05/20/19 1638)  morphine 4 MG/ML injection 4 mg (4 mg Intravenous Given 05/20/19 1653)  haloperidol lactate (HALDOL) injection 2.5 mg (2.5 mg Intravenous Given 05/20/19 1815)    ED Course  I have reviewed the triage vital signs and the nursing notes.  Pertinent labs & imaging results that were available during my care of the patient were reviewed by me and considered in my medical decision making (see chart for details).  Clinical Course as of May 20 1025  Tue May 20, 2019  1808 Tried to p.o. challenge and she said she threw it up.  She seems to be resting very comfortably in the bed.  We will dose her for some Haldol.   [MB]  2124 Patient finally holding down fluids and feels okay for discharge.   [MB]    Clinical Course User Index [MB] Hayden Rasmussen, MD   MDM Rules/Calculators/A&P                     This patient complains of nausea vomiting abdominal pain; this involves an extensive number of treatment Options and is a complaint that carries with it a high risk of complications and Morbidity. The differential includes gastroenteritis, gastritis, hyperemesis due to cannabis, obstruction, dehydration, metabolic derangement  I  ordered, reviewed and interpreted labs, which included elevated white count at 13.6, normal hemoglobin, chemistries with mildly low potassium of 3.2.  Bicarb of 18 likely volume depletion.  Normal LFTs.  Pregnancy test negative.  Lipase normal. I ordered medication fluids Phenergan Pepcid Haldol and IV fluids Previous records obtained and reviewed in epic including multiple ED visits for similar presentations  After the interventions stated above, I reevaluated the patient and found the patient to be able tolerate p.o. and in her pain.  Will discharge.  Recommended abstinence from marijuana.   Final Clinical Impression(s) / ED Diagnoses Final diagnoses:  Non-intractable vomiting with nausea, unspecified vomiting type  Generalized abdominal pain    Rx / DC Orders ED Discharge Orders         Ordered    promethazine (PHENERGAN) 25 MG tablet  Every 6 hours PRN     05/20/19 2126           Hayden Rasmussen, MD 05/21/19 1029

## 2019-05-20 NOTE — ED Notes (Signed)
Pt able to tolerate fluids at this time. No sign of nausea or vomiting, pt is resting on bed comfortable.

## 2019-05-20 NOTE — ED Notes (Signed)
Gave pt gingerale and crackers for PO challenges. Will re assess to see how pt tolerates

## 2019-05-20 NOTE — ED Triage Notes (Signed)
Patient complains of 2 days of abdominal cramping with vomiting. Denies diarrhea and reports no intake for same. Patient alert and oriented, complains of weakness

## 2019-06-02 ENCOUNTER — Ambulatory Visit: Payer: Medicaid Other | Admitting: Nurse Practitioner

## 2019-06-12 ENCOUNTER — Ambulatory Visit: Payer: Medicaid Other | Admitting: Physician Assistant

## 2019-07-02 ENCOUNTER — Encounter (HOSPITAL_COMMUNITY): Payer: Self-pay | Admitting: Emergency Medicine

## 2019-07-02 ENCOUNTER — Other Ambulatory Visit: Payer: Self-pay

## 2019-07-02 DIAGNOSIS — M7918 Myalgia, other site: Secondary | ICD-10-CM | POA: Insufficient documentation

## 2019-07-02 DIAGNOSIS — F12188 Cannabis abuse with other cannabis-induced disorder: Secondary | ICD-10-CM | POA: Insufficient documentation

## 2019-07-02 DIAGNOSIS — R112 Nausea with vomiting, unspecified: Secondary | ICD-10-CM | POA: Diagnosis present

## 2019-07-02 DIAGNOSIS — R1115 Cyclical vomiting syndrome unrelated to migraine: Secondary | ICD-10-CM | POA: Insufficient documentation

## 2019-07-02 DIAGNOSIS — R1084 Generalized abdominal pain: Secondary | ICD-10-CM | POA: Diagnosis not present

## 2019-07-02 MED ORDER — SODIUM CHLORIDE 0.9% FLUSH: 3.0000 mL | Freq: Once | INTRAVENOUS | Status: DC

## 2019-07-02 MED ORDER — ONDANSETRON 4 MG PO TBDP
4.0000 mg | ORAL_TABLET | Freq: Once | ORAL | Status: AC | PRN
  Administered 2019-07-02: 4 mg via ORAL
  Filled 2019-07-02: qty 1

## 2019-07-02 NOTE — ED Notes (Signed)
Attempted lab x1 unsuccessful

## 2019-07-02 NOTE — ED Triage Notes (Signed)
Patient coming from home by [facility]. Patient is complaining of generalized abdominal pain, nausea, and vomiting x 3 days. Patient states that she has a gi dr appt on June 15. Patient states she has not had a bowel movement for 3 days due to not being able to keep anything down.

## 2019-07-03 ENCOUNTER — Emergency Department (HOSPITAL_COMMUNITY)
Admission: EM | Admit: 2019-07-03 | Discharge: 2019-07-03 | Disposition: A | Discharge status: Home / Self Care | Payer: Medicaid Other | Attending: Emergency Medicine | Admitting: Emergency Medicine

## 2019-07-03 DIAGNOSIS — R112 Nausea with vomiting, unspecified: Secondary | ICD-10-CM

## 2019-07-03 LAB — URINALYSIS, ROUTINE W REFLEX MICROSCOPIC
Glucose, UA: NEGATIVE mg/dL
Ketones, ur: 80 mg/dL — AB
Leukocytes,Ua: NEGATIVE
Nitrite: NEGATIVE
Protein, ur: 100 mg/dL — AB
Specific Gravity, Urine: 1.032 — ABNORMAL HIGH (ref 1.005–1.030)
pH: 6 (ref 5.0–8.0)

## 2019-07-03 LAB — COMPREHENSIVE METABOLIC PANEL
ALT: 22 U/L (ref 0–44)
AST: 29 U/L (ref 15–41)
Albumin: 4.1 g/dL (ref 3.5–5.0)
Alkaline Phosphatase: 56 U/L (ref 38–126)
Anion gap: 15 (ref 5–15)
BUN: 15 mg/dL (ref 6–20)
CO2: 23 mmol/L (ref 22–32)
Calcium: 9.2 mg/dL (ref 8.9–10.3)
Chloride: 100 mmol/L (ref 98–111)
Creatinine, Ser: 0.81 mg/dL (ref 0.44–1.00)
GFR calc Af Amer: >60 mL/min (ref >60)
GFR calc non Af Amer: >60 mL/min (ref >60)
Glucose, Bld: 112 mg/dL — ABNORMAL HIGH (ref 70–99)
Potassium: 3.5 mmol/L (ref 3.5–5.1)
Sodium: 138 mmol/L (ref 135–145)
Total Bilirubin: 1.1 mg/dL (ref 0.3–1.2)
Total Protein: 8.1 g/dL (ref 6.5–8.1)

## 2019-07-03 LAB — CBC
HCT: 46.6 % — ABNORMAL HIGH (ref 36.0–46.0)
Hemoglobin: 15 g/dL (ref 12.0–15.0)
MCH: 26.5 pg (ref 26.0–34.0)
MCHC: 32.2 g/dL (ref 30.0–36.0)
MCV: 82.3 fL (ref 80.0–100.0)
Platelets: 232 10*3/uL (ref 150–400)
RBC: 5.66 MIL/uL — ABNORMAL HIGH (ref 3.87–5.11)
RDW: 14.3 % (ref 11.5–15.5)
WBC: 11.9 10*3/uL — ABNORMAL HIGH (ref 4.0–10.5)
nRBC: 0 % (ref 0.0–0.2)

## 2019-07-03 LAB — RAPID URINE DRUG SCREEN, HOSP PERFORMED
Amphetamines: NOT DETECTED
Barbiturates: NOT DETECTED
Benzodiazepines: NOT DETECTED
Cocaine: NOT DETECTED
Opiates: NOT DETECTED
Tetrahydrocannabinol: POSITIVE — AB

## 2019-07-03 LAB — LIPASE, BLOOD: Lipase: 19 U/L (ref 11–51)

## 2019-07-03 LAB — I-STAT BETA HCG BLOOD, ED (MC, WL, AP ONLY): I-stat hCG, quantitative: 6 m[IU]/mL — ABNORMAL HIGH (ref <5)

## 2019-07-03 MED ORDER — PROMETHAZINE HCL 25 MG PO TABS
25.0000 mg | ORAL_TABLET | Freq: Four times a day (QID) | ORAL | 0 refills | Status: DC | PRN
Start: 2019-07-03 — End: 2019-10-14

## 2019-07-03 MED ORDER — SODIUM CHLORIDE 0.9 % IV BOLUS (SEPSIS)
1000.0000 mL | Freq: Once | INTRAVENOUS | Status: AC
  Administered 2019-07-03: 1000 mL via INTRAVENOUS

## 2019-07-03 MED ORDER — PANTOPRAZOLE SODIUM 40 MG IV SOLR
40.0000 mg | Freq: Once | INTRAVENOUS | Status: AC
  Administered 2019-07-03: 40 mg via INTRAVENOUS
  Filled 2019-07-03: qty 40

## 2019-07-03 MED ORDER — DICYCLOMINE HCL 20 MG PO TABS
20.0000 mg | ORAL_TABLET | Freq: Three times a day (TID) | ORAL | 0 refills | Status: DC | PRN
Start: 2019-07-03 — End: 2019-10-14

## 2019-07-03 MED ORDER — DICYCLOMINE HCL 10 MG/ML IM SOLN
20.0000 mg | Freq: Once | INTRAMUSCULAR | Status: AC
  Administered 2019-07-03: 20 mg via INTRAMUSCULAR
  Filled 2019-07-03: qty 2

## 2019-07-03 MED ORDER — HALOPERIDOL LACTATE 5 MG/ML IJ SOLN
5.0000 mg | Freq: Once | INTRAMUSCULAR | Status: AC
  Administered 2019-07-03: 5 mg via INTRAVENOUS
  Filled 2019-07-03: qty 1

## 2019-07-03 NOTE — Discharge Instructions (Addendum)
You may take Tylenol 1000 mg every 6 hours as needed for pain.  This medication is found over-the-counter.  I recommend that you stop smoking marijuana.  Your labs today were reassuring that your urine did suggest that you were dehydrated.  You have received 2 L of IV fluids here in the emergency department.  Please follow-up with your gastroenterologist as scheduled.

## 2019-07-03 NOTE — ED Notes (Signed)
IV access attempted multiple times via Ultrasound without success. IV team to be notified.

## 2019-07-03 NOTE — ED Provider Notes (Signed)
TIME SEEN: 2:48 AM  CHIEF COMPLAINT: Diffuse abdominal pain, nausea and vomiting  HPI: Patient is a 29 year old obese female with history of cannabinoid hyperemesis syndrome who presents to the department 3 days of nausea, vomiting and diffuse abdominal pain.  No fevers, diarrhea, dysuria, hematuria, vaginal bleeding or discharge.  No history of abdominal surgeries.  Reports she has been smoking marijuana for years.  Has outpatient gastroenterology follow-up scheduled.  ROS: See HPI Constitutional: no fever  Eyes: no drainage  ENT: no runny nose   Cardiovascular:  no chest pain  Resp: no SOB  GI: no vomiting GU: no dysuria Integumentary: no rash  Allergy: no hives  Musculoskeletal: no leg swelling  Neurological: no slurred speech ROS otherwise negative  PAST MEDICAL HISTORY/PAST SURGICAL HISTORY:  Past Medical History:  Diagnosis Date  . Abdominal pain     MEDICATIONS:  Prior to Admission medications   Medication Sig Start Date End Date Taking? Authorizing Provider  benzonatate (TESSALON) 100 MG capsule Take 1 capsule (100 mg total) by mouth every 8 (eight) hours. 04/18/19   Henderly, Britni A, PA-C  Blood Pressure KIT Check BP at home regularly large cuff z34.80 08/06/18   Shelly Bombard, MD  dicyclomine (BENTYL) 20 MG tablet Take 1 tablet (20 mg total) by mouth 2 (two) times daily. 04/04/19   Mesner, Corene Cornea, MD  fluticasone (FLONASE) 50 MCG/ACT nasal spray Place 1 spray into both nostrils daily. 04/18/19   Henderly, Britni A, PA-C  pantoprazole (PROTONIX) 20 MG tablet Take 1 tablet (20 mg total) by mouth daily. 04/04/19   Mesner, Corene Cornea, MD  potassium chloride SA (KLOR-CON) 20 MEQ tablet Take 2 tablets (40 mEq total) by mouth 2 (two) times daily. 04/04/19   Mesner, Corene Cornea, MD  promethazine (PHENERGAN) 25 MG tablet Take 1 tablet (25 mg total) by mouth every 6 (six) hours as needed for nausea or vomiting. 05/20/19   Hayden Rasmussen, MD  metoCLOPramide (REGLAN) 10 MG tablet Take 1  tablet (10 mg total) by mouth every 6 (six) hours as needed for nausea (nausea/headache). 10/12/18 04/04/19  Elnora Morrison, MD    ALLERGIES:  Allergies  Allergen Reactions  . Ibuprofen Anaphylaxis and Swelling  . Ondansetron     Other reaction(s): Angioedema (ALLERGY/intolerance)    SOCIAL HISTORY:  Social History   Tobacco Use  . Smoking status: Former Smoker    Packs/day: 0.50  . Smokeless tobacco: Never Used  Substance Use Topics  . Alcohol use: No    FAMILY HISTORY: Family History  Problem Relation Age of Onset  . Diabetes Mother   . Hypertension Mother     EXAM: BP (!) 141/93 (BP Location: Left Arm)   Pulse 71   Temp 99.4 F (37.4 C) (Oral)   Resp 18   Ht _0  (1.651 m)   Wt 86.2 kg   SpO2 100%   BMI 31.62 kg/m  CONSTITUTIONAL: Alert and oriented and responds appropriately to questions. Well-appearing; well-nourished, obese HEAD: Normocephalic EYES: Conjunctivae clear, pupils appear equal, EOM appear intact ENT: normal nose; moist mucous membranes NECK: Supple, normal ROM CARD: RRR; S1 and S2 appreciated; no murmurs, no clicks, no rubs, no gallops RESP: Normal chest excursion without splinting or tachypnea; breath sounds clear and equal bilaterally; no wheezes, no rhonchi, no rales, no hypoxia or respiratory distress, speaking full sentences ABD/GI: Normal bowel sounds; non-distended; soft, non-tender, no rebound, no guarding, no peritoneal signs, no hepatosplenomegaly BACK:  The back appears normal EXT: Normal ROM in all joints;  no deformity noted, no edema; no cyanosis SKIN: Normal color for age and race; warm; no rash on exposed skin NEURO: Moves all extremities equally PSYCH: The patient's mood and manner are appropriate.   MEDICAL DECISION MAKING: Patient here with nausea, vomiting for 3 days.  Her abdominal exam here is benign.  Suspect this is from continued marijuana use.  Have counseled and encourage patient on marijuana cessation.  Will obtain  labs, urine and give Haldol and IV fluids here.  Low suspicion for bowel obstruction, perforation, appendicitis, cholecystitis, colitis, diverticulitis, pyelonephritis, kidney stone, pancreatitis based on her benign abdominal exam.  ED PROGRESS: Labs reviewed/interpreted and are reassuring today.  Urine pending.  Will p.o. challenge.  No vomiting after Haldol.   Urine shows large ketones but no sign of infection.  Drug screen positive for THC.  No further vomiting here and able to tolerate p.o.  Will give second bag of IV fluids prior to discharge home.  Continues to complain of pain "all over".  Will give dose of IM Bentyl.  I do not feel narcotics are indicated.  Anticipate discharge home with outpatient follow-up as scheduled.  Provided with prescriptions for Phenergan, Bentyl.  Have advised her again to avoid marijuana use.   At this time, I do not feel there is any life-threatening condition present. I have reviewed, interpreted and discussed all results (EKG, imaging, lab, urine as appropriate) and exam findings with patient/family. I have reviewed nursing notes and appropriate previous records.  I feel the patient is safe to be discharged home without further emergent workup and can continue workup as an outpatient as needed. Discussed usual and customary return precautions. Patient/family verbalize understanding and are comfortable with this plan.  Outpatient follow-up has been provided as needed. All questions have been answered.    Jacqueline Shepherd was evaluated in Emergency Department on 07/03/2019 for the symptoms described in the history of present illness. She was evaluated in the context of the global COVID-19 pandemic, which necessitated consideration that the patient might be at risk for infection with the SARS-CoV-2 virus that causes COVID-19. Institutional protocols and algorithms that pertain to the evaluation of patients at risk for COVID-19 are in a state of rapid change based on  information released by regulatory bodies including the CDC and federal and state organizations. These policies and algorithms were followed during the patient's care in the ED.      Samad Thon, Delice Bison, DO 07/03/19 [phone]

## 2019-07-03 NOTE — ED Notes (Signed)
Patient threw up on the floor in the lobby.

## 2019-07-03 NOTE — ED Notes (Signed)
IV team and Charge RN attempted  multiple times with Ultrasound but unable to get it. Will call IV team to try midline.

## 2019-07-08 ENCOUNTER — Ambulatory Visit: Payer: Medicaid Other | Admitting: Physician Assistant

## 2019-08-18 ENCOUNTER — Other Ambulatory Visit: Payer: Self-pay

## 2019-08-18 ENCOUNTER — Encounter (HOSPITAL_BASED_OUTPATIENT_CLINIC_OR_DEPARTMENT_OTHER): Payer: Self-pay | Admitting: Emergency Medicine

## 2019-08-18 ENCOUNTER — Emergency Department (HOSPITAL_BASED_OUTPATIENT_CLINIC_OR_DEPARTMENT_OTHER)
Admission: EM | Admit: 2019-08-18 | Discharge: 2019-08-18 | Discharge status: Against Medical Advice | Payer: Medicaid Other | Attending: Emergency Medicine | Admitting: Emergency Medicine

## 2019-08-18 DIAGNOSIS — R1010 Upper abdominal pain, unspecified: Secondary | ICD-10-CM | POA: Diagnosis not present

## 2019-08-18 DIAGNOSIS — Z87891 Personal history of nicotine dependence: Secondary | ICD-10-CM | POA: Insufficient documentation

## 2019-08-18 DIAGNOSIS — F1290 Cannabis use, unspecified, uncomplicated: Secondary | ICD-10-CM

## 2019-08-18 DIAGNOSIS — R112 Nausea with vomiting, unspecified: Secondary | ICD-10-CM | POA: Diagnosis present

## 2019-08-18 LAB — COMPREHENSIVE METABOLIC PANEL
ALT: 17 U/L (ref 0–44)
AST: 17 U/L (ref 15–41)
Albumin: 4.2 g/dL (ref 3.5–5.0)
Alkaline Phosphatase: 54 U/L (ref 38–126)
Anion gap: 13 (ref 5–15)
BUN: 15 mg/dL (ref 6–20)
CO2: 21 mmol/L — ABNORMAL LOW (ref 22–32)
Calcium: 9.4 mg/dL (ref 8.9–10.3)
Chloride: 105 mmol/L (ref 98–111)
Creatinine, Ser: 0.8 mg/dL (ref 0.44–1.00)
GFR calc Af Amer: >60 mL/min (ref >60)
GFR calc non Af Amer: >60 mL/min (ref >60)
Glucose, Bld: 154 mg/dL — ABNORMAL HIGH (ref 70–99)
Potassium: 3.6 mmol/L (ref 3.5–5.1)
Sodium: 139 mmol/L (ref 135–145)
Total Bilirubin: 0.3 mg/dL (ref 0.3–1.2)
Total Protein: 8.4 g/dL — ABNORMAL HIGH (ref 6.5–8.1)

## 2019-08-18 LAB — CBC
HCT: 38.5 % (ref 36.0–46.0)
Hemoglobin: 12.8 g/dL (ref 12.0–15.0)
MCH: 26.8 pg (ref 26.0–34.0)
MCHC: 33.2 g/dL (ref 30.0–36.0)
MCV: 80.5 fL (ref 80.0–100.0)
Platelets: 201 10*3/uL (ref 150–400)
RBC: 4.78 MIL/uL (ref 3.87–5.11)
RDW: 13.5 % (ref 11.5–15.5)
WBC: 10 10*3/uL (ref 4.0–10.5)
nRBC: 0 % (ref 0.0–0.2)

## 2019-08-18 LAB — LIPASE, BLOOD: Lipase: 19 U/L (ref 11–51)

## 2019-08-18 MED ORDER — LORAZEPAM 2 MG/ML IJ SOLN
1.0000 mg | Freq: Once | INTRAMUSCULAR | Status: DC
  Filled 2019-08-18: qty 1

## 2019-08-18 MED ORDER — LORAZEPAM 2 MG/ML IJ SOLN
1.0000 mg | Freq: Once | INTRAMUSCULAR | Status: AC
  Administered 2019-08-18: 1 mg via INTRAMUSCULAR
  Filled 2019-08-18: qty 1

## 2019-08-18 MED ORDER — METOCLOPRAMIDE HCL 5 MG/ML IJ SOLN
10.0000 mg | Freq: Once | INTRAMUSCULAR | Status: DC
  Filled 2019-08-18: qty 2

## 2019-08-18 MED ORDER — CAPSAICIN 0.025 % EX CREA
TOPICAL_CREAM | Freq: Once | CUTANEOUS | Status: AC
  Filled 2019-08-18: qty 60

## 2019-08-18 MED ORDER — METOCLOPRAMIDE HCL 5 MG/ML IJ SOLN
10.0000 mg | Freq: Once | INTRAMUSCULAR | Status: AC
  Administered 2019-08-18: 10 mg via INTRAMUSCULAR

## 2019-08-18 MED ORDER — HALOPERIDOL LACTATE 5 MG/ML IJ SOLN
5.0000 mg | Freq: Once | INTRAMUSCULAR | Status: AC
  Administered 2019-08-18: 5 mg via INTRAMUSCULAR
  Filled 2019-08-18: qty 1

## 2019-08-18 MED ORDER — SODIUM CHLORIDE 0.9 % IV BOLUS: 1000.0000 mL | Freq: Once | INTRAVENOUS | Status: DC

## 2019-08-18 NOTE — ED Notes (Signed)
Attempt IV times 2 by this RN without success. Dr. Denton Lank aware and orders received to give medications IM.

## 2019-08-18 NOTE — ED Notes (Signed)
Report given to next shift.

## 2019-08-18 NOTE — ED Provider Notes (Signed)
[facility] EMERGENCY DEPARTMENT Provider Note   CSN: [id number] Arrival date & time: 08/18/19  0505     History Chief Complaint  Patient presents with  . Emesis    Jacqueline Shepherd is a 29 y.o. female.  Patient c/o nausea/vomiting in the past 2-3 days. Pt with hx recurrent cannabinoid hyperemesis syndrome.  Symptoms acute onset, moderate, persistent, recurrent. Emesis clear, not bloody or bilious. No abd distension. +upper abd discomfort, dull, moderate, non radiating. Is having normal bms. No fever or chills. No known ill contacts or bad food ingestion. No dysuria or gu c/o.   The history is provided by the patient.  Emesis Associated symptoms: abdominal pain   Associated symptoms: no chills, no fever, no headaches and no sore throat        Past Medical History:  Diagnosis Date  . Abdominal pain     Patient Active Problem List   Diagnosis Date Noted  . Chlamydia infection complicating pregnancy in second trimester 08/18/2018  . Rubella non-immune status, antepartum 08/18/2018  . Supervision of other normal pregnancy, antepartum 08/06/2018    Past Surgical History:  Procedure Laterality Date  . WISDOM TOOTH EXTRACTION       OB History    Gravida  1   Para      Term      Preterm      AB      Living        SAB      TAB      Ectopic      Multiple      Live Births              Family History  Problem Relation Age of Onset  . Diabetes Mother   . Hypertension Mother     Social History   Tobacco Use  . Smoking status: Former Smoker    Packs/day: 0.50  . Smokeless tobacco: Never Used  Vaping Use  . Vaping Use: Never used  Substance Use Topics  . Alcohol use: No  . Drug use: Not on file    Home Medications Prior to Admission medications   Medication Sig Start Date End Date Taking? Authorizing Provider  Blood Pressure KIT Check BP at home regularly large cuff z34.80 08/06/18   Shelly Bombard, MD  dicyclomine (BENTYL)  20 MG tablet Take 1 tablet (20 mg total) by mouth every 8 (eight) hours as needed for spasms (Abdominal cramping). 07/03/19   Ward, Delice Bison, DO  promethazine (PHENERGAN) 25 MG tablet Take 1 tablet (25 mg total) by mouth every 6 (six) hours as needed for nausea or vomiting. 07/03/19   Ward, Delice Bison, DO  fluticasone (FLONASE) 50 MCG/ACT nasal spray Place 1 spray into both nostrils daily. Patient not taking: Reported on 07/03/2019 04/18/19 07/03/19  Henderly, Britni A, PA-C  metoCLOPramide (REGLAN) 10 MG tablet Take 1 tablet (10 mg total) by mouth every 6 (six) hours as needed for nausea (nausea/headache). 10/12/18 04/04/19  Elnora Morrison, MD  pantoprazole (PROTONIX) 20 MG tablet Take 1 tablet (20 mg total) by mouth daily. Patient not taking: Reported on 07/03/2019 04/04/19 07/03/19  Mesner, Corene Cornea, MD  potassium chloride SA (KLOR-CON) 20 MEQ tablet Take 2 tablets (40 mEq total) by mouth 2 (two) times daily. Patient not taking: Reported on 07/03/2019 04/04/19 07/03/19  Mesner, Corene Cornea, MD    Allergies    Ibuprofen and Ondansetron  Review of Systems   Review of Systems  Constitutional: Negative for chills  and fever.  HENT: Negative for sore throat.   Eyes: Negative for redness.  Respiratory: Negative for shortness of breath.   Cardiovascular: Negative for chest pain.  Gastrointestinal: Positive for abdominal pain, nausea and vomiting.  Endocrine: Negative for polyuria.  Genitourinary: Negative for dysuria and flank pain.  Musculoskeletal: Negative for back pain and neck pain.  Skin: Negative for rash.  Neurological: Negative for headaches.  Hematological: Does not bruise/bleed easily.  Psychiatric/Behavioral: Negative for confusion.    Physical Exam Updated Vital Signs BP (!) 149/79   Pulse 50   Temp 99.5 F (37.5 C)   Resp 20   LMP 08/11/2019   SpO2 98%   Physical Exam Vitals and nursing note reviewed.  Constitutional:      Appearance: Normal appearance. [name] well-developed.  HENT:       Head: Atraumatic.     Nose: Nose normal.     Mouth/Throat:     Mouth: Mucous membranes are moist.     Pharynx: Oropharynx is clear.  Eyes:     General: No scleral icterus.    Conjunctiva/sclera: Conjunctivae normal.     Pupils: Pupils are equal, round, and reactive to light.  Neck:     Trachea: No tracheal deviation.  Cardiovascular:     Rate and Rhythm: Normal rate and regular rhythm.     Pulses: Normal pulses.     Heart sounds: Normal heart sounds. No murmur heard.  No friction rub. No gallop.   Pulmonary:     Effort: Pulmonary effort is normal. No respiratory distress.     Breath sounds: Normal breath sounds.  Abdominal:     General: Bowel sounds are normal. There is no distension.     Palpations: Abdomen is soft. There is no mass.     Tenderness: There is no abdominal tenderness. There is no guarding or rebound.     Hernia: No hernia is present.  Genitourinary:    Comments: No cva tenderness.  Musculoskeletal:        General: No swelling.     Cervical back: Normal range of motion and neck supple. No rigidity. No muscular tenderness.  Skin:    General: Skin is warm and dry.     Findings: No rash.  Neurological:     Mental Status: [name] alert.     Comments: Alert, speech normal.   Psychiatric:     Comments: Anxious appearing.      ED Results / Procedures / Treatments   Labs (all labs ordered are listed, but only abnormal results are displayed) Results for orders placed or performed during the hospital encounter of 08/18/19  CBC  Result Value Ref Range   WBC PENDING 4.0 - 10.5 K/uL   RBC 4.78 3.87 - 5.11 MIL/uL   Hemoglobin 12.8 12.0 - 15.0 g/dL   HCT 38.5 36 - 46 %   MCV 80.5 80.0 - 100.0 fL   MCH 26.8 26.0 - 34.0 pg   MCHC 33.2 30.0 - 36.0 g/dL   RDW 13.5 11.5 - 15.5 %   Platelets 201 150 - 400 K/uL   nRBC PENDING 0.0 - 0.2 %    EKG None  Radiology No results found.  Procedures Procedures (including critical care time)  Medications Ordered  in ED Medications  sodium chloride 0.9 % bolus 1,000 mL (has no administration in time range)  LORazepam (ATIVAN) injection 1 mg (has no administration in time range)  metoCLOPramide (REGLAN) injection 10 mg (has no administration in time  range)    ED Course  I have reviewed the triage vital signs and the nursing notes.  Pertinent labs & imaging results that were available during my care of the patient were reviewed by me and considered in my medical decision making (see chart for details).    MDM Rules/Calculators/A&P                          Iv ns bolus. Ativan 1 mg iv. reglan iv. Stat labs.   Reviewed nursing notes and prior charts for additional history. Multiple prior ED visits w similar symptoms. Pt with multiple cts abd/pelvis in past 12 months neg for acute process.   RN staff working on iv access. No emesis in ED, and pt urinating regularly. Will give initial meds IM, and pursue trial of po fluids. Labs pending.   Initial labs reviewed/interpreted by me  - hgb normal. Chem pending.   Signed out to AM provider at 0700 to check pending labs, and reassess post po trial, and dispo appropriately.      Final Clinical Impression(s) / ED Diagnoses Final diagnoses:  None    Rx / DC Orders ED Discharge Orders    None       Lajean Saver, MD 08/18/19 [phone]

## 2019-08-18 NOTE — ED Notes (Signed)
Pt left room as going by triage was asked to stop but just look at reg. And kept walking  Did not say anything to anyone about leaving

## 2019-08-18 NOTE — ED Notes (Signed)
Capsaicin cream not given    Pt refused

## 2019-08-18 NOTE — ED Provider Notes (Signed)
I received this patient in signout from Dr. Denton Lank.  History of cannabinoid hyperemesis syndrome, presented with vomiting.  The lab work was overall unremarkable.  At time of signout, patient had been given medications and awaiting reassessment for improvement.  Patient continued to have vomiting after initial round of medications, I ordered IM Haldol as well as capsaicin cream on abdomen.  Patient refused capsaicin cream.  I was informed by nursing staff later that patient had eloped without notifying anyone.   Marriana Hibberd, Ambrose Finland, MD 08/18/19 [phone]

## 2019-08-18 NOTE — ED Triage Notes (Addendum)
Emesis for three days, hx of hyperemesis r/t marijuana abuse. Yelling at this RN stating that her documented medical history is not right but will not elaborate. States she has not urinated or had bowel movement in three days, pt urinated all over floor and chairs in lobby.

## 2019-08-18 NOTE — Discharge Instructions (Addendum)
It was our pleasure to provide your ER care today - we hope that you feel better.  Please note that increasingly we are seeing marijuana as the cause of a recurrent upper abdominal pain and vomiting syndrome called 'Cannabinoid Hyperemesis Syndrome' - if you avoid using marijuana, it is likely that your symptoms will resolve.   Rest. Drink plenty of fluids.  Follow up with primary care doctor in the next 1-2 weeks - also have your blood pressure rechecked then, as it is high today.  Return to ER if worse, new symptoms, worsening or severe pain, fevers, persistent vomiting, or other concern.   You were given medication in the ER - no driving for the next 6 hours.

## 2019-08-21 ENCOUNTER — Emergency Department (HOSPITAL_COMMUNITY)
Admission: EM | Admit: 2019-08-21 | Discharge: 2019-08-21 | Disposition: A | Discharge status: Home / Self Care | Payer: Medicaid Other | Attending: Emergency Medicine | Admitting: Emergency Medicine

## 2019-08-21 ENCOUNTER — Encounter (HOSPITAL_COMMUNITY): Payer: Self-pay | Admitting: Emergency Medicine

## 2019-08-21 ENCOUNTER — Encounter (HOSPITAL_BASED_OUTPATIENT_CLINIC_OR_DEPARTMENT_OTHER): Payer: Self-pay | Admitting: Emergency Medicine

## 2019-08-21 ENCOUNTER — Emergency Department (HOSPITAL_BASED_OUTPATIENT_CLINIC_OR_DEPARTMENT_OTHER)
Admission: EM | Admit: 2019-08-21 | Discharge: 2019-08-21 | Disposition: A | Discharge status: Home / Self Care | Payer: Medicaid Other | Source: Home / Self Care | Attending: Emergency Medicine | Admitting: Emergency Medicine

## 2019-08-21 ENCOUNTER — Other Ambulatory Visit: Payer: Self-pay

## 2019-08-21 DIAGNOSIS — E876 Hypokalemia: Secondary | ICD-10-CM

## 2019-08-21 DIAGNOSIS — F1290 Cannabis use, unspecified, uncomplicated: Secondary | ICD-10-CM | POA: Insufficient documentation

## 2019-08-21 DIAGNOSIS — Z5321 Procedure and treatment not carried out due to patient leaving prior to being seen by health care provider: Secondary | ICD-10-CM | POA: Diagnosis not present

## 2019-08-21 DIAGNOSIS — R112 Nausea with vomiting, unspecified: Secondary | ICD-10-CM | POA: Insufficient documentation

## 2019-08-21 DIAGNOSIS — Z87891 Personal history of nicotine dependence: Secondary | ICD-10-CM | POA: Insufficient documentation

## 2019-08-21 HISTORY — DX: Nausea with vomiting, unspecified: R11.2

## 2019-08-21 HISTORY — DX: Cannabis use, unspecified, uncomplicated: F12.90

## 2019-08-21 HISTORY — DX: Cannabis hyperemesis syndrome: R11.16

## 2019-08-21 LAB — COMPREHENSIVE METABOLIC PANEL
ALT: 25 U/L (ref 0–44)
AST: 19 U/L (ref 15–41)
Albumin: 3.9 g/dL (ref 3.5–5.0)
Alkaline Phosphatase: 50 U/L (ref 38–126)
Anion gap: 14 (ref 5–15)
BUN: 16 mg/dL (ref 6–20)
CO2: 21 mmol/L — ABNORMAL LOW (ref 22–32)
Calcium: 9.1 mg/dL (ref 8.9–10.3)
Chloride: 102 mmol/L (ref 98–111)
Creatinine, Ser: 0.95 mg/dL (ref 0.44–1.00)
GFR calc Af Amer: >60 mL/min (ref >60)
GFR calc non Af Amer: >60 mL/min (ref >60)
Glucose, Bld: 131 mg/dL — ABNORMAL HIGH (ref 70–99)
Potassium: 2.9 mmol/L — ABNORMAL LOW (ref 3.5–5.1)
Sodium: 137 mmol/L (ref 135–145)
Total Bilirubin: 0.9 mg/dL (ref 0.3–1.2)
Total Protein: 7.5 g/dL (ref 6.5–8.1)

## 2019-08-21 LAB — LIPASE, BLOOD: Lipase: 23 U/L (ref 11–51)

## 2019-08-21 LAB — I-STAT BETA HCG BLOOD, ED (MC, WL, AP ONLY): I-stat hCG, quantitative: <5 m[IU]/mL (ref <5)

## 2019-08-21 LAB — CBC
HCT: 41.6 % (ref 36.0–46.0)
Hemoglobin: 13.8 g/dL (ref 12.0–15.0)
MCH: 26.6 pg (ref 26.0–34.0)
MCHC: 33.2 g/dL (ref 30.0–36.0)
MCV: 80.2 fL (ref 80.0–100.0)
Platelets: 218 10*3/uL (ref 150–400)
RBC: 5.19 MIL/uL — ABNORMAL HIGH (ref 3.87–5.11)
RDW: 13.5 % (ref 11.5–15.5)
WBC: 13.2 10*3/uL — ABNORMAL HIGH (ref 4.0–10.5)
nRBC: 0 % (ref 0.0–0.2)

## 2019-08-21 MED ORDER — ONDANSETRON 4 MG PO TBDP
ORAL_TABLET | ORAL | Status: AC
  Filled 2019-08-21: qty 1

## 2019-08-21 MED ORDER — HALOPERIDOL LACTATE 5 MG/ML IJ SOLN: 5.0000 mg | Freq: Once | INTRAMUSCULAR | Status: DC

## 2019-08-21 MED ORDER — HALOPERIDOL 5 MG PO TABS
5.0000 mg | ORAL_TABLET | Freq: Three times a day (TID) | ORAL | 0 refills | Status: DC | PRN
Start: 2019-08-21 — End: 2019-10-14

## 2019-08-21 MED ORDER — HALOPERIDOL LACTATE 5 MG/ML IJ SOLN
INTRAMUSCULAR | Status: AC
  Administered 2019-08-21: 5 mg
  Filled 2019-08-21: qty 1

## 2019-08-21 MED ORDER — SODIUM CHLORIDE 0.9% FLUSH: 3.0000 mL | Freq: Once | INTRAVENOUS | Status: DC

## 2019-08-21 MED ORDER — POTASSIUM CHLORIDE CRYS ER 20 MEQ PO TBCR
20.0000 meq | EXTENDED_RELEASE_TABLET | Freq: Two times a day (BID) | ORAL | 0 refills | Status: DC
Start: 2019-08-21 — End: 2019-10-14

## 2019-08-21 MED ORDER — SODIUM CHLORIDE 0.9 % IV BOLUS
1000.0000 mL | Freq: Once | INTRAVENOUS | Status: AC
  Administered 2019-08-21: 1000 mL via INTRAVENOUS

## 2019-08-21 MED ORDER — POTASSIUM CHLORIDE CRYS ER 20 MEQ PO TBCR: 40.0000 meq | EXTENDED_RELEASE_TABLET | Freq: Once | ORAL | Status: DC

## 2019-08-21 NOTE — ED Provider Notes (Signed)
[facility] Provider Note: Georgena Spurling, MD, FACEP  CSN: [id number] MRN: [id number] ARRIVAL: 08/21/19 at [place]: [place]  08/21/19 2:41 AM Jacqueline Shepherd is a 29 y.o. female with cannabis induced hyperemesis syndrome for which she was seen in the ED 08/18/2019.  She checked into [facility] ED earlier this morning but left after labs were drawn.  [name] here with vomiting and abdominal pain for 5 days.  She states she has had no bowel movements or urine output for 4 days.  She rates her pain is a 10 out of 10, worse with movement or palpation.  She describes the pain as involving her entire abdomen.   Past Medical History:  Diagnosis Date  . Abdominal pain   . Cannabinoid hyperemesis syndrome     Past Surgical History:  Procedure Laterality Date  . WISDOM TOOTH EXTRACTION      Family History  Problem Relation Age of Onset  . Diabetes Mother   . Hypertension Mother     Social History   Tobacco Use  . Smoking status: Former Smoker    Packs/day: 0.50  . Smokeless tobacco: Never Used  Vaping Use  . Vaping Use: Never used  Substance Use Topics  . Alcohol use: No  . Drug use: Not on file    Prior to Admission medications   Medication Sig Start Date End Date Taking? Authorizing Provider  Blood Pressure KIT Check BP at home regularly large cuff z34.80 08/06/18   Shelly Bombard, MD  dicyclomine (BENTYL) 20 MG tablet Take 1 tablet (20 mg total) by mouth every 8 (eight) hours as needed for spasms (Abdominal cramping). 07/03/19   Ward, Delice Bison, DO  haloperidol (HALDOL) 5 MG tablet Take 1 tablet (5 mg total) by mouth every 8 (eight) hours as needed (nausea and vomiting). 08/21/19   Hazleigh Mccleave, MD  potassium chloride SA (KLOR-CON) 20 MEQ tablet Take 1 tablet (20 mEq total) by mouth 2 (two) times daily. 08/21/19   Donovan Gatchel, Jenny Reichmann, MD  promethazine (PHENERGAN) 25 MG tablet Take 1 tablet (25 mg total)  by mouth every 6 (six) hours as needed for nausea or vomiting. 07/03/19   Ward, Delice Bison, DO  fluticasone (FLONASE) 50 MCG/ACT nasal spray Place 1 spray into both nostrils daily. Patient not taking: Reported on 07/03/2019 04/18/19 07/03/19  Henderly, Britni A, PA-C  metoCLOPramide (REGLAN) 10 MG tablet Take 1 tablet (10 mg total) by mouth every 6 (six) hours as needed for nausea (nausea/headache). 10/12/18 04/04/19  Elnora Morrison, MD  pantoprazole (PROTONIX) 20 MG tablet Take 1 tablet (20 mg total) by mouth daily. Patient not taking: Reported on 07/03/2019 04/04/19 07/03/19  Mesner, Corene Cornea, MD    Allergies Ibuprofen and Ondansetron   REVIEW OF SYSTEMS  Negative except as noted here or in the History of Present Illness.   PHYSICAL EXAMINATION  Initial Vital Signs Blood pressure (!) 153/91, pulse 52, temperature 98 F (36.7 C), temperature source Oral, resp. rate 22, height _0  (1.702 m), weight (!) 129.3 kg, last menstrual period 08/11/2019, SpO2 100 %.  Examination General: Well-developed, well-nourished female in no acute distress; appearance consistent with age of record HENT: normocephalic; atraumatic Eyes: Normal appearance Neck: supple Heart: regular rate and rhythm Lungs: clear to auscultation bilaterally Abdomen: soft; nondistended; diffusely tender; bowel sounds present Extremities: No deformity; full range of motion; pulses normal Neurologic: Awake, alert and oriented; motor function intact  in all extremities and symmetric; no facial droop Skin: Warm and dry Psychiatric: Tearful   RESULTS  Summary of this visit's results, reviewed and interpreted by myself:   EKG Interpretation  Date/Time:    Ventricular Rate:    PR Interval:    QRS Duration:   QT Interval:    QTC Calculation:   R Axis:     Text Interpretation:        Laboratory Studies: Results for orders placed or performed during the hospital encounter of 08/21/19 (from the past 24 hour(s))  Lipase, blood      Status: None   Collection Time: 08/21/19  1:40 AM  Result Value Ref Range   Lipase 23 11 - 51 U/L  Comprehensive metabolic panel     Status: Abnormal   Collection Time: 08/21/19  1:40 AM  Result Value Ref Range   Sodium 137 135 - 145 mmol/L   Potassium 2.9 (L) 3.5 - 5.1 mmol/L   Chloride 102 98 - 111 mmol/L   CO2 21 (L) 22 - 32 mmol/L   Glucose, Bld 131 (H) 70 - 99 mg/dL   BUN 16 6 - 20 mg/dL   Creatinine, Ser 0.95 0.44 - 1.00 mg/dL   Calcium 9.1 8.9 - 10.3 mg/dL   Total Protein 7.5 6.5 - 8.1 g/dL   Albumin 3.9 3.5 - 5.0 g/dL   AST 19 15 - 41 U/L   ALT 25 0 - 44 U/L   Alkaline Phosphatase 50 38 - 126 U/L   Total Bilirubin 0.9 0.3 - 1.2 mg/dL   GFR calc non Af Amer >60 >60 mL/min   GFR calc Af Amer >60 >60 mL/min   Anion gap 14 5 - 15  CBC     Status: Abnormal   Collection Time: 08/21/19  1:40 AM  Result Value Ref Range   WBC 13.2 (H) 4.0 - 10.5 K/uL   RBC 5.19 (H) 3.87 - 5.11 MIL/uL   Hemoglobin 13.8 12.0 - 15.0 g/dL   HCT 41.6 36 - 46 %   MCV 80.2 80.0 - 100.0 fL   MCH 26.6 26.0 - 34.0 pg   MCHC 33.2 30.0 - 36.0 g/dL   RDW 13.5 11.5 - 15.5 %   Platelets 218 150 - 400 K/uL   nRBC 0.0 0.0 - 0.2 %  I-Stat beta hCG blood, ED     Status: None   Collection Time: 08/21/19  2:02 AM  Result Value Ref Range   I-stat hCG, quantitative <5.0 <5 mIU/mL   Comment 3           Imaging Studies: No results found.  ED COURSE and MDM  Nursing notes, initial and subsequent vitals signs, including pulse oximetry, reviewed and interpreted by myself.  Vitals:   08/21/19 0238 08/21/19 0241  BP:  (!) 153/91  Pulse:  52  Resp:  22  Temp:  98 F (36.7 C)  TempSrc:  Oral  SpO2:  100%  Weight: (!) 129.3 kg   Height: _0  (1.702 m)    Medications  haloperidol lactate (HALDOL) injection 5 mg (5 mg Intravenous Not Given 08/21/19 0303)  potassium chloride SA (KLOR-CON) CR tablet 40 mEq (has no administration in time range)  sodium chloride 0.9 % bolus 1,000 mL (0 mLs Intravenous  Stopped 08/21/19 0409)  haloperidol lactate (HALDOL) 5 MG/ML injection (5 mg  Given 08/21/19 0302)  sodium chloride 0.9 % bolus 1,000 mL (0 mLs Intravenous Stopped 08/21/19 0541)   5:41 AM Patient has  been asleep for most of her stay in the ED even before IV Haldol was administered.  When she was awakened for an IV bag change she complained of feeling nauseated and needing something for nausea but fell asleep almost immediately.  She has not vomited while in the ED.  Her laboratory studies are reassuring except for hypokalemia (consistent with potassium loss from vomiting) which we will replete.   PROCEDURES  Procedures   ED DIAGNOSES     ICD-10-CM   1. Cannabinoid hyperemesis syndrome  R11.2    F12.90   2. Hypokalemia due to loss of potassium  E87.6        Fin[place], Jenny Reichmann, MD 08/21/19 [phone]

## 2019-08-21 NOTE — ED Triage Notes (Signed)
Pt with vomiting and abd pain x 5 days. Pt states no BM or urine x 4 days.

## 2019-08-21 NOTE — ED Triage Notes (Addendum)
Pt presents to ED BIB [facility]. Pt c.o generalized abd pain, nausea, constipation, and dysuria.Pt reports she had not had bowel movement x5d and no urine x4d.  EMS vs: bp - 157/102 Hr - 63 96%

## 2019-08-21 NOTE — ED Triage Notes (Signed)
Pt states she has not smoked marijuana in 7 days and she had to leave work early today due vomiting.

## 2019-08-24 ENCOUNTER — Emergency Department (HOSPITAL_BASED_OUTPATIENT_CLINIC_OR_DEPARTMENT_OTHER)
Admission: EM | Admit: 2019-08-24 | Discharge: 2019-08-24 | Disposition: A | Discharge status: Home / Self Care | Payer: Medicaid Other | Attending: Emergency Medicine | Admitting: Emergency Medicine

## 2019-08-24 ENCOUNTER — Other Ambulatory Visit: Payer: Self-pay

## 2019-08-24 ENCOUNTER — Encounter (HOSPITAL_BASED_OUTPATIENT_CLINIC_OR_DEPARTMENT_OTHER): Payer: Self-pay | Admitting: Emergency Medicine

## 2019-08-24 DIAGNOSIS — Z87891 Personal history of nicotine dependence: Secondary | ICD-10-CM | POA: Insufficient documentation

## 2019-08-24 DIAGNOSIS — R1084 Generalized abdominal pain: Secondary | ICD-10-CM | POA: Diagnosis not present

## 2019-08-24 DIAGNOSIS — R112 Nausea with vomiting, unspecified: Secondary | ICD-10-CM | POA: Insufficient documentation

## 2019-08-24 DIAGNOSIS — E876 Hypokalemia: Secondary | ICD-10-CM | POA: Diagnosis not present

## 2019-08-24 DIAGNOSIS — F1290 Cannabis use, unspecified, uncomplicated: Secondary | ICD-10-CM | POA: Diagnosis not present

## 2019-08-24 LAB — CBC WITH DIFFERENTIAL/PLATELET
Abs Immature Granulocytes: 0.08 10*3/uL — ABNORMAL HIGH (ref 0.00–0.07)
Basophils Absolute: 0 10*3/uL (ref 0.0–0.1)
Basophils Relative: 0 %
Eosinophils Absolute: 0.1 10*3/uL (ref 0.0–0.5)
Eosinophils Relative: 1 %
HCT: 41.2 % (ref 36.0–46.0)
Hemoglobin: 13.6 g/dL (ref 12.0–15.0)
Immature Granulocytes: 1 %
Lymphocytes Relative: 22 %
Lymphs Abs: 2.2 10*3/uL (ref 0.7–4.0)
MCH: 27 pg (ref 26.0–34.0)
MCHC: 33 g/dL (ref 30.0–36.0)
MCV: 81.7 fL (ref 80.0–100.0)
Monocytes Absolute: 0.7 10*3/uL (ref 0.1–1.0)
Monocytes Relative: 7 %
Neutro Abs: 7.1 10*3/uL (ref 1.7–7.7)
Neutrophils Relative %: 69 %
Platelets: 209 10*3/uL (ref 150–400)
RBC: 5.04 MIL/uL (ref 3.87–5.11)
RDW: 13.8 % (ref 11.5–15.5)
WBC: 10.2 10*3/uL (ref 4.0–10.5)
nRBC: 0 % (ref 0.0–0.2)

## 2019-08-24 LAB — COMPREHENSIVE METABOLIC PANEL
ALT: 21 U/L (ref 0–44)
AST: 16 U/L (ref 15–41)
Albumin: 3.9 g/dL (ref 3.5–5.0)
Alkaline Phosphatase: 49 U/L (ref 38–126)
Anion gap: 11 (ref 5–15)
BUN: 7 mg/dL (ref 6–20)
CO2: 27 mmol/L (ref 22–32)
Calcium: 8.9 mg/dL (ref 8.9–10.3)
Chloride: 101 mmol/L (ref 98–111)
Creatinine, Ser: 0.84 mg/dL (ref 0.44–1.00)
GFR calc Af Amer: >60 mL/min (ref >60)
GFR calc non Af Amer: >60 mL/min (ref >60)
Glucose, Bld: 128 mg/dL — ABNORMAL HIGH (ref 70–99)
Potassium: 2.9 mmol/L — ABNORMAL LOW (ref 3.5–5.1)
Sodium: 139 mmol/L (ref 135–145)
Total Bilirubin: 0.3 mg/dL (ref 0.3–1.2)
Total Protein: 7.7 g/dL (ref 6.5–8.1)

## 2019-08-24 MED ORDER — DROPERIDOL 2.5 MG/ML IJ SOLN
1.2500 mg | Freq: Once | INTRAMUSCULAR | Status: AC
  Administered 2019-08-24: 1.25 mg via INTRAVENOUS
  Filled 2019-08-24: qty 2

## 2019-08-24 MED ORDER — POTASSIUM CHLORIDE CRYS ER 20 MEQ PO TBCR
40.0000 meq | EXTENDED_RELEASE_TABLET | Freq: Once | ORAL | Status: AC
  Administered 2019-08-24: 40 meq via ORAL
  Filled 2019-08-24: qty 2

## 2019-08-24 MED ORDER — SODIUM CHLORIDE 0.9 % IV BOLUS
1000.0000 mL | Freq: Once | INTRAVENOUS | Status: AC
  Administered 2019-08-24: 1000 mL via INTRAVENOUS

## 2019-08-24 NOTE — Discharge Instructions (Signed)
Please read and follow all provided instructions.  Your diagnoses today include:  1. Non-intractable vomiting with nausea, unspecified vomiting type     Tests performed today include:  Blood counts and electrolytes - potassium was low  Blood tests to check kidney function  Vital signs. See below for your results today.   Medications prescribed:   None  Take any prescribed medications only as directed.  Home care instructions:   Follow any educational materials contained in this packet.  Follow-up instructions: Please follow-up with your primary care provider in the next 2 days for further evaluation of your symptoms.    Return instructions:  SEEK IMMEDIATE MEDICAL ATTENTION IF:  The pain does not go away or becomes severe   A temperature above 101F develops   Repeated vomiting occurs (multiple episodes)   The pain becomes localized to portions of the abdomen. The right side could possibly be appendicitis. In an adult, the left lower portion of the abdomen could be colitis or diverticulitis.   Blood is being passed in stools or vomit (bright red or black tarry stools)   You develop chest pain, difficulty breathing, dizziness or fainting, or become confused, poorly responsive, or inconsolable (young children)  If you have any other emergent concerns regarding your health  Additional Information: Abdominal (belly) pain can be caused by many things. Your caregiver performed an examination and possibly ordered blood/urine tests and imaging (CT scan, x-rays, ultrasound). Many cases can be observed and treated at home after initial evaluation in the emergency department. Even though you are being discharged home, abdominal pain can be unpredictable. Therefore, you need a repeated exam if your pain does not resolve, returns, or worsens. Most patients with abdominal pain don't have to be admitted to the hospital or have surgery, but serious problems like appendicitis and  gallbladder attacks can start out as nonspecific pain. Many abdominal conditions cannot be diagnosed in one visit, so follow-up evaluations are very important.  Your vital signs today were: BP (!) 163/85 (BP Location: Right Arm)   Pulse 63   Temp 99.3 F (37.4 C) (Oral)   Resp 17   Ht 5\' 7"  (1.702 m)   Wt (!) 129.3 kg   LMP 08/11/2019   SpO2 100%   BMI 44.65 kg/m  If your blood pressure (bp) was elevated above 135/85 this visit, please have this repeated by your doctor within one month. --------------

## 2019-08-24 NOTE — ED Triage Notes (Signed)
Pt c/o vomiting x 5 days.

## 2019-08-24 NOTE — ED Provider Notes (Signed)
[place] EMERGENCY DEPARTMENT Provider Note   CSN: [id number] Arrival date & time: 08/24/19  1156     History Chief Complaint  Patient presents with  . Vomiting    Jacqueline Shepherd is a 29 y.o. female.  Patient with history of hyperemesis syndrome, marijuana use presents the emergency department with 6 days of persistent nausea and vomiting.  Patient has been seen in the emergency department twice in the past week, treated, and released.  She complains of abdominal pain that is generalized.  No fevers, chest pain or shortness of breath.  Home treatments has not been helping.  Vomiting is clear, nonbloody.  No diarrhea.  No urinary symptoms.  Patient states that she has not been smoking marijuana recently.  She denies surgical history.        Past Medical History:  Diagnosis Date  . Abdominal pain   . Cannabinoid hyperemesis syndrome     Patient Active Problem List   Diagnosis Date Noted  . Chlamydia infection complicating pregnancy in second trimester 08/18/2018  . Rubella non-immune status, antepartum 08/18/2018  . Supervision of other normal pregnancy, antepartum 08/06/2018    Past Surgical History:  Procedure Laterality Date  . WISDOM TOOTH EXTRACTION       OB History    Gravida  1   Para      Term      Preterm      AB      Living        SAB      TAB      Ectopic      Multiple      Live Births              Family History  Problem Relation Age of Onset  . Diabetes Mother   . Hypertension Mother     Social History   Tobacco Use  . Smoking status: Former Smoker    Packs/day: 0.50  . Smokeless tobacco: Never Used  Vaping Use  . Vaping Use: Never used  Substance Use Topics  . Alcohol use: No  . Drug use: Not on file    Home Medications Prior to Admission medications   Medication Sig Start Date End Date Taking? Authorizing Provider  Blood Pressure KIT Check BP at home regularly large cuff z34.80 08/06/18   Shelly Bombard, MD  dicyclomine (BENTYL) 20 MG tablet Take 1 tablet (20 mg total) by mouth every 8 (eight) hours as needed for spasms (Abdominal cramping). 07/03/19   Ward, Delice Bison, DO  haloperidol (HALDOL) 5 MG tablet Take 1 tablet (5 mg total) by mouth every 8 (eight) hours as needed (nausea and vomiting). 08/21/19   Molpus, John, MD  potassium chloride SA (KLOR-CON) 20 MEQ tablet Take 1 tablet (20 mEq total) by mouth 2 (two) times daily. 08/21/19   Molpus, Jenny Reichmann, MD  promethazine (PHENERGAN) 25 MG tablet Take 1 tablet (25 mg total) by mouth every 6 (six) hours as needed for nausea or vomiting. 07/03/19   Ward, Delice Bison, DO  fluticasone (FLONASE) 50 MCG/ACT nasal spray Place 1 spray into both nostrils daily. Patient not taking: Reported on 07/03/2019 04/18/19 07/03/19  Henderly, Britni A, PA-C  metoCLOPramide (REGLAN) 10 MG tablet Take 1 tablet (10 mg total) by mouth every 6 (six) hours as needed for nausea (nausea/headache). 10/12/18 04/04/19  Elnora Morrison, MD  pantoprazole (PROTONIX) 20 MG tablet Take 1 tablet (20 mg total) by mouth daily. Patient not taking: Reported on  07/03/2019 04/04/19 07/03/19  Mesner, Barbara Cower, MD    Allergies    Ibuprofen and Ondansetron  Review of Systems   Review of Systems  Constitutional: Negative for appetite change and fever.  HENT: Negative for rhinorrhea and sore throat.   Eyes: Negative for redness.  Respiratory: Negative for cough.   Cardiovascular: Negative for chest pain.  Gastrointestinal: Positive for abdominal pain, nausea and vomiting. Negative for blood in stool and diarrhea.       Negative for hematemesis  Genitourinary: Negative for dysuria.  Musculoskeletal: Negative for myalgias.  Skin: Negative for rash.  Neurological: Negative for light-headedness.    Physical Exam Updated Vital Signs BP (!) 162/114 (BP Location: Left Arm)   Pulse 72   Temp 99.3 F (37.4 C) (Oral)   Resp 18   Ht 5\' 7"  (1.702 m)   Wt (!) 129.3 kg   LMP 08/11/2019   SpO2 99%    BMI 44.65 kg/m   Physical Exam Vitals and nursing note reviewed.  Constitutional:      General: [name] not in acute distress.    Appearance: [name] well-developed.  HENT:     Head: Normocephalic and atraumatic.     Right Ear: External ear normal.     Left Ear: External ear normal.     Nose: Nose normal.  Eyes:     Conjunctiva/sclera: Conjunctivae normal.  Cardiovascular:     Rate and Rhythm: Normal rate and regular rhythm.     Heart sounds: No murmur heard.   Pulmonary:     Effort: No respiratory distress.     Breath sounds: No wheezing, rhonchi or rales.  Abdominal:     Palpations: Abdomen is soft.     Tenderness: There is abdominal tenderness. There is no guarding or rebound.     Comments: Patient with moderate, generalized abdominal pain.  No focal areas of severe pain.  Musculoskeletal:     Cervical back: Normal range of motion and neck supple.     Right lower leg: No edema.     Left lower leg: No edema.  Skin:    General: Skin is warm and dry.     Findings: No rash.  Neurological:     General: No focal deficit present.     Mental Status: [name] alert. Mental status is at baseline.     Motor: No weakness.  Psychiatric:        Mood and Affect: Mood normal.     ED Results / Procedures / Treatments   Labs (all labs ordered are listed, but only abnormal results are displayed) Labs Reviewed  CBC WITH DIFFERENTIAL/PLATELET - Abnormal; Notable for the following components:      Result Value   Abs Immature Granulocytes 0.08 (*)    All other components within normal limits  COMPREHENSIVE METABOLIC PANEL - Abnormal; Notable for the following components:   Potassium 2.9 (*)    Glucose, Bld 128 (*)    All other components within normal limits    EKG None  Radiology No results found.  Procedures Procedures (including critical care time)  Medications Ordered in ED Medications  sodium chloride 0.9 % bolus 1,000 mL (0 mLs Intravenous Stopped 08/24/19 1532)    droperidol (INAPSINE) 2.5 MG/ML injection 1.25 mg (1.25 mg Intravenous Given 08/24/19 1429)  droperidol (INAPSINE) 2.5 MG/ML injection 1.25 mg (1.25 mg Intravenous Given 08/24/19 1558)  potassium chloride SA (KLOR-CON) CR tablet 40 mEq (40 mEq Oral Given 08/24/19 1558)    ED  Course  I have reviewed the triage vital signs and the nursing notes.  Pertinent labs & imaging results that were available during my care of the patient were reviewed by me and considered in my medical decision making (see chart for details).  Patient seen and examined.  Reviewed previous ED visits.  Presentation consistent with previous flares of nausea and vomiting.  No focality to abdominal exam.  Will attempt IV, hydration.  Patient has received Haldol in the past.  Will give a dose of droperidol and check EKG.  Vital signs reviewed and are as follows: BP (!) 162/114 (BP Location: Left Arm)   Pulse 72   Temp 99.3 F (37.4 C) (Oral)   Resp 18   Ht '5\' 7"'$  (1.702 m)   Wt (!) 129.3 kg   LMP 08/11/2019   SpO2 99%   BMI 44.65 kg/m   5:26 PM vomiting has been controlled although patient does not feel much better.  She was given an additional dose of droperidol.  She has been resting in the room.  She received IV fluids and has been tolerating oral fluids.  I asked the patient if she needs any medications for home and she states no.  She states that the only thing that helps is soaking in a warm tub.  [name] requesting discharge to home at this time along with a work note.  The patient was urged to return to the Emergency Department immediately with worsening of current symptoms, worsening abdominal pain, persistent vomiting, blood noted in stools, fever, or any other concerns. The patient verbalized understanding.      MDM Rules/Calculators/A&P                          Patient with nausea and vomiting, history of the same.  Patient was treated as above here today.  Labs show hypokalemia.  She was given oral  repletion.  She has now tolerating oral fluids.  Generalized abdominal pain on exam, without any focal findings.  [name] requesting discharged home.  Presentation today very similar to previous.  I do not feel that she requires repeat imaging at this time given normal white blood cell count, reassuring exam, and lack of new symptoms.  Previous symptoms thought due to cannabinoid hyperemesis.  She responded reasonably well to droperidol today --no further vomiting.    Final Clinical Impression(s) / ED Diagnoses Final diagnoses:  Non-intractable vomiting with nausea, unspecified vomiting type    Rx / DC Orders ED Discharge Orders    None       Carlisle Cater, PA-C 08/24/19 1728    Blanchie Dessert, MD 08/24/19 2123

## 2019-08-24 NOTE — ED Notes (Signed)
ED Provider at bedside. Josh [employer]

## 2019-10-06 ENCOUNTER — Ambulatory Visit (INDEPENDENT_AMBULATORY_CARE_PROVIDER_SITE_OTHER): Payer: Medicaid Other

## 2019-10-06 ENCOUNTER — Other Ambulatory Visit: Payer: Self-pay

## 2019-10-06 ENCOUNTER — Encounter: Payer: Self-pay | Admitting: Obstetrics

## 2019-10-06 DIAGNOSIS — Z3490 Encounter for supervision of normal pregnancy, unspecified, unspecified trimester: Secondary | ICD-10-CM

## 2019-10-06 DIAGNOSIS — N912 Amenorrhea, unspecified: Secondary | ICD-10-CM | POA: Diagnosis not present

## 2019-10-06 DIAGNOSIS — O0990 Supervision of high risk pregnancy, unspecified, unspecified trimester: Secondary | ICD-10-CM | POA: Insufficient documentation

## 2019-10-06 LAB — POCT URINE PREGNANCY: Preg Test, Ur: POSITIVE — AB

## 2019-10-06 MED ORDER — BLOOD PRESSURE MONITOR KIT: 1.0000 | PACK | 0 refills | Status: DC

## 2019-10-06 NOTE — Progress Notes (Signed)
Jacqueline Shepherd presents today for UPT. She has no unusual complaints and complains of nausea with vomiting for 2 days. LMP:09/17/19 per pt .     OBJECTIVE: Appears well, in no apparent distress.  OB History    Gravida  1   Para      Term      Preterm      AB      Living        SAB      TAB      Ectopic      Multiple      Live Births             Home UPT Result: POSITIVE x 2  In-Office UPT result: POSITIVE  I have reviewed the patient's medical, obstetrical, social, and family histories, and medications.   ASSESSMENT: Positive pregnancy test  PLAN Prenatal care to be completed at: [facility]  NOB INTAKE COMPLETED  B/P CUFF SENT

## 2019-10-06 NOTE — Progress Notes (Signed)
Patient was assessed and managed by nursing staff during this encounter. I have reviewed the chart and agree with the documentation and plan. I have also made any necessary editorial changes.  Catalina Antigua, MD 10/06/2019 10:57 AM

## 2019-10-11 ENCOUNTER — Inpatient Hospital Stay (HOSPITAL_COMMUNITY): Payer: Medicaid Other

## 2019-10-11 ENCOUNTER — Other Ambulatory Visit: Payer: Self-pay

## 2019-10-11 ENCOUNTER — Encounter (HOSPITAL_COMMUNITY): Payer: Self-pay | Admitting: Obstetrics and Gynecology

## 2019-10-11 ENCOUNTER — Inpatient Hospital Stay (HOSPITAL_COMMUNITY)
Admission: AD | Admit: 2019-10-11 | Discharge: 2019-10-14 | Disposition: A | Discharge status: Home / Self Care | Payer: Medicaid Other | Attending: Obstetrics and Gynecology | Admitting: Obstetrics and Gynecology

## 2019-10-11 DIAGNOSIS — F12288 Cannabis dependence with other cannabis-induced disorder: Secondary | ICD-10-CM

## 2019-10-11 DIAGNOSIS — O99321 Drug use complicating pregnancy, first trimester: Secondary | ICD-10-CM | POA: Diagnosis present

## 2019-10-11 DIAGNOSIS — Z2839 Other underimmunization status: Secondary | ICD-10-CM

## 2019-10-11 DIAGNOSIS — Z20822 Contact with and (suspected) exposure to covid-19: Secondary | ICD-10-CM | POA: Diagnosis not present

## 2019-10-11 DIAGNOSIS — O139 Gestational [pregnancy-induced] hypertension without significant proteinuria, unspecified trimester: Secondary | ICD-10-CM | POA: Diagnosis present

## 2019-10-11 DIAGNOSIS — Z3A01 Less than 8 weeks gestation of pregnancy: Secondary | ICD-10-CM | POA: Diagnosis not present

## 2019-10-11 DIAGNOSIS — O131 Gestational [pregnancy-induced] hypertension without significant proteinuria, first trimester: Secondary | ICD-10-CM | POA: Insufficient documentation

## 2019-10-11 DIAGNOSIS — O99211 Obesity complicating pregnancy, first trimester: Secondary | ICD-10-CM | POA: Diagnosis not present

## 2019-10-11 DIAGNOSIS — O99210 Obesity complicating pregnancy, unspecified trimester: Secondary | ICD-10-CM | POA: Diagnosis present

## 2019-10-11 DIAGNOSIS — F1290 Cannabis use, unspecified, uncomplicated: Secondary | ICD-10-CM | POA: Diagnosis present

## 2019-10-11 DIAGNOSIS — O4690 Antepartum hemorrhage, unspecified, unspecified trimester: Secondary | ICD-10-CM

## 2019-10-11 DIAGNOSIS — O09899 Supervision of other high risk pregnancies, unspecified trimester: Secondary | ICD-10-CM

## 2019-10-11 DIAGNOSIS — F12188 Cannabis abuse with other cannabis-induced disorder: Secondary | ICD-10-CM | POA: Diagnosis not present

## 2019-10-11 DIAGNOSIS — E669 Obesity, unspecified: Secondary | ICD-10-CM | POA: Insufficient documentation

## 2019-10-11 DIAGNOSIS — O99331 Smoking (tobacco) complicating pregnancy, first trimester: Secondary | ICD-10-CM | POA: Diagnosis not present

## 2019-10-11 DIAGNOSIS — R112 Nausea with vomiting, unspecified: Secondary | ICD-10-CM | POA: Diagnosis present

## 2019-10-11 DIAGNOSIS — F17210 Nicotine dependence, cigarettes, uncomplicated: Secondary | ICD-10-CM | POA: Insufficient documentation

## 2019-10-11 DIAGNOSIS — O0990 Supervision of high risk pregnancy, unspecified, unspecified trimester: Secondary | ICD-10-CM

## 2019-10-11 DIAGNOSIS — Z6841 Body Mass Index (BMI) 40.0 and over, adult: Secondary | ICD-10-CM

## 2019-10-11 DIAGNOSIS — R634 Abnormal weight loss: Secondary | ICD-10-CM | POA: Diagnosis present

## 2019-10-11 LAB — CBC WITH DIFFERENTIAL/PLATELET
Abs Immature Granulocytes: 0.08 10*3/uL — ABNORMAL HIGH (ref 0.00–0.07)
Basophils Absolute: 0 10*3/uL (ref 0.0–0.1)
Basophils Relative: 0 %
Eosinophils Absolute: 0 10*3/uL (ref 0.0–0.5)
Eosinophils Relative: 0 %
HCT: 38.8 % (ref 36.0–46.0)
Hemoglobin: 12.7 g/dL (ref 12.0–15.0)
Immature Granulocytes: 1 %
Lymphocytes Relative: 8 %
Lymphs Abs: 1.2 10*3/uL (ref 0.7–4.0)
MCH: 26.2 pg (ref 26.0–34.0)
MCHC: 32.7 g/dL (ref 30.0–36.0)
MCV: 80.2 fL (ref 80.0–100.0)
Monocytes Absolute: 0.9 10*3/uL (ref 0.1–1.0)
Monocytes Relative: 6 %
Neutro Abs: 13.3 10*3/uL — ABNORMAL HIGH (ref 1.7–7.7)
Neutrophils Relative %: 85 %
Platelets: 229 10*3/uL (ref 150–400)
RBC: 4.84 MIL/uL (ref 3.87–5.11)
RDW: 14.4 % (ref 11.5–15.5)
WBC: 15.5 10*3/uL — ABNORMAL HIGH (ref 4.0–10.5)
nRBC: 0 % (ref 0.0–0.2)

## 2019-10-11 LAB — COMPREHENSIVE METABOLIC PANEL
ALT: 13 U/L (ref 0–44)
AST: 18 U/L (ref 15–41)
Albumin: 3.7 g/dL (ref 3.5–5.0)
Alkaline Phosphatase: 45 U/L (ref 38–126)
Anion gap: 14 (ref 5–15)
BUN: 7 mg/dL (ref 6–20)
CO2: 20 mmol/L — ABNORMAL LOW (ref 22–32)
Calcium: 9.4 mg/dL (ref 8.9–10.3)
Chloride: 103 mmol/L (ref 98–111)
Creatinine, Ser: 0.8 mg/dL (ref 0.44–1.00)
GFR calc Af Amer: >60 mL/min (ref >60)
GFR calc non Af Amer: >60 mL/min (ref >60)
Glucose, Bld: 125 mg/dL — ABNORMAL HIGH (ref 70–99)
Potassium: 3.2 mmol/L — ABNORMAL LOW (ref 3.5–5.1)
Sodium: 137 mmol/L (ref 135–145)
Total Bilirubin: 0.7 mg/dL (ref 0.3–1.2)
Total Protein: 7.5 g/dL (ref 6.5–8.1)

## 2019-10-11 LAB — HCG, QUANTITATIVE, PREGNANCY: hCG, Beta Chain, Quant, S: 18522 m[IU]/mL — ABNORMAL HIGH (ref <5)

## 2019-10-11 MED ORDER — LACTATED RINGERS IV BOLUS: 1000.0000 mL | Freq: Once | INTRAVENOUS | Status: DC

## 2019-10-11 MED ORDER — FAMOTIDINE IN NACL 20-0.9 MG/50ML-% IV SOLN
20.0000 mg | Freq: Once | INTRAVENOUS | Status: AC
  Administered 2019-10-11: 20 mg via INTRAVENOUS
  Filled 2019-10-11: qty 50

## 2019-10-11 MED ORDER — LACTATED RINGERS IV BOLUS
1000.0000 mL | Freq: Once | INTRAVENOUS | Status: AC
  Administered 2019-10-11: 1000 mL via INTRAVENOUS

## 2019-10-11 MED ORDER — PROMETHAZINE HCL 25 MG/ML IJ SOLN
25.0000 mg | Freq: Once | INTRAMUSCULAR | Status: AC
  Administered 2019-10-11: 25 mg via INTRAVENOUS
  Filled 2019-10-11: qty 1

## 2019-10-11 NOTE — MAU Provider Note (Signed)
History     CSN: [id number]  Arrival date and time: 10/11/19 6073   First Provider Initiated Contact with Patient 10/11/19 2018      Chief Complaint  Patient presents with  . Emesis  . Vaginal Bleeding   HPI   Ms.Jacqueline Shepherd is a 29 y.o. female G2P0010 @ [redacted]w[redacted]d here in MAU with nausea and vomiting. Unable to keep anything down for 2 days. Reports smoking marijuana daily.  Reports vomiting over 20 times in the last 24 hours. Spending every 2 hours in the shower daily. Reports weight loss and not being able to eat anything in the last 24 hours.   OB History    Gravida  2   Para      Term      Preterm      AB  1   Living        SAB  1   TAB      Ectopic      Multiple      Live Births              Past Medical History:  Diagnosis Date  . Abdominal pain   . Cannabinoid hyperemesis syndrome     Past Surgical History:  Procedure Laterality Date  . WISDOM TOOTH EXTRACTION      Family History  Problem Relation Age of Onset  . Diabetes Mother   . Hypertension Mother     Social History   Tobacco Use  . Smoking status: Current Some Day Smoker    Packs/day: 0.50  . Smokeless tobacco: Never Used  Vaping Use  . Vaping Use: Never used  Substance Use Topics  . Alcohol use: No  . Drug use: Not on file    Comment: 1/day last used yesterday 10/10/2019    Allergies:  Allergies  Allergen Reactions  . Ibuprofen Anaphylaxis and Swelling  . Ondansetron     Other reaction(s): Angioedema (ALLERGY/intolerance)    Medications Prior to Admission  Medication Sig Dispense Refill Last Dose  . Blood Pressure KIT Check BP at home regularly large cuff z34.80 (Patient not taking: Reported on 10/06/2019) 1 kit 0   . Blood Pressure Monitor KIT 1 Device by Does not apply route once a week. To be monitored Regularly at home. 1 kit 0   . dicyclomine (BENTYL) 20 MG tablet Take 1 tablet (20 mg total) by mouth every 8 (eight) hours as needed for spasms (Abdominal  cramping). (Patient not taking: Reported on 10/06/2019) 15 tablet 0   . haloperidol (HALDOL) 5 MG tablet Take 1 tablet (5 mg total) by mouth every 8 (eight) hours as needed (nausea and vomiting). (Patient not taking: Reported on 10/06/2019) 12 tablet 0   . potassium chloride SA (KLOR-CON) 20 MEQ tablet Take 1 tablet (20 mEq total) by mouth 2 (two) times daily. (Patient not taking: Reported on 10/06/2019) 10 tablet 0   . promethazine (PHENERGAN) 25 MG tablet Take 1 tablet (25 mg total) by mouth every 6 (six) hours as needed for nausea or vomiting. (Patient not taking: Reported on 10/06/2019) 15 tablet 0    Results for orders placed or performed during the hospital encounter of 10/11/19 (from the past 48 hour(s))  CBC with Differential/Platelet     Status: Abnormal   Collection Time: 10/11/19  8:32 PM  Result Value Ref Range   WBC 15.5 (H) 4.0 - 10.5 K/uL   RBC 4.84 3.87 - 5.11 MIL/uL   Hemoglobin 12.7 12.0 - 15.0  g/dL   HCT 38.8 36 - 46 %   MCV 80.2 80.0 - 100.0 fL   MCH 26.2 26.0 - 34.0 pg   MCHC 32.7 30.0 - 36.0 g/dL   RDW 14.4 11.5 - 15.5 %   Platelets 229 150 - 400 K/uL   nRBC 0.0 0.0 - 0.2 %   Neutrophils Relative % 85 %   Neutro Abs 13.3 (H) 1.7 - 7.7 K/uL   Lymphocytes Relative 8 %   Lymphs Abs 1.2 0.7 - 4.0 K/uL   Monocytes Relative 6 %   Monocytes Absolute 0.9 0 - 1 K/uL   Eosinophils Relative 0 %   Eosinophils Absolute 0.0 0 - 0 K/uL   Basophils Relative 0 %   Basophils Absolute 0.0 0 - 0 K/uL   Immature Granulocytes 1 %   Abs Immature Granulocytes 0.08 (H) 0.00 - 0.07 K/uL    Comment: Performed at [facility] [address] [place], [place] [address]  Comprehensive metabolic panel     Status: Abnormal   Collection Time: 10/11/19  8:32 PM  Result Value Ref Range   Sodium 137 135 - 145 mmol/L   Potassium 3.2 (L) 3.5 - 5.1 mmol/L   Chloride 103 98 - 111 mmol/L   CO2 20 (L) 22 - 32 mmol/L   Glucose, Bld 125 (H) 70 - 99 mg/dL    Comment: Glucose reference range  applies only to samples taken after fasting for at least 8 hours.   BUN 7 6 - 20 mg/dL   Creatinine, Ser 0.80 0.44 - 1.00 mg/dL   Calcium 9.4 8.9 - 10.3 mg/dL   Total Protein 7.5 6.5 - 8.1 g/dL   Albumin 3.7 3.5 - 5.0 g/dL   AST 18 15 - 41 U/L   ALT 13 0 - 44 U/L   Alkaline Phosphatase 45 38 - 126 U/L   Total Bilirubin 0.7 0.3 - 1.2 mg/dL   GFR calc non Af Amer >60 >60 mL/min   GFR calc Af Amer >60 >60 mL/min   Anion gap 14 5 - 15    Comment: Performed at [facility], [place] [address], [place], [place] [address]  hCG, quantitative, pregnancy     Status: Abnormal   Collection Time: 10/11/19  8:32 PM  Result Value Ref Range   hCG, Beta Chain, Quant, S 18,522 (H) <5 mIU/mL    Comment:          GEST. AGE      CONC.  (mIU/mL)   <=1 WEEK        5 - 50     2 WEEKS       50 - 500     3 WEEKS       100 - 10,000     4 WEEKS     1,000 - 30,000     5 WEEKS     3,500 - 115,000   6-8 WEEKS     12,000 - 270,000    12 WEEKS     15,000 - 220,000        FEMALE AND NON-PREGNANT FEMALE:     LESS THAN 5 mIU/mL Performed at [facility], [place] [address] [place], [place] [address]   Urinalysis, Routine w reflex microscopic Urine, Clean Catch     Status: Abnormal   Collection Time: 10/12/19 12:30 AM  Result Value Ref Range   Color, Urine YELLOW YELLOW   APPearance HAZY (A) CLEAR   Specific  Gravity, Urine 1.029 1.005 - 1.030   pH 6.0 5.0 - 8.0   Glucose, UA NEGATIVE NEGATIVE mg/dL   Hgb urine dipstick NEGATIVE NEGATIVE   Bilirubin Urine NEGATIVE NEGATIVE   Ketones, ur 80 (A) NEGATIVE mg/dL   Protein, ur 100 (A) NEGATIVE mg/dL   Nitrite NEGATIVE NEGATIVE   Leukocytes,Ua NEGATIVE NEGATIVE   RBC / HPF 0-5 0 - 5 RBC/hpf   WBC, UA 0-5 0 - 5 WBC/hpf   Bacteria, UA RARE (A) NONE SEEN   Squamous Epithelial / LPF 11-20 0 - 5   Mucus PRESENT     Comment: Performed at [facility], [place] [address] [place],  [address]   US OB LESS THAN 14 WEEKS WITH OB TRANSVAGINAL  Result  Date: 10/11/2019 CLINICAL DATA:  Vaginal bleeding, pelvic pain, quantitative beta HCG 18,522 EXAM: OBSTETRIC <14 WK [place] AND TRANSVAGINAL OB US TECHNIQUE: Both transabdominal and transvaginal ultrasound examinations were performed for complete evaluation of the gestation as well as the maternal uterus, adnexal regions, and pelvic cul-de-sac. Transvaginal technique was performed to assess early pregnancy. COMPARISON:  None. FINDINGS: Intrauterine gestational sac: Single Yolk sac:  Visualized. Embryo:  Visualized. Cardiac Activity: Visualized. Heart Rate: 118 bpm CRL: 5.6 mm   6 w   2 d Subchorionic hemorrhage: There is a large subchorionic hemorrhage along the anterior and inferior margin of the gestational sac. Maternal uterus/adnexae: The uterus is anteverted. Right ovary measures 2.4 x 1.3 x 1.7 cm and the left ovary measures 2.7 x 2.1 x 2.0 cm. There is trace pelvic free fluid. IMPRESSION: 1. Single live intrauterine pregnancy as above, estimated age 73 weeks and 2 days. 2. Large subchorionic hemorrhage as above. 3. Trace pelvic free fluid. Electronically Signed   By: Randa Ngo M.D.   On: 10/11/2019 21:53   Review of Systems  Constitutional: Positive for activity change, appetite change and chills.  Gastrointestinal: Positive for abdominal pain, nausea and vomiting.   Physical Exam   Blood pressure 137/62, pulse (!) 52, temperature 99 F (37.2 C), temperature source Oral, resp. rate 18, last menstrual period 09/17/2019.  Physical Exam Constitutional:      Appearance: Normal appearance. [name] obese. [name] ill-appearing and diaphoretic.  Cardiovascular:     Rate and Rhythm: Normal rate.  Pulmonary:     Effort: Pulmonary effort is normal.     Breath sounds: Normal breath sounds.  Abdominal:     Palpations: Abdomen is soft.     Tenderness: There is no abdominal tenderness.  Musculoskeletal:     Cervical back: Normal range of motion.  Neurological:     Mental Status: [name] alert.     MAU Course  Procedures  None  MDM  Urine shows >80 ketones. Hypokalemia on labs. Discussed with Dr. Elonda Husky.  Will admit.  8/1: weight 129 Kg, down to 120 kg today.  Lr bolus with pepcid and phenergan. Patient vomiting in MAU>  Assessment and Plan   A:  1. Cannabis hyperemesis syndrome concurrent with and due to cannabis dependence ([place])   2. [redacted] weeks gestation of pregnancy   3. Vaginal bleeding during pregnancy   4. [redacted] weeks gestation of pregnancy     P  Admit for IV fluids and potassium replacement Haldol if needed. Dr. Elonda Husky to resume care  Reda Citron, Artist Pais, NP 10/15/2019 6:46 AM

## 2019-10-11 NOTE — MAU Note (Signed)
EMS arrival. Pt stated she had a positive Pregnancy test at the office on Monday. Stared having abd  N/v  2 days ago. Started having abd and vag bleeding today. reports changing pad every 1-2 hour. No active bleeding noted a this time

## 2019-10-12 ENCOUNTER — Encounter (HOSPITAL_COMMUNITY): Payer: Self-pay | Admitting: Obstetrics and Gynecology

## 2019-10-12 DIAGNOSIS — O99321 Drug use complicating pregnancy, first trimester: Secondary | ICD-10-CM | POA: Diagnosis not present

## 2019-10-12 DIAGNOSIS — O139 Gestational [pregnancy-induced] hypertension without significant proteinuria, unspecified trimester: Secondary | ICD-10-CM | POA: Diagnosis present

## 2019-10-12 DIAGNOSIS — R634 Abnormal weight loss: Secondary | ICD-10-CM

## 2019-10-12 DIAGNOSIS — Z6841 Body Mass Index (BMI) 40.0 and over, adult: Secondary | ICD-10-CM

## 2019-10-12 DIAGNOSIS — Z3A01 Less than 8 weeks gestation of pregnancy: Secondary | ICD-10-CM | POA: Diagnosis not present

## 2019-10-12 DIAGNOSIS — O99210 Obesity complicating pregnancy, unspecified trimester: Secondary | ICD-10-CM | POA: Diagnosis present

## 2019-10-12 DIAGNOSIS — F12188 Cannabis abuse with other cannabis-induced disorder: Secondary | ICD-10-CM | POA: Diagnosis not present

## 2019-10-12 DIAGNOSIS — R112 Nausea with vomiting, unspecified: Secondary | ICD-10-CM | POA: Diagnosis present

## 2019-10-12 HISTORY — DX: Gestational (pregnancy-induced) hypertension without significant proteinuria, unspecified trimester: O13.9

## 2019-10-12 HISTORY — DX: Abnormal weight loss: R63.4

## 2019-10-12 LAB — URINALYSIS, ROUTINE W REFLEX MICROSCOPIC
Bilirubin Urine: NEGATIVE
Glucose, UA: NEGATIVE mg/dL
Hgb urine dipstick: NEGATIVE
Ketones, ur: 80 mg/dL — AB
Leukocytes,Ua: NEGATIVE
Nitrite: NEGATIVE
Protein, ur: 100 mg/dL — AB
Specific Gravity, Urine: 1.029 (ref 1.005–1.030)
pH: 6 (ref 5.0–8.0)

## 2019-10-12 LAB — RAPID URINE DRUG SCREEN, HOSP PERFORMED
Amphetamines: NOT DETECTED
Barbiturates: NOT DETECTED
Benzodiazepines: NOT DETECTED
Cocaine: NOT DETECTED
Opiates: NOT DETECTED
Tetrahydrocannabinol: POSITIVE — AB

## 2019-10-12 LAB — SARS CORONAVIRUS 2 BY RT PCR (HOSPITAL ORDER, PERFORMED IN ~~LOC~~ HOSPITAL LAB): SARS Coronavirus 2: NEGATIVE

## 2019-10-12 MED ORDER — DOCUSATE SODIUM 100 MG PO CAPS: 100.0000 mg | ORAL_CAPSULE | Freq: Every day | ORAL | Status: DC

## 2019-10-12 MED ORDER — HALOPERIDOL LACTATE 5 MG/ML IJ SOLN
2.0000 mg | Freq: Four times a day (QID) | INTRAMUSCULAR | Status: DC | PRN
  Administered 2019-10-12 – 2019-10-13 (×2): 2 mg via INTRAVENOUS
  Filled 2019-10-12 (×3): qty 0.4

## 2019-10-12 MED ORDER — THIAMINE HCL 100 MG/ML IJ SOLN
Freq: Once | INTRAVENOUS | Status: AC
  Filled 2019-10-12: qty 1000

## 2019-10-12 MED ORDER — DEXTROSE IN LACTATED RINGERS 5 % IV SOLN: INTRAVENOUS | Status: DC

## 2019-10-12 MED ORDER — CALCIUM CARBONATE ANTACID 500 MG PO CHEW: 2.0000 | CHEWABLE_TABLET | ORAL | Status: DC | PRN

## 2019-10-12 MED ORDER — ZOLPIDEM TARTRATE 5 MG PO TABS: 5.0000 mg | ORAL_TABLET | Freq: Every evening | ORAL | Status: DC | PRN

## 2019-10-12 MED ORDER — SCOPOLAMINE 1 MG/3DAYS TD PT72
1.0000 | MEDICATED_PATCH | TRANSDERMAL | Status: DC
  Administered 2019-10-12: 1.5 mg via TRANSDERMAL
  Filled 2019-10-12: qty 1

## 2019-10-12 MED ORDER — VITAMIN B-6 25 MG PO TABS
25.0000 mg | ORAL_TABLET | Freq: Three times a day (TID) | ORAL | Status: DC
  Administered 2019-10-12 – 2019-10-14 (×6): 25 mg via ORAL
  Filled 2019-10-12 (×6): qty 1

## 2019-10-12 MED ORDER — POTASSIUM CHLORIDE 2 MEQ/ML IV SOLN
INTRAVENOUS | Status: DC
  Filled 2019-10-12 (×3): qty 1000

## 2019-10-12 MED ORDER — PROCHLORPERAZINE EDISYLATE 10 MG/2ML IJ SOLN
10.0000 mg | Freq: Four times a day (QID) | INTRAMUSCULAR | Status: DC
  Administered 2019-10-12 – 2019-10-14 (×8): 10 mg via INTRAVENOUS
  Filled 2019-10-12 (×11): qty 2

## 2019-10-12 MED ORDER — FAMOTIDINE IN NACL 20-0.9 MG/50ML-% IV SOLN
20.0000 mg | Freq: Two times a day (BID) | INTRAVENOUS | Status: DC
  Administered 2019-10-12 – 2019-10-13 (×4): 20 mg via INTRAVENOUS
  Filled 2019-10-12 (×4): qty 50

## 2019-10-12 MED ORDER — PROMETHAZINE HCL 25 MG/ML IJ SOLN
25.0000 mg | Freq: Four times a day (QID) | INTRAMUSCULAR | Status: DC | PRN
  Administered 2019-10-12: 25 mg via INTRAVENOUS
  Filled 2019-10-12: qty 1

## 2019-10-12 MED ORDER — PRENATAL MULTIVITAMIN CH
1.0000 | ORAL_TABLET | Freq: Every day | ORAL | Status: DC
  Administered 2019-10-13: 1 via ORAL
  Filled 2019-10-12: qty 1

## 2019-10-12 MED ORDER — ACETAMINOPHEN 325 MG PO TABS
650.0000 mg | ORAL_TABLET | ORAL | Status: DC | PRN
  Administered 2019-10-12 – 2019-10-14 (×3): 650 mg via ORAL
  Filled 2019-10-12 (×3): qty 2

## 2019-10-12 MED ORDER — THIAMINE HCL 100 MG/ML IJ SOLN: INTRAVENOUS | Status: DC

## 2019-10-12 NOTE — MAU Note (Signed)
Pt to U/S. Pt c/o abd pain and nausea when getting up to w/c very restless when not laying down in bed. U/S tech  Informed of pt behavior. Had nurse tech stay with pt during exam. Pt assisted back to bed and vomited several times before she settled back down.

## 2019-10-12 NOTE — Progress Notes (Signed)
Responded to IV consult. Pt taking shower. Reguested for RN to enter new consult when pt out of shower.

## 2019-10-12 NOTE — MAU Note (Signed)
Pt drank ginger ale and is keeping it down for now but still sow not have urge to urinate.

## 2019-10-12 NOTE — MAU Note (Signed)
Pt up to BR to attempt to urinate . Unable to . When pt gets up to move starts to c/o abd pain and feeling nauseated. Feels hot then cold and very restless. Assisted back to bed and placed many covers on her per her request.

## 2019-10-12 NOTE — Progress Notes (Signed)
Daily Antepartum Note  Admission Date: 10/11/2019 Current Date: 10/12/2019 10:11 AM  Jacqueline Shepherd is a 29 y.o. G2P0010 @ [redacted]w[redacted]d by 6wk u/s, HD#2, admitted for Cannabinoid hyperemesis syndrome .  Pregnancy complicated by:  Patient Active Problem List   Diagnosis Date Noted   Obesity in pregnancy 10/12/2019   BMI 40.0-44.9, adult (HCC) 10/12/2019   Weight loss 10/12/2019   Transient hypertension of pregnancy 10/12/2019   Cannabinoid hyperemesis syndrome    Supervision of high risk pregnancy, antepartum 10/06/2019   Rubella non-immune status, antepartum 08/18/2018    Overnight/24hr events:  Patient admitted overnight  Subjective:  Patient still with n/v.  Objective:    Current Vital Signs 24h Vital Sign Ranges  T 98.8 F (37.1 C) Temp  Avg: 98.9 F (37.2 C)  Min: 98.8 F (37.1 C)  Max: 99 F (37.2 C)  BP (!) 150/82 BP  Min: 118/54  Max: 150/82  HR (!) 54 Pulse  Avg: 55.7  Min: 52  Max: 61  RR 16 Resp  Avg: 17.3  Min: 16  Max: 18  SaO2 98 % Room Air SpO2  Avg: 98 %  Min: 98 %  Max: 98 %       24 Hour I/O Current Shift I/O  Time Ins Outs 09/18 0701 - 09/19 0700 In: 1252.9 [P.O.:480; I.V.:272.9] Out: 850  No intake/output data recorded.   Patient Vitals for the past 24 hrs:  BP Temp Temp src Pulse Resp SpO2 Height Weight  10/12/19 0826 (!) 150/82 98.8 F (37.1 C) Oral (!) 54 16 98 % -- --  10/12/19 0320 (!) 118/54 -- -- 61 18 98 % -- --  10/12/19 0100 -- -- -- -- -- -- 5\' 7"  (1.702 m) --  10/12/19 0047 -- -- -- -- -- -- -- 120.2 kg  10/11/19 1951 137/62 99 F (37.2 C) Oral (!) 52 18 -- -- --    Physical exam: General: patient sitting in hot shower. She states this helps with the n/v  Medications: Current Facility-Administered Medications  Medication Dose Route Frequency Provider Last Rate Last Admin   acetaminophen (TYLENOL) tablet 650 mg  650 mg Oral Q4H PRN Rasch, 10/13/19 I, NP       calcium carbonate (TUMS - dosed in mg elemental calcium)  chewable tablet 400 mg of elemental calcium  2 tablet Oral Q4H PRN Rasch, Victorino Dike I, NP       famotidine (PEPCID) IVPB 20 mg premix  20 mg Intravenous Q12H Victorino Dike, MD       haloperidol lactate (HALDOL) injection 2 mg  2 mg Intravenous Q6H PRN Rasch, [place] Bing I, NP       lactated ringers bolus 1,000 mL  1,000 mL Intravenous Once Rasch, Victorino Dike I, NP       prenatal multivitamin tablet 1 tablet  1 tablet Oral Q1200 Rasch, Victorino Dike I, NP       prochlorperazine (COMPAZINE) injection 10 mg  10 mg Intravenous Q6H Victorino Dike, MD       scopolamine (TRANSDERM-SCOP) 1 MG/3DAYS 1.5 mg  1 patch Transdermal Q72H [place] Bing, MD       sodium chloride 0.9 % 1,000 mL with thiamine 100 mg, folic acid 1 mg, multivitamins adult 10 mL infusion   Intravenous Once [place] Bing, MD       vitamin B-6 (pyridOXINE) tablet 25 mg  25 mg Oral Q8H [place] Bing, MD        Labs:  Recent Labs  Lab 10/11/19 2032  WBC 15.5*  HGB 12.7  HCT 38.8  PLT 229    Recent Labs  Lab 10/11/19 2032  NA 137  K 3.2*  CL 103  CO2 20*  BUN 7  CREATININE 0.80  CALCIUM 9.4  PROT 7.5  BILITOT 0.7  ALKPHOS 45  ALT 13  AST 18  GLUCOSE 125*   UDS: +THC  Radiology:  No new imaging  Assessment & Plan:  Pt stable *Pregnancy: routine care *FEN/GI: continue current regimen. D/w her once she's better that she needs to stop using THC. Recheck cmp tomorros *CV: follow and start medications PRN if still hypertensive once s/s are better. Normal early pregnancy u/s *PPx: SCDs *Dispo: once s/s are better  Cornelia Copa MD Attending Center for [facility] GYN Consult Phone: [phone] (M-F, [phone]) & [phone] (Off hours, weekends, holidays)

## 2019-10-12 NOTE — MAU Note (Signed)
Pt up to BR urinated but vomited moderate amount of bile colored emesis. Provider informed.

## 2019-10-12 NOTE — MAU Note (Signed)
IV Team nurse at bedside to start IV. Pt tolerated well.

## 2019-10-13 DIAGNOSIS — Z3A01 Less than 8 weeks gestation of pregnancy: Secondary | ICD-10-CM | POA: Diagnosis not present

## 2019-10-13 DIAGNOSIS — F12188 Cannabis abuse with other cannabis-induced disorder: Secondary | ICD-10-CM | POA: Diagnosis not present

## 2019-10-13 DIAGNOSIS — O99321 Drug use complicating pregnancy, first trimester: Secondary | ICD-10-CM | POA: Diagnosis not present

## 2019-10-13 LAB — COMPREHENSIVE METABOLIC PANEL
ALT: 13 U/L (ref 0–44)
AST: 16 U/L (ref 15–41)
Albumin: 2.8 g/dL — ABNORMAL LOW (ref 3.5–5.0)
Alkaline Phosphatase: 37 U/L — ABNORMAL LOW (ref 38–126)
Anion gap: 9 (ref 5–15)
BUN: 8 mg/dL (ref 6–20)
CO2: 23 mmol/L (ref 22–32)
Calcium: 8.7 mg/dL — ABNORMAL LOW (ref 8.9–10.3)
Chloride: 104 mmol/L (ref 98–111)
Creatinine, Ser: 0.83 mg/dL (ref 0.44–1.00)
GFR calc Af Amer: >60 mL/min (ref >60)
GFR calc non Af Amer: >60 mL/min (ref >60)
Glucose, Bld: 112 mg/dL — ABNORMAL HIGH (ref 70–99)
Potassium: 2.8 mmol/L — ABNORMAL LOW (ref 3.5–5.1)
Sodium: 136 mmol/L (ref 135–145)
Total Bilirubin: 0.6 mg/dL (ref 0.3–1.2)
Total Protein: 5.7 g/dL — ABNORMAL LOW (ref 6.5–8.1)

## 2019-10-13 MED ORDER — POTASSIUM CHLORIDE CRYS ER 20 MEQ PO TBCR
20.0000 meq | EXTENDED_RELEASE_TABLET | Freq: Three times a day (TID) | ORAL | Status: DC
  Administered 2019-10-13 (×2): 20 meq via ORAL
  Filled 2019-10-13 (×2): qty 1

## 2019-10-13 NOTE — Progress Notes (Signed)
Patient ID: Jacqueline Shepherd, female   DOB: November 02, 1990, 29 y.o.   MRN: [id number] FACULTY PRACTICE ANTEPARTUM(COMPREHENSIVE) NOTE  Jacqueline Shepherd is a 29 y.o. G2P0010 with Estimated Date of Delivery: 06/03/20   By  ultrasound [redacted]w[redacted]d  who is admitted for Jacqueline Shepherd.    Fetal presentation is unsure. Length of Stay:  0  Days  Date of admission:10/11/2019  Subjective: Not as much emesis yesterday Patient reports the fetal movement as . Patient reports uterine contraction  activity as . Patient reports  vaginal bleeding as none. Patient describes fluid per vagina as .  Vitals:  Blood pressure (!) 103/42, pulse (!) 59, temperature 98.8 F (37.1 C), temperature source Oral, resp. rate 18, height 5\' 7"  (1.702 m), weight 120.2 kg, last menstrual period 09/17/2019, SpO2 96 %. Vitals:   10/12/19 2311 10/12/19 2336 10/13/19 0524 10/13/19 0805  BP: (!) 144/94 (!) 150/66 111/63 (!) 103/42  Pulse: 81 (!) 52 74 (!) 59  Resp: 20  18 18   Temp: 98.7 F (37.1 C)  98.8 F (37.1 C) 98.8 F (37.1 C)  TempSrc: Oral  Oral Oral  SpO2: 100%  97% 96%  Weight:      Height:       Physical Examination:  General appearance - alert, well appearing, and in no distress Abdomen - soft, nontender, nondistended, no masses or organomegaly   Fetal Monitoring: n/a  Labs:  Results for orders placed or performed during the hospital encounter of 10/11/19 (from the past 24 hour(s))  Comprehensive metabolic panel   Collection Time: 10/13/19  6:34 AM  Result Value Ref Range   Sodium 136 135 - 145 mmol/L   Potassium 2.8 (L) 3.5 - 5.1 mmol/L   Chloride 104 98 - 111 mmol/L   CO2 23 22 - 32 mmol/L   Glucose, Bld 112 (H) 70 - 99 mg/dL   BUN 8 6 - 20 mg/dL   Creatinine, Ser 10/13/19 0.44 - 1.00 mg/dL   Calcium 8.7 (L) 8.9 - 10.3 mg/dL   Total Protein 5.7 (L) 6.5 - 8.1 g/dL   Albumin 2.8 (L) 3.5 - 5.0 g/dL   AST 16 15 - 41 U/L   ALT 13 0 - 44 U/L   Alkaline Phosphatase 37 (L) 38 - 126 U/L   Total Bilirubin 0.6 0.3 - 1.2 mg/dL    GFR calc non Af Amer >60 >60 mL/min   GFR calc Af Amer >60 >60 mL/min   Anion gap 9 5 - 15    Imaging Studies:    10/15/19 OB LESS THAN 14 WEEKS WITH OB TRANSVAGINAL  Result Date: 10/11/2019 CLINICAL DATA:  Vaginal bleeding, pelvic pain, quantitative beta HCG 18,522 EXAM: OBSTETRIC <14 WK [place] AND TRANSVAGINAL OB 10/13/2019 TECHNIQUE: Both transabdominal and transvaginal ultrasound examinations were performed for complete evaluation of the gestation as well as the maternal uterus, adnexal regions, and pelvic cul-de-sac. Transvaginal technique was performed to assess early pregnancy. COMPARISON:  None. FINDINGS: Intrauterine gestational sac: Single Yolk sac:  Visualized. Embryo:  Visualized. Cardiac Activity: Visualized. Heart Rate: 118 bpm CRL: 5.6 mm   6 w   2 d Subchorionic hemorrhage: There is a large subchorionic hemorrhage along the anterior and inferior margin of the gestational sac. Maternal uterus/adnexae: The uterus is anteverted. Right ovary measures 2.4 x 1.3 x 1.7 cm and the left ovary measures 2.7 x 2.1 x 2.0 cm. There is trace pelvic free fluid. IMPRESSION: 1. Single live intrauterine pregnancy as above, estimated age 47 weeks and 2 days.  2. Large subchorionic hemorrhage as above. 3. Trace pelvic free fluid. Electronically Signed   By: Sharlet Salina M.D.   On: 10/11/2019 21:53     Medications:  Scheduled . prenatal multivitamin  1 tablet Oral Q1200  . prochlorperazine  10 mg Intravenous Q6H  . scopolamine  1 patch Transdermal Q72H  . vitamin B-6  25 mg Oral Q8H   I have reviewed the patient's current medications.  ASSESSMENT: G2P0010 [redacted]w[redacted]d Estimated Date of Delivery: 06/03/20  Patient Active Problem List   Diagnosis Date Noted  . Obesity in pregnancy 10/12/2019  . BMI 40.0-44.9, adult (HCC) 10/12/2019  . Weight loss 10/12/2019  . Transient hypertension of pregnancy 10/12/2019  . Cannabinoid hyperemesis syndrome   . Supervision of high risk pregnancy, antepartum 10/06/2019  . Rubella  non-immune status, antepartum 08/18/2018    PLAN: Advance diet Avoid Caanibis as outpatient Anticipate discharge tomorrow  Jacqueline Shepherd 10/13/2019,8:50 AM

## 2019-10-14 DIAGNOSIS — Z3A01 Less than 8 weeks gestation of pregnancy: Secondary | ICD-10-CM | POA: Diagnosis not present

## 2019-10-14 DIAGNOSIS — O99321 Drug use complicating pregnancy, first trimester: Secondary | ICD-10-CM | POA: Diagnosis not present

## 2019-10-14 DIAGNOSIS — F12188 Cannabis abuse with other cannabis-induced disorder: Secondary | ICD-10-CM | POA: Diagnosis not present

## 2019-10-14 LAB — BASIC METABOLIC PANEL
Anion gap: 9 (ref 5–15)
BUN: 6 mg/dL (ref 6–20)
CO2: 24 mmol/L (ref 22–32)
Calcium: 8.4 mg/dL — ABNORMAL LOW (ref 8.9–10.3)
Chloride: 105 mmol/L (ref 98–111)
Creatinine, Ser: 0.85 mg/dL (ref 0.44–1.00)
GFR calc Af Amer: >60 mL/min (ref >60)
GFR calc non Af Amer: >60 mL/min (ref >60)
Glucose, Bld: 101 mg/dL — ABNORMAL HIGH (ref 70–99)
Potassium: 3.4 mmol/L — ABNORMAL LOW (ref 3.5–5.1)
Sodium: 138 mmol/L (ref 135–145)

## 2019-10-14 MED ORDER — ZOLPIDEM TARTRATE 5 MG PO TABS
5.0000 mg | ORAL_TABLET | Freq: Every evening | ORAL | Status: DC | PRN
  Administered 2019-10-14: 5 mg via ORAL
  Filled 2019-10-14: qty 1

## 2019-10-14 MED ORDER — FAMOTIDINE 20 MG PO TABS: 20.0000 mg | ORAL_TABLET | Freq: Every day | ORAL | 3 refills | Status: DC

## 2019-10-14 MED ORDER — PYRIDOXINE HCL 25 MG PO TABS
25.0000 mg | ORAL_TABLET | Freq: Three times a day (TID) | ORAL | 2 refills | Status: DC
Start: 2019-10-14 — End: 2019-10-29

## 2019-10-14 MED ORDER — PROMETHAZINE HCL 25 MG PO TABS: 25.0000 mg | ORAL_TABLET | Freq: Four times a day (QID) | ORAL | 2 refills | Status: DC | PRN

## 2019-10-14 NOTE — Discharge Summary (Addendum)
Antenatal Physician Discharge Summary  Patient ID: Jacqueline Shepherd MRN: [id number] DOB/AGE: 1990/05/23 29 y.o.  Admit date: 10/11/2019 Discharge date: 10/14/2019  Admission Diagnoses: cannabinoid hyperemesis syndrom  Discharge Diagnoses:  Principal Problem:   Cannabinoid hyperemesis syndrome Active Problems:   Rubella non-immune status, antepartum   Supervision of high risk pregnancy, antepartum   Obesity in pregnancy   BMI 40.0-44.9, adult ([place])   Weight loss   Transient hypertension of pregnancy   Prenatal Procedures: none  Consults: none  Hospital Course:  This is a 29 y.o. G2P0010 with IUP at 69w5dadmitted for nausea/vomiting due to cannabinoid hyperemesis syndrome. Patient improved with fluids, anti-emetics. By HD#3, she was much improved and requesting to go home, stating she was eating well without nausea/vomiting, using the restroom without issue. Advised patient to stop smoking marijuana and importance of not smoking in pregnancy, also that nausea will return if she continues smoking. She verbalizes understanding and states she will not smoke any more.   She has NOB scheduled for 12/01/19, will have check in appt to ensure her nausea is under control in 2-3 weeks. She was deemed stable for discharge to home with outpatient follow up.  Discharge Exam: Temp:  [98.7 F (37.1 C)-98.9 F (37.2 C)] 98.8 F (37.1 C) (09/21 0533) Pulse Rate:  [64-136] 64 (09/21 0533) Resp:  [18] 18 (09/21 0533) BP: (87-105)/(39-66) 103/54 (09/21 0533) SpO2:  [100 %] 100 % (09/21 0533) Physical Examination: CONSTITUTIONAL: Well-developed, well-nourished female in no acute distress.  HENT:  Normocephalic, atraumatic, External right and left ear normal. Oropharynx is clear and moist EYES: Conjunctivae and EOM are normal. Pupils are equal, round, and reactive to light. No scleral icterus.  NECK: Normal range of motion, supple, no masses SKIN: Skin is warm and dry. No rash noted. Not  diaphoretic. No erythema. No pallor. NTaylor Alert and oriented to person, place, and time. Normal reflexes, muscle tone coordination. No cranial nerve deficit noted. PSYCHIATRIC: Normal mood and affect. Normal behavior. Normal judgment and thought content. CARDIOVASCULAR: Normal heart rate noted RESPIRATORY: Effort normal, no problems with respiration noted MUSCULOSKELETAL: Normal range of motion. No edema and no tenderness. 2+ distal pulses. ABDOMEN: Soft, nontender, nondistended CERVIX:   deferred  Significant Diagnostic Studies:  Results for orders placed or performed during the hospital encounter of 10/11/19 (from the past 168 hour(s))  CBC with Differential/Platelet   Collection Time: 10/11/19  8:32 PM  Result Value Ref Range   WBC 15.5 (H) 4.0 - 10.5 K/uL   RBC 4.84 3.87 - 5.11 MIL/uL   Hemoglobin 12.7 12.0 - 15.0 g/dL   HCT 38.8 36 - 46 %   MCV 80.2 80.0 - 100.0 fL   MCH 26.2 26.0 - 34.0 pg   MCHC 32.7 30.0 - 36.0 g/dL   RDW 14.4 11.5 - 15.5 %   Platelets 229 150 - 400 K/uL   nRBC 0.0 0.0 - 0.2 %   Neutrophils Relative % 85 %   Neutro Abs 13.3 (H) 1.7 - 7.7 K/uL   Lymphocytes Relative 8 %   Lymphs Abs 1.2 0.7 - 4.0 K/uL   Monocytes Relative 6 %   Monocytes Absolute 0.9 0 - 1 K/uL   Eosinophils Relative 0 %   Eosinophils Absolute 0.0 0 - 0 K/uL   Basophils Relative 0 %   Basophils Absolute 0.0 0 - 0 K/uL   Immature Granulocytes 1 %   Abs Immature Granulocytes 0.08 (H) 0.00 - 0.07 K/uL  Comprehensive metabolic panel  Collection Time: 10/11/19  8:32 PM  Result Value Ref Range   Sodium 137 135 - 145 mmol/L   Potassium 3.2 (L) 3.5 - 5.1 mmol/L   Chloride 103 98 - 111 mmol/L   CO2 20 (L) 22 - 32 mmol/L   Glucose, Bld 125 (H) 70 - 99 mg/dL   BUN 7 6 - 20 mg/dL   Creatinine, Ser 0.80 0.44 - 1.00 mg/dL   Calcium 9.4 8.9 - 10.3 mg/dL   Total Protein 7.5 6.5 - 8.1 g/dL   Albumin 3.7 3.5 - 5.0 g/dL   AST 18 15 - 41 U/L   ALT 13 0 - 44 U/L   Alkaline Phosphatase 45  38 - 126 U/L   Total Bilirubin 0.7 0.3 - 1.2 mg/dL   GFR calc non Af Amer >60 >60 mL/min   GFR calc Af Amer >60 >60 mL/min   Anion gap 14 5 - 15  hCG, quantitative, pregnancy   Collection Time: 10/11/19  8:32 PM  Result Value Ref Range   hCG, Beta Chain, Quant, S 18,522 (H) <5 mIU/mL  Urinalysis, Routine w reflex microscopic Urine, Clean Catch   Collection Time: 10/12/19 12:30 AM  Result Value Ref Range   Color, Urine YELLOW YELLOW   APPearance HAZY (A) CLEAR   Specific Gravity, Urine 1.029 1.005 - 1.030   pH 6.0 5.0 - 8.0   Glucose, UA NEGATIVE NEGATIVE mg/dL   Hgb urine dipstick NEGATIVE NEGATIVE   Bilirubin Urine NEGATIVE NEGATIVE   Ketones, ur 80 (A) NEGATIVE mg/dL   Protein, ur 100 (A) NEGATIVE mg/dL   Nitrite NEGATIVE NEGATIVE   Leukocytes,Ua NEGATIVE NEGATIVE   RBC / HPF 0-5 0 - 5 RBC/hpf   WBC, UA 0-5 0 - 5 WBC/hpf   Bacteria, UA RARE (A) NONE SEEN   Squamous Epithelial / LPF 11-20 0 - 5   Mucus PRESENT   Urine rapid drug screen (hosp performed)   Collection Time: 10/12/19 12:30 AM  Result Value Ref Range   Opiates NONE DETECTED NONE DETECTED   Cocaine NONE DETECTED NONE DETECTED   Benzodiazepines NONE DETECTED NONE DETECTED   Amphetamines NONE DETECTED NONE DETECTED   Tetrahydrocannabinol POSITIVE (A) NONE DETECTED   Barbiturates NONE DETECTED NONE DETECTED  SARS Coronavirus 2 by RT PCR (hospital order, performed in [place] hospital lab) Nasopharyngeal Nasopharyngeal Swab   Collection Time: 10/12/19  1:23 AM   Specimen: Nasopharyngeal Swab  Result Value Ref Range   SARS Coronavirus 2 NEGATIVE NEGATIVE  Type and screen [place]   Collection Time: 10/12/19  3:00 AM  Result Value Ref Range   ABO/RH(D) B POS    Antibody Screen POS    Sample Expiration 10/15/2019,2359    Antibody Identification ANTI LEA Bobby Rumpf a)    Unit Number [id number]    Blood Component Type RED CELLS,LR    Unit division 00    Status of Unit ALLOCATED     Transfusion Status OK TO TRANSFUSE    Crossmatch Result COMPATIBLE    Unit Number [id number]    Blood Component Type RED CELLS,LR    Unit division 00    Status of Unit ALLOCATED    Transfusion Status OK TO TRANSFUSE    Crossmatch Result COMPATIBLE   BPAM RBC   Collection Time: 10/12/19  3:00 AM  Result Value Ref Range   Blood Product Unit Number [id number]    PRODUCT CODE [id number]    Unit Type and Rh 7300    Blood  Product Expiration Date [id number]    Blood Product Unit Number [id number]    PRODUCT CODE E0382V00    Unit Type and Rh 7300    Blood Product Expiration Date [id number]   Comprehensive metabolic panel   Collection Time: 10/13/19  6:34 AM  Result Value Ref Range   Sodium 136 135 - 145 mmol/L   Potassium 2.8 (L) 3.5 - 5.1 mmol/L   Chloride 104 98 - 111 mmol/L   CO2 23 22 - 32 mmol/L   Glucose, Bld 112 (H) 70 - 99 mg/dL   BUN 8 6 - 20 mg/dL   Creatinine, Ser 0.83 0.44 - 1.00 mg/dL   Calcium 8.7 (L) 8.9 - 10.3 mg/dL   Total Protein 5.7 (L) 6.5 - 8.1 g/dL   Albumin 2.8 (L) 3.5 - 5.0 g/dL   AST 16 15 - 41 U/L   ALT 13 0 - 44 U/L   Alkaline Phosphatase 37 (L) 38 - 126 U/L   Total Bilirubin 0.6 0.3 - 1.2 mg/dL   GFR calc non Af Amer >60 >60 mL/min   GFR calc Af Amer >60 >60 mL/min   Anion gap 9 5 - 15  Basic metabolic panel   Collection Time: 10/14/19  5:16 AM  Result Value Ref Range   Sodium 138 135 - 145 mmol/L   Potassium 3.4 (L) 3.5 - 5.1 mmol/L   Chloride 105 98 - 111 mmol/L   CO2 24 22 - 32 mmol/L   Glucose, Bld 101 (H) 70 - 99 mg/dL   BUN 6 6 - 20 mg/dL   Creatinine, Ser 0.85 0.44 - 1.00 mg/dL   Calcium 8.4 (L) 8.9 - 10.3 mg/dL   GFR calc non Af Amer >60 >60 mL/min   GFR calc Af Amer >60 >60 mL/min   Anion gap 9 5 - 15   Discharge Condition: Stable  Disposition: Discharge disposition: 01-Home or Self Care      Discharge Instructions    Discharge activity:  No Restrictions   Complete by: As directed    Discharge diet:  No  restrictions   Complete by: As directed      Allergies as of 10/14/2019   No Known Allergies     Medication List    STOP taking these medications   dicyclomine 20 MG tablet Commonly known as: BENTYL   haloperidol 5 MG tablet Commonly known as: HALDOL   potassium chloride SA 20 MEQ tablet Commonly known as: KLOR-CON     TAKE these medications   Blood Pressure Monitor Kit 1 Device by Does not apply route once a week. To be monitored Regularly at home. What changed: Another medication with the same name was removed. Continue taking this medication, and follow the directions you see here.   famotidine 20 MG tablet Commonly known as: PEPCID Take 1 tablet (20 mg total) by mouth daily.   PRENATAL PO Take 1 tablet by mouth daily.   promethazine 25 MG tablet Commonly known as: PHENERGAN Take 1 tablet (25 mg total) by mouth every 6 (six) hours as needed for nausea or vomiting.   pyridOXINE 25 MG tablet Commonly known as: VITAMIN B-6 Take 1 tablet (25 mg total) by mouth every 8 (eight) hours.       Follow-up Information    [facility] Follow up.   Specialty: Obstetrics and Gynecology Contact information: [address] [place] [phone]  Time spent > 30 min.  Signed: Feliz Beam, M.D. Attending [facility] [employer] (Faculty Practice)  10/14/2019, 10:18 AM

## 2019-10-14 NOTE — Discharge Instructions (Signed)
Cannabinoid Hyperemesis Syndrome Cannabinoid hyperemesis syndrome (CHS) is a condition that causes repeated nausea, vomiting, and abdominal pain after long-term (chronic) use of marijuana (cannabis). People with CHS typically use marijuana 3-5 times a day for many years before they have symptoms, although it is possible to develop CHS with as little as 1 use per day. Symptoms of CHS may be mild at first but can get worse and more frequent. In some cases, CHS may cause vomiting many times a day, which can lead to weight loss and dehydration. CHS may go away and come back many times (recur). People may not have symptoms or may otherwise be healthy in between CHS attacks. What are the causes? The exact cause of this condition is not known. Long-term use of marijuana may over-stimulate certain proteins in the brain that react with chemicals in marijuana (cannabinoid receptors). This over-stimulation may cause CHS. What are the signs or symptoms? Symptoms of this condition are often mild during the first few attacks, but they can get worse over time. Symptoms may include:  Frequent nausea, especially early in the morning.  Vomiting.  Abdominal pain. Taking several hot showers throughout the day can also be a sign of this condition. People with CHS may do this because it relieves symptoms. How is this diagnosed? This condition may be diagnosed based on:  Your symptoms and medical history, including any drug use.  A physical exam. You may have tests done to rule out other problems. These tests may include:  Blood tests.  Urine tests.  Imaging tests, such as an X-ray or CT scan. How is this treated? Treatment for this condition involves stopping marijuana use. Your health care provider may recommend:  A drug rehabilitation program, if you have trouble stopping marijuana use.  Medicines for nausea.  Hot showers to help relieve symptoms. Certain creams that contain a substance called  capsaicin may improve symptoms when applied to the abdomen. Ask your health care provider before starting any medicines or other treatments. Severe nausea and vomiting may require you to stay at the hospital. You may need IV fluids to prevent or treat dehydration. You may also need certain medicines that must be given at the hospital. Follow these instructions at home: During an attack   Stay in bed and rest in a dark, quiet room.  Take anti-nausea medicine as told by your health care provider.  Try taking hot showers to relieve your symptoms. After an attack  Drink small amounts of clear fluids slowly. Gradually add more.  Once you are able to eat without vomiting, eat soft foods in small amounts every 3-4 hours. General instructions   Do not use any products that contain marijuana.If you need help quitting, ask your health care provider for resources and treatment options.  Drink enough fluid to keep your urine pale yellow. Avoid drinking fluids that have a lot of sugar or caffeine, such as coffee and soda.  Take and apply over-the-counter and prescription medicines only as told by your health care provider. Ask your health care provider before starting any new medicines or treatments.  Keep all follow-up visits as told by your health care provider. This is important. Contact a health care provider if:  Your symptoms get worse.  You cannot drink fluids without vomiting.  You have pain and trouble swallowing after an attack. Get help right away if:  You cannot stop vomiting.  You have blood in your vomit or your vomit looks like coffee grounds.  You have   severe abdominal pain.  You have stools that are bloody or black, or stools that look like tar.  You have symptoms of dehydration, such as: ? Sunken eyes. ? Inability to make tears. ? Cracked lips. ? Dry mouth. ? Decreased urine production. ? Weakness. ? Sleepiness. ? Fainting. Summary  Cannabinoid hyperemesis  syndrome (CHS) is a condition that causes repeated nausea, vomiting, and abdominal pain after long-term use of marijuana.  People with CHS typically use marijuana 3-5 times a day for many years before they have symptoms, although it is possible to develop CHS with as little as 1 use per day.  Treatment for this condition involves stopping marijuana use. Hot showers and capsaicin creams may also help relieve symptoms. Ask your health care provider before starting any medicines or other treatments.  Your health care provider may prescribe medicines to help with nausea.  Get help right away if you have signs of dehydration, such as dry mouth, decreased urine production, or weakness. This information is not intended to replace advice given to you by your health care provider. Make sure you discuss any questions you have with your health care provider. Document Revised: 05/18/2017 Document Reviewed: 04/19/2016 Elsevier Patient Education  2020 [employer]. Marijuana Use During Pregnancy and Breastfeeding  Marijuana is the dried leaves, flowers, and stems of the Cannabis sativa or Cannabis indica plant. The plant's active ingredients (cannabinoids), including a chemical called THC, change the chemistry of the brain. Marijuana smoke also has many of the same chemicals as cigarette smoke that cause breathing problems. Marijuana gets into your blood through your lungs when you smoke it and through your digestive system when you swallow it. Using marijuana in any form may be harmful for you and your baby when you are trying to become pregnant and during pregnancy. This includes marijuana that is prescribed to you by a health care provider (medical marijuana). Once marijuana is in your blood, it can travel through your placenta to your baby. It may also pass through breast milk. How does this affect me? Marijuana affects you both mentally and physically. Using marijuana can make you feel high and relaxed. It  can also have negative effects, especially at high doses or with long-term use. These include:  Rapid heartbeat and stress on your heart.  Lung irritation and breathing problems.  Difficulty thinking and making decisions.  Seeing or believing things that are not true (hallucinations and paranoia).  Mood swings, depression, or anxiety.  Decreased ability to learn and remember.  Difficulty getting pregnant. Marijuana can also affect your pregnancy. Not all the effects are known. However, if you use marijuana during pregnancy, you may:  Be less likely to get regular prenatal care and do the things that you need to do to have a healthy pregnancy.  Be more likely to use other drugs that can harm your pregnancy, like drinking alcohol and smoking cigarettes.  Be at higher risk of having your baby die after 28 weeks of pregnancy (stillbirth).  Be at higher risk of giving birth before 37 weeks of pregnancy (premature birth). How does this affect my baby? If you use marijuana during pregnancy, this may affect your baby's development, birth, and life after birth. Your baby may:  Be born prematurely, which can cause physical and mental problems.  Be born with a low birth weight, which can lead to physical and mental problems.  Have problems with brain development.  Have difficulty growing.  Have attention and behavior problems later in life.  Do poorly at school and have learning problems later in life.  Have problems with vision and coordination.  Be at higher risk for using marijuana by age 11. More research is needed to find out exactly how marijuana affects a baby during breastfeeding. Some studies suggest that the chemicals in marijuana can be passed to a baby through breast milk. To limit possible risks, you should not use marijuana during breastfeeding. Follow these instructions at home:  Let your health care provider know if you use marijuana before trying to get pregnant,  during pregnancy, or during breastfeeding.  Do not use marijuana in any form when you are trying to get pregnant, when you are pregnant, or when you are breastfeeding. If you are having trouble stopping marijuana use, ask your health care provider for help.  Do not smoke. If you need help quitting, ask your health care provider for help.  If you are using medical marijuana, ask your health care provider to switch you to a medicine that is safer to use during pregnancy or breastfeeding.  Keep all your prenatal visits as told by your health care provider. This is important. Where to find more information [employer] on Drug Abuse: www.drugabuse.gov March of Dimes: www.marchofdimes.org/pregnancy Contact a health care provider if:  You use marijuana and want to get pregnant.  You use marijuana during pregnancy or breastfeeding.  You need help stopping marijuana use. Get help right away if:  Your baby is not gaining weight or growing as expected. Summary  Using marijuana in any form may be harmful for you and your baby when you are trying to become pregnant, during pregnancy, and during breastfeeding. This includes marijuana that is prescribed to you (medical marijuana).  Some studies suggest that marijuana may pass through breast milk and can affect your baby's brain development.  Talk to your health care provider if you use marijuana in any form while trying to get pregnant, during pregnancy, or while breastfeeding.  Ask your health care provider for help if you are not able to stop using marijuana. This information is not intended to replace advice given to you by your health care provider. Make sure you discuss any questions you have with your health care provider. Document Revised: 05/03/2018 Document Reviewed: 09/27/2016 Elsevier Patient Education  2020 [employer].

## 2019-10-15 ENCOUNTER — Encounter: Payer: Self-pay | Admitting: Obstetrics and Gynecology

## 2019-10-15 DIAGNOSIS — O361990 Maternal care for other isoimmunization, unspecified trimester, not applicable or unspecified: Secondary | ICD-10-CM | POA: Insufficient documentation

## 2019-10-15 LAB — TYPE AND SCREEN
ABO/RH(D): B POS
Antibody Screen: POSITIVE
Unit division: 0
Unit division: 0

## 2019-10-15 LAB — BPAM RBC
Blood Product Expiration Date: 202110162359
Blood Product Expiration Date: 202110162359
Unit Type and Rh: 7300
Unit Type and Rh: 7300

## 2019-10-28 ENCOUNTER — Encounter: Payer: Self-pay | Admitting: Obstetrics and Gynecology

## 2019-10-28 ENCOUNTER — Other Ambulatory Visit: Payer: Self-pay

## 2019-10-28 ENCOUNTER — Ambulatory Visit: Payer: Medicaid Other | Admitting: Obstetrics and Gynecology

## 2019-10-28 VITALS — BP 111/73 | HR 86 | Wt 279.0 lb

## 2019-10-28 DIAGNOSIS — R112 Nausea with vomiting, unspecified: Secondary | ICD-10-CM

## 2019-10-28 DIAGNOSIS — F1290 Cannabis use, unspecified, uncomplicated: Secondary | ICD-10-CM

## 2019-10-28 NOTE — Progress Notes (Signed)
[redacted]w[redacted]d ROB presents for FU from Hospital.  C/o NV however she has not picked up her medications.  She stated that she has stopped smoking.  Her NOB visit will be 11/11/2019.

## 2019-10-28 NOTE — Progress Notes (Signed)
   Subjective:    Patient ID: Jacqueline Shepherd, female    DOB: [birth year], 29 y.o.   MRN: [id number]  HPI Patient is follow up from hospital admission for cannabinoid hyperemesis syndrome.  Currently, the patient is without complaints.  She states that she occasionally experiences some mild nausea without vomiting.  She has not picked up her prescriptions.  The patient denies any abdominal pain, vaginal bleeding, fever or chills.  [name] no longer smoking marijuana.      Review of Systems Other than HPI, the complete review of symptoms are negative.      Objective:   Physical Exam Vitals reviewed.  Constitutional:      General: [name] not in acute distress.    Appearance: Normal appearance.  HENT:     Head: Normocephalic and atraumatic.  Cardiovascular:     Rate and Rhythm: Normal rate and regular rhythm.     Pulses: Normal pulses.  Pulmonary:     Breath sounds: Normal breath sounds.  Abdominal:     General: Abdomen is flat. Bowel sounds are normal.     Palpations: Abdomen is soft.  Skin:    General: Skin is warm and dry.  Neurological:     Mental Status: [name] alert and oriented to person, place, and time.  Psychiatric:        Mood and Affect: Mood normal.        Behavior: Behavior normal.           Assessment & Plan:   Encounter Diagnosis  Name Primary?  . Cannabinoid hyperemesis syndrome Yes   - Patient to fill antiemetics prescribed from the hospital. - Patient to keep appointment for New OB visit.    Darrin Nipper. [facility], MD

## 2019-10-29 ENCOUNTER — Other Ambulatory Visit: Payer: Self-pay

## 2019-10-29 MED ORDER — PREPLUS 27-1 MG PO TABS: 1.0000 | ORAL_TABLET | Freq: Every day | ORAL | 13 refills | Status: DC

## 2019-10-29 MED ORDER — PROMETHAZINE HCL 25 MG PO TABS: 25.0000 mg | ORAL_TABLET | Freq: Four times a day (QID) | ORAL | 2 refills | Status: DC | PRN

## 2019-10-29 MED ORDER — FAMOTIDINE 20 MG PO TABS: 20.0000 mg | ORAL_TABLET | Freq: Every day | ORAL | 3 refills | Status: DC

## 2019-10-29 MED ORDER — PYRIDOXINE HCL 25 MG PO TABS
25.0000 mg | ORAL_TABLET | Freq: Three times a day (TID) | ORAL | 2 refills | Status: DC
Start: 2019-10-29 — End: 2019-12-11

## 2019-11-11 ENCOUNTER — Other Ambulatory Visit (HOSPITAL_COMMUNITY)
Admission: RE | Admit: 2019-11-11 | Discharge: 2019-11-11 | Disposition: A | Discharge status: Home / Self Care | Payer: Medicaid Other | Source: Ambulatory Visit | Attending: Advanced Practice Midwife | Admitting: Advanced Practice Midwife

## 2019-11-11 ENCOUNTER — Encounter: Payer: Self-pay | Admitting: Obstetrics and Gynecology

## 2019-11-11 ENCOUNTER — Ambulatory Visit (INDEPENDENT_AMBULATORY_CARE_PROVIDER_SITE_OTHER): Payer: Medicaid Other | Admitting: Obstetrics and Gynecology

## 2019-11-11 ENCOUNTER — Other Ambulatory Visit: Payer: Self-pay

## 2019-11-11 VITALS — BP 109/69 | HR 71 | Wt 275.0 lb

## 2019-11-11 DIAGNOSIS — O0990 Supervision of high risk pregnancy, unspecified, unspecified trimester: Secondary | ICD-10-CM

## 2019-11-11 DIAGNOSIS — Z659 Problem related to unspecified psychosocial circumstances: Secondary | ICD-10-CM

## 2019-11-11 DIAGNOSIS — O99210 Obesity complicating pregnancy, unspecified trimester: Secondary | ICD-10-CM

## 2019-11-11 DIAGNOSIS — O0991 Supervision of high risk pregnancy, unspecified, first trimester: Secondary | ICD-10-CM | POA: Diagnosis not present

## 2019-11-11 DIAGNOSIS — O131 Gestational [pregnancy-induced] hypertension without significant proteinuria, first trimester: Secondary | ICD-10-CM

## 2019-11-11 DIAGNOSIS — O09899 Supervision of other high risk pregnancies, unspecified trimester: Secondary | ICD-10-CM

## 2019-11-11 DIAGNOSIS — O99891 Other specified diseases and conditions complicating pregnancy: Secondary | ICD-10-CM

## 2019-11-11 DIAGNOSIS — F1290 Cannabis use, unspecified, uncomplicated: Secondary | ICD-10-CM

## 2019-11-11 DIAGNOSIS — R112 Nausea with vomiting, unspecified: Secondary | ICD-10-CM

## 2019-11-11 DIAGNOSIS — O99331 Smoking (tobacco) complicating pregnancy, first trimester: Secondary | ICD-10-CM | POA: Insufficient documentation

## 2019-11-11 DIAGNOSIS — Z283 Underimmunization status: Secondary | ICD-10-CM

## 2019-11-11 DIAGNOSIS — G4709 Other insomnia: Secondary | ICD-10-CM

## 2019-11-11 DIAGNOSIS — Z2839 Other underimmunization status: Secondary | ICD-10-CM

## 2019-11-11 DIAGNOSIS — Z3A10 10 weeks gestation of pregnancy: Secondary | ICD-10-CM | POA: Diagnosis not present

## 2019-11-11 MED ORDER — MELATONIN 3 MG PO CAPS: 1.0000 | ORAL_CAPSULE | Freq: Every evening | ORAL | 3 refills | Status: DC

## 2019-11-11 MED ORDER — ASPIRIN EC 81 MG PO TBEC: 81.0000 mg | DELAYED_RELEASE_TABLET | Freq: Every day | ORAL | 3 refills | Status: DC

## 2019-11-11 NOTE — Progress Notes (Signed)
New OB, c/o pelvic pain 7/10 x 2 weeks, nausea.

## 2019-11-11 NOTE — Progress Notes (Signed)
PRENATAL VISIT NOTE  Subjective:  Jacqueline Shepherd is a 29 y.o. G2P0010 at [redacted]w[redacted]d being seen today for ongoing prenatal care.  [name] currently monitored for the following issues for this high-risk pregnancy and has Rubella non-immune status, antepartum; Supervision of high risk pregnancy, antepartum; Cannabinoid hyperemesis syndrome; Obesity in pregnancy; BMI 40.0-44.9, adult ([place]); Weight loss; Transient hypertension of pregnancy; Lewis isoimmunization during pregnancy; [redacted] weeks gestation of pregnancy; Tobacco use in pregnancy, antepartum, first trimester; and Other social stressor on their problem list.   Negative pap on 08/06/18.   Patient reports minimal appetite but no other concerns.  Contractions: Not present. Vag. Bleeding: None. Denies leaking of fluid. Smoking 2 cigarettes daily (previously 2 ppd). Strong desire to completely quit smoking. Pt currently has a Social worker for pregnancy. Reports notable stress with FOB (recently separately). No safety concerns reported.  The following portions of the patient's history were reviewed and updated as appropriate: allergies, current medications, past family history, past medical history, past social history, past surgical history and problem list.  Objective:   Vitals:   11/11/19 1055  BP: 109/69  Pulse: 71  Weight: 275 lb (124.7 kg)    Fetal Status: +FHTs on Doppler  General:  Alert, oriented and cooperative. Patient is in no acute distress.  Skin: Skin is warm and dry. No rash noted.   Cardiovascular: Normal heart rate noted  Respiratory: Normal respiratory effort, no problems with respiration noted  Abdomen: Soft, gravid, appropriate for gestational age.  Pain/Pressure: Present     Pelvic: Cervical exam deferred        Extremities: Normal range of motion.  Edema: Trace  Mental Status: Normal mood and affect. Normal behavior. Normal judgment and thought content.   Assessment and Plan:  Pregnancy: G2P0010 at [redacted]w[redacted]d 1. Supervision  of high risk pregnancy, antepartum, [redacted] weeks gestation of pregnancy: Pt presents for initial prenatal appointment today. No reported concerns or red flag symptoms. No current medications. Cutting back on tobacco and marijuana use as noted below. - counseled on flu and COVID vaccines; pt declined both today - initiate prenatal vitamins and recommended ASA $RemoveBeforeD'81mg'tNlDzymRLtIDbG$  daily $Remo'@12wk'EBlvI$  given risk factors - Enroll Patient in Babyscripts - Babyscripts Schedule Optimization - CBC/D/Plt+RPR+Rh+ABO+Rub Ab... - Culture, OB Urine - Cervicovaginal ancillary only( [place]) - [place] MFM OB COMP + 14 WK; Future - Genetic screen - plan for f/u prenatal appt in 4 weeks; strict return precautions as noted below  2. Cannabinoid hyperemesis syndrome: Pt recently hospitalized for nausea/vomiting. Reports goal of complete cessation today. No emesis and improved nausea today.  3. Obesity in Pregnancy: pre-preg BMI 44.6. - counseled on healthy diet & regular physical activity - recommended ASA $RemoveBeforeD'81mg'NOmAmBqZmjgiJh$  daily $Remo'@12wk'ZLTfQ$  given risk factors - CMP, TSH, UP:C, A1c today  4. Tobacco Use in pregnancy: Pt reports cutting back tobacco use from 2 ppd prior to pregnancy. Now only smoking 2 cigarettes today with goal for complete cessation. -counseled on benefits of complete tobacco cessation & risks of continued use  5. Rubella non-immune status: Plan for MMR in postpartum period.  6. Social Stressor: Pt reports recently separated from FOB. No safety concerns. Pt reports good support from [employer] (program for single moms) and friends. - provided strict instructions to call if any safety concerns  7. Transient hypertension of pregnancy: BP wnl today.  - instructed pt to purchase a BP cuff & enrolled in Babyscripts - f/u baseline preeclampsia labs as noted above  Preterm labor symptoms, ectopic symptoms, and general obstetric  precautions including but not limited to vaginal bleeding, contractions, leaking of fluid and fetal movement  were reviewed in detail with the patient. Please refer to After Visit Summary for other counseling recommendations.   Return in about 4 weeks (around 12/09/2019) for f/u prenatal appt.  Future Appointments  Date Time Provider [place]  12/09/2019  9:00 AM Cephas Darby, MD [facility] None  01/08/2020 10:45 AM [facility]    Randa Ngo, MD

## 2019-11-11 NOTE — Patient Instructions (Signed)
First Trimester of Pregnancy  The first trimester of pregnancy is from week 1 until the end of week 13 (months 1 through 3). During this time, your baby will begin to develop inside you. At 6-8 weeks, the eyes and face are formed, and the heartbeat can be seen on ultrasound. At the end of 12 weeks, all the baby's organs are formed. Prenatal care is all the medical care you receive before the birth of your baby. Make sure you get good prenatal care and follow all of your doctor's instructions. Follow these instructions at home: Medicines  Take over-the-counter and prescription medicines only as told by your doctor. Some medicines are safe and some medicines are not safe during pregnancy.  Take a prenatal vitamin that contains at least 600 micrograms (mcg) of folic acid.  If you have trouble pooping (constipation), take medicine that will make your stool soft (stool softener) if your doctor approves. Eating and drinking   Eat regular, healthy meals.  Your doctor will tell you the amount of weight gain that is right for you.  Avoid raw meat and uncooked cheese.  If you feel sick to your stomach (nauseous) or throw up (vomit): ? Eat 4 or 5 small meals a day instead of 3 large meals. ? Try eating a few soda crackers. ? Drink liquids between meals instead of during meals.  To prevent constipation: ? Eat foods that are high in fiber, like fresh fruits and vegetables, whole grains, and beans. ? Drink enough fluids to keep your pee (urine) clear or pale yellow. Activity  Exercise only as told by your doctor. Stop exercising if you have cramps or pain in your lower belly (abdomen) or low back.  Do not exercise if it is too hot, too humid, or if you are in a place of great height (high altitude).  Try to avoid standing for long periods of time. Move your legs often if you must stand in one place for a long time.  Avoid heavy lifting.  Wear low-heeled shoes. Sit and stand up  straight.  You can have sex unless your doctor tells you not to. Relieving pain and discomfort  Wear a good support bra if your breasts are sore.  Take warm water baths (sitz baths) to soothe pain or discomfort caused by hemorrhoids. Use hemorrhoid cream if your doctor says it is okay.  Rest with your legs raised if you have leg cramps or low back pain.  If you have puffy, bulging veins (varicose veins) in your legs: ? Wear support hose or compression stockings as told by your doctor. ? Raise (elevate) your feet for 15 minutes, 3-4 times a day. ? Limit salt in your food. Prenatal care  Schedule your prenatal visits by the twelfth week of pregnancy.  Write down your questions. Take them to your prenatal visits.  Keep all your prenatal visits as told by your doctor. This is important. Safety  Wear your seat belt at all times when driving.  Make a list of emergency phone numbers. The list should include numbers for family, friends, the hospital, and police and fire departments. General instructions  Ask your doctor for a referral to a local prenatal class. Begin classes no later than at the start of month 6 of your pregnancy.  Ask for help if you need counseling or if you need help with nutrition. Your doctor can give you advice or tell you where to go for help.  Do not use hot tubs, steam   rooms, or saunas.  Do not douche or use tampons or scented sanitary pads.  Do not cross your legs for long periods of time.  Avoid all herbs and alcohol. Avoid drugs that are not approved by your doctor.  Do not use any tobacco products, including cigarettes, chewing tobacco, and electronic cigarettes. If you need help quitting, ask your doctor. You may get counseling or other support to help you quit.  Avoid cat litter boxes and soil used by cats. These carry germs that can cause birth defects in the baby and can cause a loss of your baby (miscarriage) or stillbirth.  Visit your dentist.  At home, brush your teeth with a soft toothbrush. Be gentle when you floss. Contact a doctor if:  You are dizzy.  You have mild cramps or pressure in your lower belly.  You have a nagging pain in your belly area.  You continue to feel sick to your stomach, you throw up, or you have watery poop (diarrhea).  You have a bad smelling fluid coming from your vagina.  You have pain when you pee (urinate).  You have increased puffiness (swelling) in your face, hands, legs, or ankles. Get help right away if:  You have a fever.  You are leaking fluid from your vagina.  You have spotting or bleeding from your vagina.  You have very bad belly cramping or pain.  You gain or lose weight rapidly.  You throw up blood. It may look like coffee grounds.  You are around people who have German measles, fifth disease, or chickenpox.  You have a very bad headache.  You have shortness of breath.  You have any kind of trauma, such as from a fall or a car accident. Summary  The first trimester of pregnancy is from week 1 until the end of week 13 (months 1 through 3).  To take care of yourself and your unborn baby, you will need to eat healthy meals, take medicines only if your doctor tells you to do so, and do activities that are safe for you and your baby.  Keep all follow-up visits as told by your doctor. This is important as your doctor will have to ensure that your baby is healthy and growing well. This information is not intended to replace advice given to you by your health care provider. Make sure you discuss any questions you have with your health care provider. Document Revised: 05/02/2018 Document Reviewed: 01/18/2016 Elsevier Patient Education  2020 Elsevier Inc.  

## 2019-11-13 LAB — CBC/D/PLT+RPR+RH+ABO+RUB AB...
Basophils Absolute: 0 10*3/uL (ref 0.0–0.2)
Basos: 0 %
EOS (ABSOLUTE): 0.1 10*3/uL (ref 0.0–0.4)
Eos: 1 %
HCV Ab: <0.1 s/co ratio (ref 0.0–0.9)
HIV Screen 4th Generation wRfx: NONREACTIVE
Hematocrit: 33.9 % — ABNORMAL LOW (ref 34.0–46.6)
Hemoglobin: 11.5 g/dL (ref 11.1–15.9)
Hepatitis B Surface Ag: NEGATIVE
Immature Grans (Abs): 0 10*3/uL (ref 0.0–0.1)
Immature Granulocytes: 0 %
Lymphocytes Absolute: 1.7 10*3/uL (ref 0.7–3.1)
Lymphs: 20 %
MCH: 28.1 pg (ref 26.6–33.0)
MCHC: 33.9 g/dL (ref 31.5–35.7)
MCV: 83 fL (ref 79–97)
Monocytes Absolute: 0.5 10*3/uL (ref 0.1–0.9)
Monocytes: 6 %
Neutrophils Absolute: 6 10*3/uL (ref 1.4–7.0)
Neutrophils: 73 %
Platelets: 197 10*3/uL (ref 150–450)
RBC: 4.09 x10E6/uL (ref 3.77–5.28)
RDW: 12.7 % (ref 11.7–15.4)
RPR Ser Ql: NONREACTIVE
Rh Factor: POSITIVE
Rubella Antibodies, IGG: <0.9 index — ABNORMAL LOW (ref >0.99)
WBC: 8.3 10*3/uL (ref 3.4–10.8)

## 2019-11-13 LAB — COMPREHENSIVE METABOLIC PANEL
ALT: 9 IU/L (ref 0–32)
AST: 9 IU/L (ref 0–40)
Albumin/Globulin Ratio: 1.3 (ref 1.2–2.2)
Albumin: 3.8 g/dL — ABNORMAL LOW (ref 3.9–5.0)
Alkaline Phosphatase: 52 IU/L (ref 44–121)
BUN/Creatinine Ratio: 8 — ABNORMAL LOW (ref 9–23)
BUN: 5 mg/dL — ABNORMAL LOW (ref 6–20)
Bilirubin Total: 0.3 mg/dL (ref 0.0–1.2)
CO2: 18 mmol/L — ABNORMAL LOW (ref 20–29)
Calcium: 9.1 mg/dL (ref 8.7–10.2)
Chloride: 104 mmol/L (ref 96–106)
Creatinine, Ser: 0.62 mg/dL (ref 0.57–1.00)
GFR calc Af Amer: 141 mL/min/{1.73_m2} (ref >59)
GFR calc non Af Amer: 122 mL/min/{1.73_m2} (ref >59)
Globulin, Total: 2.9 g/dL (ref 1.5–4.5)
Glucose: 101 mg/dL — ABNORMAL HIGH (ref 65–99)
Potassium: 4 mmol/L (ref 3.5–5.2)
Sodium: 137 mmol/L (ref 134–144)
Total Protein: 6.7 g/dL (ref 6.0–8.5)

## 2019-11-13 LAB — CERVICOVAGINAL ANCILLARY ONLY
Bacterial Vaginitis-Urine: NEGATIVE
Candida Urine: POSITIVE — AB
Chlamydia: NEGATIVE
Comment: NEGATIVE
Comment: NEGATIVE
Comment: NORMAL
Neisseria Gonorrhea: NEGATIVE
Trichomonas: NEGATIVE

## 2019-11-13 LAB — PROTEIN / CREATININE RATIO, URINE
Creatinine, Urine: 142.3 mg/dL
Protein, Ur: 18.5 mg/dL
Protein/Creat Ratio: 130 mg/g creat (ref 0–200)

## 2019-11-13 LAB — CULTURE, OB URINE

## 2019-11-13 LAB — AB SCR+ANTIBODY ID: Antibody Screen: POSITIVE — AB

## 2019-11-13 LAB — HCV INTERPRETATION

## 2019-11-13 LAB — TSH: TSH: 0.25 u[IU]/mL — ABNORMAL LOW (ref 0.450–4.500)

## 2019-11-13 LAB — HEMOGLOBIN A1C
Est. average glucose Bld gHb Est-mCnc: 105 mg/dL
Hgb A1c MFr Bld: 5.3 % (ref 4.8–5.6)

## 2019-11-13 LAB — URINE CULTURE, OB REFLEX

## 2019-11-17 ENCOUNTER — Encounter: Payer: Self-pay | Admitting: Obstetrics and Gynecology

## 2019-11-21 ENCOUNTER — Encounter (HOSPITAL_COMMUNITY): Payer: Self-pay

## 2019-11-21 ENCOUNTER — Encounter: Payer: Self-pay | Admitting: Obstetrics and Gynecology

## 2019-11-21 ENCOUNTER — Other Ambulatory Visit: Payer: Self-pay

## 2019-11-21 ENCOUNTER — Observation Stay (HOSPITAL_COMMUNITY)
Admission: EM | Admit: 2019-11-21 | Discharge: 2019-11-22 | Disposition: A | Discharge status: Home / Self Care | Payer: Medicaid Other | Attending: Emergency Medicine | Admitting: Emergency Medicine

## 2019-11-21 DIAGNOSIS — F17210 Nicotine dependence, cigarettes, uncomplicated: Secondary | ICD-10-CM | POA: Insufficient documentation

## 2019-11-21 DIAGNOSIS — O99331 Smoking (tobacco) complicating pregnancy, first trimester: Secondary | ICD-10-CM | POA: Insufficient documentation

## 2019-11-21 DIAGNOSIS — F12188 Cannabis abuse with other cannabis-induced disorder: Secondary | ICD-10-CM | POA: Diagnosis present

## 2019-11-21 DIAGNOSIS — O99321 Drug use complicating pregnancy, first trimester: Secondary | ICD-10-CM | POA: Diagnosis present

## 2019-11-21 DIAGNOSIS — R112 Nausea with vomiting, unspecified: Secondary | ICD-10-CM

## 2019-11-21 DIAGNOSIS — F1290 Cannabis use, unspecified, uncomplicated: Secondary | ICD-10-CM

## 2019-11-21 DIAGNOSIS — Z7982 Long term (current) use of aspirin: Secondary | ICD-10-CM | POA: Diagnosis not present

## 2019-11-21 DIAGNOSIS — Z3A12 12 weeks gestation of pregnancy: Secondary | ICD-10-CM

## 2019-11-21 DIAGNOSIS — O210 Mild hyperemesis gravidarum: Secondary | ICD-10-CM

## 2019-11-21 DIAGNOSIS — E669 Obesity, unspecified: Secondary | ICD-10-CM | POA: Diagnosis present

## 2019-11-21 DIAGNOSIS — O361910 Maternal care for other isoimmunization, first trimester, not applicable or unspecified: Secondary | ICD-10-CM | POA: Diagnosis present

## 2019-11-21 DIAGNOSIS — O218 Other vomiting complicating pregnancy: Secondary | ICD-10-CM | POA: Diagnosis present

## 2019-11-21 DIAGNOSIS — O99211 Obesity complicating pregnancy, first trimester: Secondary | ICD-10-CM | POA: Diagnosis present

## 2019-11-21 DIAGNOSIS — O219 Vomiting of pregnancy, unspecified: Secondary | ICD-10-CM

## 2019-11-21 LAB — COMPREHENSIVE METABOLIC PANEL
ALT: 12 U/L (ref 0–44)
AST: 16 U/L (ref 15–41)
Albumin: 3.9 g/dL (ref 3.5–5.0)
Alkaline Phosphatase: 43 U/L (ref 38–126)
Anion gap: 10 (ref 5–15)
BUN: 7 mg/dL (ref 6–20)
CO2: 20 mmol/L — ABNORMAL LOW (ref 22–32)
Calcium: 8.9 mg/dL (ref 8.9–10.3)
Chloride: 104 mmol/L (ref 98–111)
Creatinine, Ser: 0.57 mg/dL (ref 0.44–1.00)
GFR, Estimated: >60 mL/min (ref >60)
Glucose, Bld: 149 mg/dL — ABNORMAL HIGH (ref 70–99)
Potassium: 3.7 mmol/L (ref 3.5–5.1)
Sodium: 134 mmol/L — ABNORMAL LOW (ref 135–145)
Total Bilirubin: 0.6 mg/dL (ref 0.3–1.2)
Total Protein: 8.1 g/dL (ref 6.5–8.1)

## 2019-11-21 LAB — CBC WITH DIFFERENTIAL/PLATELET
Abs Immature Granulocytes: 0.04 10*3/uL (ref 0.00–0.07)
Basophils Absolute: 0 10*3/uL (ref 0.0–0.1)
Basophils Relative: 0 %
Eosinophils Absolute: 0 10*3/uL (ref 0.0–0.5)
Eosinophils Relative: 0 %
HCT: 38.5 % (ref 36.0–46.0)
Hemoglobin: 12.4 g/dL (ref 12.0–15.0)
Immature Granulocytes: 0 %
Lymphocytes Relative: 14 %
Lymphs Abs: 1.6 10*3/uL (ref 0.7–4.0)
MCH: 27 pg (ref 26.0–34.0)
MCHC: 32.2 g/dL (ref 30.0–36.0)
MCV: 83.7 fL (ref 80.0–100.0)
Monocytes Absolute: 0.5 10*3/uL (ref 0.1–1.0)
Monocytes Relative: 5 %
Neutro Abs: 9.1 10*3/uL — ABNORMAL HIGH (ref 1.7–7.7)
Neutrophils Relative %: 81 %
Platelets: 237 10*3/uL (ref 150–400)
RBC: 4.6 MIL/uL (ref 3.87–5.11)
RDW: 13.7 % (ref 11.5–15.5)
WBC: 11.4 10*3/uL — ABNORMAL HIGH (ref 4.0–10.5)
nRBC: 0 % (ref 0.0–0.2)

## 2019-11-21 LAB — URINALYSIS, ROUTINE W REFLEX MICROSCOPIC
Bilirubin Urine: NEGATIVE
Glucose, UA: 150 mg/dL — AB
Hgb urine dipstick: NEGATIVE
Ketones, ur: 5 mg/dL — AB
Leukocytes,Ua: NEGATIVE
Nitrite: NEGATIVE
Protein, ur: NEGATIVE mg/dL
Specific Gravity, Urine: 1.017 (ref 1.005–1.030)
pH: 9 — ABNORMAL HIGH (ref 5.0–8.0)

## 2019-11-21 LAB — MAGNESIUM: Magnesium: 1.9 mg/dL (ref 1.7–2.4)

## 2019-11-21 MED ORDER — PROMETHAZINE HCL 25 MG/ML IJ SOLN
25.0000 mg | Freq: Once | INTRAMUSCULAR | Status: AC
  Administered 2019-11-21: 25 mg via INTRAVENOUS
  Filled 2019-11-21: qty 1

## 2019-11-21 MED ORDER — LACTATED RINGERS IV BOLUS
1000.0000 mL | Freq: Once | INTRAVENOUS | Status: AC
  Administered 2019-11-21: 1000 mL via INTRAVENOUS

## 2019-11-21 MED ORDER — LACTATED RINGERS IV BOLUS
500.0000 mL | Freq: Once | INTRAVENOUS | Status: AC
  Administered 2019-11-21: 500 mL via INTRAVENOUS

## 2019-11-21 MED ORDER — METOCLOPRAMIDE HCL 5 MG/ML IJ SOLN
10.0000 mg | Freq: Once | INTRAMUSCULAR | Status: AC
  Administered 2019-11-21: 10 mg via INTRAVENOUS
  Filled 2019-11-21: qty 2

## 2019-11-21 NOTE — ED Provider Notes (Signed)
[name] DEPT Provider Note   CSN: [id number] Arrival date & time: 11/21/19  1637     History Chief Complaint  Patient presents with   Emesis During Pregnancy    Jacqueline Shepherd is a 29 y.o. female currently 12w 1d gestation G2P0010 with past medical history significant for abdominal pain, cannabinoid hyperemesis syndrome.   HPI Patient presents to emergency department today with chief complaint of emesis during pregnancy x1 day.  Patient states approximately 6 hours prior to arrival she had sudden onset of nausea and emesis.  The emesis has persisted even though she tried taking Haldol and Phenergan.  She states she has had over 10 episodes of nonbloody nonbilious emesis.  She admits to last smoking marijuana x 3 days ago, she states she is trying to cut back because she is pregnant.  She states because of all the episodes of emesis she developed a headache.  Her headache has progressively worsened since onset.  She denies sudden onset of headache, neck pain or stiffness.  She also denies fever, syncope, head trauma, photophobia, phonophobia, UL throbbing, visual changes, stiff neck, rash.      Past Medical History:  Diagnosis Date   Abdominal pain    Cannabinoid hyperemesis syndrome    Chlamydia infection complicating pregnancy in second trimester 08/18/2018   Negative test of cure on 08/06/18    Patient Active Problem List   Diagnosis Date Noted   [redacted] weeks gestation of pregnancy 11/11/2019   Tobacco use in pregnancy, antepartum, first trimester 11/11/2019   Other social stressor 11/11/2019   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   Obesity in pregnancy 10/12/2019   BMI 40.0-44.9, adult ([place]) 10/12/2019   Weight loss 10/12/2019   Transient hypertension of pregnancy 10/12/2019   Cannabinoid hyperemesis syndrome    Supervision of high risk pregnancy, antepartum 10/06/2019   Rubella non-immune status, antepartum  08/18/2018    Past Surgical History:  Procedure Laterality Date   WISDOM TOOTH EXTRACTION       OB History    Gravida  2   Para      Term      Preterm      AB  1   Living        SAB  1   TAB      Ectopic      Multiple      Live Births              Family History  Problem Relation Age of Onset   Diabetes Mother    Hypertension Mother     Social History   Tobacco Use   Smoking status: Current Some Day Smoker    Packs/day: 0.50   Smokeless tobacco: Never Used  Vaping Use   Vaping Use: Never used  Substance Use Topics   Alcohol use: No   Drug use: Not on file    Comment: 1/day last used yesterday 10/10/2019    Home Medications Prior to Admission medications   Medication Sig Start Date End Date Taking? Authorizing Provider  aspirin EC 81 MG tablet Take 1 tablet (81 mg total) by mouth daily. Swallow whole. 11/11/19   Randa Ngo, MD  Blood Pressure Monitor KIT 1 Device by Does not apply route once a week. To be monitored Regularly at home. 10/06/19   Constant, Peggy, MD  famotidine (PEPCID) 20 MG tablet Take 1 tablet (20 mg total) by mouth daily. 10/29/19   Constant, Vickii Chafe, MD  Melatonin  3 MG CAPS Take 1 capsule (3 mg total) by mouth at bedtime. 11/11/19   Randa Ngo, MD  Prenatal Vit-Fe Fumarate-FA (PRENATAL PO) Take 1 tablet by mouth daily.    [provider]  Prenatal Vit-Fe Fumarate-FA (PREPLUS) 27-1 MG TABS Take 1 tablet by mouth daily. 10/29/19   Constant, Peggy, MD  promethazine (PHENERGAN) 25 MG tablet Take 1 tablet (25 mg total) by mouth every 6 (six) hours as needed for nausea or vomiting. 10/29/19   Constant, Peggy, MD  pyridOXINE (VITAMIN B-6) 25 MG tablet Take 1 tablet (25 mg total) by mouth every 8 (eight) hours. 10/29/19   Constant, Peggy, MD  fluticasone (FLONASE) 50 MCG/ACT nasal spray Place 1 spray into both nostrils daily. Patient not taking: Reported on 07/03/2019 04/18/19 07/03/19  Henderly, Britni A, PA-C    metoCLOPramide (REGLAN) 10 MG tablet Take 1 tablet (10 mg total) by mouth every 6 (six) hours as needed for nausea (nausea/headache). 10/12/18 04/04/19  Elnora Morrison, MD  pantoprazole (PROTONIX) 20 MG tablet Take 1 tablet (20 mg total) by mouth daily. Patient not taking: Reported on 07/03/2019 04/04/19 07/03/19  Mesner, Corene Cornea, MD    Allergies    Patient has no known allergies.  Review of Systems   Review of Systems All other systems are reviewed and are negative for acute change except as noted in the HPI.  Physical Exam Updated Vital Signs BP (!) 148/90 (BP Location: Right Arm)    Pulse (!) 54    Temp (!) 97.5 F (36.4 C) (Oral)    Resp 18    Ht $R'5\' 7"'bP$  (1.702 m)    Wt 124.7 kg    LMP 09/17/2019    SpO2 100%    BMI 43.07 kg/m   Physical Exam Vitals and nursing note reviewed.  Constitutional:      General: [name] not in acute distress.    Appearance: [name] not ill-appearing.     Comments: Actively vomiting during exam  HENT:     Head: Normocephalic and atraumatic.     Comments: No sinus or temporal tenderness.    Right Ear: Tympanic membrane and external ear normal.     Left Ear: Tympanic membrane and external ear normal.     Nose: Nose normal.     Mouth/Throat:     Mouth: Mucous membranes are dry.     Pharynx: Oropharynx is clear.  Eyes:     General: No scleral icterus.       Right eye: No discharge.        Left eye: No discharge.     Extraocular Movements: Extraocular movements intact.     Conjunctiva/sclera: Conjunctivae normal.     Pupils: Pupils are equal, round, and reactive to light.  Neck:     Vascular: No JVD.     Comments: Full ROM intact without spinous process TTP. No bony stepoffs or deformities, no paraspinous muscle TTP or muscle spasms. No rigidity or meningeal signs. No bruising, erythema, or swelling.  Cardiovascular:     Rate and Rhythm: Normal rate and regular rhythm.     Pulses: Normal pulses.          Radial pulses are 2+ on the right side and 2+ on  the left side.     Heart sounds: Normal heart sounds.  Pulmonary:     Comments: Lungs clear to auscultation in all fields. Symmetric chest rise. No wheezing, rales, or rhonchi. Abdominal:     General: Bowel sounds are  normal.     Tenderness: There is no right CVA tenderness or left CVA tenderness.     Comments: Abdomen is soft, non-distended, and non-tender in all quadrants. No rigidity, no guarding. No peritoneal signs.  Musculoskeletal:        General: Normal range of motion.     Cervical back: Normal range of motion.  Skin:    General: Skin is warm and dry.     Capillary Refill: Capillary refill takes less than 2 seconds.  Neurological:     Mental Status: [name] oriented to person, place, and time.     GCS: GCS eye subscore is 4. GCS verbal subscore is 5. GCS motor subscore is 6.     Comments: Speech is clear and goal oriented, follows commands CN III-XII intact, no facial droop Normal strength in upper and lower extremities bilaterally including dorsiflexion and plantar flexion, strong and equal grip strength Sensation normal to light and sharp touch Moves extremities without ataxia, coordination intact Normal finger to nose and rapid alternating movements Normal gait and balance  Psychiatric:        Behavior: Behavior is uncooperative.     ED Results / Procedures / Treatments   Labs (all labs ordered are listed, but only abnormal results are displayed) Labs Reviewed  COMPREHENSIVE METABOLIC PANEL - Abnormal; Notable for the following components:      Result Value   Sodium 134 (*)    CO2 20 (*)    Glucose, Bld 149 (*)    All other components within normal limits  CBC WITH DIFFERENTIAL/PLATELET - Abnormal; Notable for the following components:   WBC 11.4 (*)    Neutro Abs 9.1 (*)    All other components within normal limits  URINALYSIS, ROUTINE W REFLEX MICROSCOPIC - Abnormal; Notable for the following components:   APPearance TURBID (*)    pH 9.0 (*)    Glucose, UA  150 (*)    Ketones, ur 5 (*)    Bacteria, UA RARE (*)    All other components within normal limits  MAGNESIUM    EKG None  Radiology No results found.  Procedures Procedures (including critical care time)  Medications Ordered in ED Medications  lactated ringers bolus 1,000 mL (0 mLs Intravenous Stopped 11/21/19 2100)  promethazine (PHENERGAN) injection 25 mg (25 mg Intravenous Given 11/21/19 1821)  metoCLOPramide (REGLAN) injection 10 mg (10 mg Intravenous Given 11/21/19 2123)  lactated ringers bolus 500 mL (500 mLs Intravenous New Bag/Given 11/21/19 2122)    ED Course  I have reviewed the triage vital signs and the nursing notes.  Pertinent labs & imaging results that were available during my care of the patient were reviewed by me and considered in my medical decision making (see chart for details).    MDM Rules/Calculators/A&P                          History provided by patient with additional history obtained from chart review.    Patient seen and examined. Patient presents awake, alert, hemodynamically stable, afebrile, non toxic. [name] actively vomiting during exam. [name] difficult to redirect. Abdomen is non tender. She appears dehydrated, mucus membranes are dry. CBC with nonspecific leukocytosis 11.4, possible stress response related to persistent emesis. Low suspicion for infection as she is afebrile and denying infectious symptoms. CMP without significant electrolyte derangement, no renal insufficiency, bicarb slightly low at 20, normal anion gap. UA without signs of infection, does  she 5 ketones again to suggest dehydration.I did discuss the importance of smoking cessation during pregnancy. Patient given 1 L of LR and Iv phenergan. She failed PO challenge. Given another L of LR and IV reglan.   Patient care transferred to R. Browning PA-C at the end of my shift pending successful PO challenge. Patient presentation, ED course, and plan of care discussed with  review of all pertinent labs and imaging. Please see his note for further details regarding further ED course and disposition.   Portions of this note were generated with [employer]. Dictation errors may occur despite best attempts at proofreading.   Final Clinical Impression(s) / ED Diagnoses Final diagnoses:  Nausea and vomiting during pregnancy prior to [redacted] weeks gestation    Rx / DC Orders ED Discharge Orders    None       Flint Melter 11/21/19 2211    Fredia Sorrow, MD 11/21/19 2213

## 2019-11-21 NOTE — Discharge Instructions (Signed)
Follow up with OB as needed  You should stop smoking mariajuana during pregnancy. It likely caused your vomiting today

## 2019-11-21 NOTE — ED Notes (Signed)
Attempt x2 by Duwaine Maxin rn and attempt x1 for Iv by spencer, RN

## 2019-11-21 NOTE — ED Triage Notes (Signed)
3 months pregnant. emesis x10 times per patient. Patient reports taking haldol and phengren at 11am with no relief. Pt also reports headache.

## 2019-11-21 NOTE — ED Provider Notes (Signed)
10:33 PM Patient reassessed by me.  Still vomiting.  Failed PO.  Thought 2/2 cannabinoid hypermesis syndrome.  Discussed with Dr. Deretha Emory, who agrees with plan for transfer to [facility].  Discussed case with Dr. Donavan Foil OB/GYN who advises transfer and direct admit through [facility].  Dr. Donavan Foil accepts in transfer.  Patient appears stable for transfer.   Roxy Horseman, PA-C 11/21/19 2235    Vanetta Mulders, MD 11/25/19 [phone]

## 2019-11-21 NOTE — ED Notes (Signed)
Patient pulled out IV. Explained patient the importance of the IV. PA notified. PA in to examine patient

## 2019-11-21 NOTE — Progress Notes (Signed)
TOC CM chart reviewed. No PCP listed for pt. Noted 6 ED visits in past six months. Pt is being followed by [facility] for pregnancy. Isidoro Donning RN CCM, [facility] ED TOC CM [phone]

## 2019-11-21 NOTE — ED Notes (Signed)
Unsuccessful IV attempt x2.  

## 2019-11-22 ENCOUNTER — Other Ambulatory Visit: Payer: Self-pay

## 2019-11-22 ENCOUNTER — Encounter (HOSPITAL_COMMUNITY): Payer: Self-pay | Admitting: Obstetrics and Gynecology

## 2019-11-22 DIAGNOSIS — O210 Mild hyperemesis gravidarum: Secondary | ICD-10-CM | POA: Diagnosis present

## 2019-11-22 DIAGNOSIS — Z3A12 12 weeks gestation of pregnancy: Secondary | ICD-10-CM

## 2019-11-22 DIAGNOSIS — F1290 Cannabis use, unspecified, uncomplicated: Secondary | ICD-10-CM | POA: Diagnosis not present

## 2019-11-22 DIAGNOSIS — O99321 Drug use complicating pregnancy, first trimester: Secondary | ICD-10-CM | POA: Diagnosis not present

## 2019-11-22 LAB — RAPID URINE DRUG SCREEN, HOSP PERFORMED
Amphetamines: NOT DETECTED
Barbiturates: NOT DETECTED
Benzodiazepines: NOT DETECTED
Cocaine: NOT DETECTED
Opiates: NOT DETECTED
Tetrahydrocannabinol: POSITIVE — AB

## 2019-11-22 MED ORDER — METOCLOPRAMIDE HCL 10 MG PO TABS: 10.0000 mg | ORAL_TABLET | Freq: Four times a day (QID) | ORAL | 0 refills | Status: DC

## 2019-11-22 MED ORDER — THIAMINE HCL 100 MG/ML IJ SOLN
INTRAVENOUS | Status: DC
  Filled 2019-11-22: qty 1000

## 2019-11-22 MED ORDER — PROMETHAZINE HCL 25 MG PO TABS: 12.5000 mg | ORAL_TABLET | ORAL | Status: DC | PRN

## 2019-11-22 MED ORDER — FAMOTIDINE IN NACL 20-0.9 MG/50ML-% IV SOLN
20.0000 mg | Freq: Two times a day (BID) | INTRAVENOUS | Status: DC
  Administered 2019-11-22: 20 mg via INTRAVENOUS
  Filled 2019-11-22: qty 50

## 2019-11-22 MED ORDER — ONDANSETRON 4 MG PO TBDP
4.0000 mg | ORAL_TABLET | Freq: Three times a day (TID) | ORAL | Status: DC | PRN
  Administered 2019-11-22: 8 mg via ORAL
  Filled 2019-11-22: qty 2

## 2019-11-22 MED ORDER — HYDROXYZINE HCL 50 MG PO TABS
50.0000 mg | ORAL_TABLET | Freq: Four times a day (QID) | ORAL | Status: DC | PRN
  Filled 2019-11-22: qty 1

## 2019-11-22 MED ORDER — SCOPOLAMINE 1 MG/3DAYS TD PT72: 1.0000 | MEDICATED_PATCH | TRANSDERMAL | 12 refills | Status: DC

## 2019-11-22 MED ORDER — HYDROXYZINE HCL 50 MG/ML IM SOLN
50.0000 mg | Freq: Four times a day (QID) | INTRAMUSCULAR | Status: DC | PRN
  Administered 2019-11-22: 50 mg via INTRAMUSCULAR
  Filled 2019-11-22 (×2): qty 1

## 2019-11-22 MED ORDER — ONDANSETRON 4 MG PO TBDP: 4.0000 mg | ORAL_TABLET | Freq: Three times a day (TID) | ORAL | 0 refills | Status: DC | PRN

## 2019-11-22 MED ORDER — FAMOTIDINE 20 MG PO TABS: 20.0000 mg | ORAL_TABLET | Freq: Two times a day (BID) | ORAL | Status: DC

## 2019-11-22 MED ORDER — METOCLOPRAMIDE HCL 10 MG PO TABS
10.0000 mg | ORAL_TABLET | Freq: Four times a day (QID) | ORAL | Status: DC
  Filled 2019-11-22: qty 1

## 2019-11-22 MED ORDER — LACTATED RINGERS IV SOLN: INTRAVENOUS | Status: DC

## 2019-11-22 MED ORDER — SODIUM CHLORIDE 0.9 % IV SOLN
8.0000 mg | Freq: Three times a day (TID) | INTRAVENOUS | Status: DC | PRN
  Filled 2019-11-22: qty 4

## 2019-11-22 MED ORDER — ONDANSETRON HCL 4 MG/2ML IJ SOLN: 4.0000 mg | Freq: Three times a day (TID) | INTRAMUSCULAR | Status: DC | PRN

## 2019-11-22 MED ORDER — PROMETHAZINE HCL 25 MG RE SUPP: 12.5000 mg | RECTAL | Status: DC | PRN

## 2019-11-22 MED ORDER — METOCLOPRAMIDE HCL 5 MG/ML IJ SOLN
10.0000 mg | Freq: Four times a day (QID) | INTRAMUSCULAR | Status: DC
  Administered 2019-11-22 (×2): 10 mg via INTRAVENOUS
  Filled 2019-11-22 (×2): qty 2

## 2019-11-22 NOTE — MAU Note (Addendum)
Patient stated she is unable to get up to use the restroom for a urine sample and would like to try later.   Called [facility] [facility] and they are able to run the patient's UDS off of their UA sample from the patient's earlier visit prior to her transfer to [facility]U.

## 2019-11-22 NOTE — H&P (Signed)
FACULTY PRACTICE ANTEPARTUM ADMISSION HISTORY AND PHYSICAL NOTE Please disregard this note.  A completed H and P was already done.  History of Present Illness: Jacqueline Shepherd is a 29 y.o. G2P0010 at 5w2dadmitted for hyperemesis.  The patient noted her nausea started at 11 AM and    Patient Active Problem List   Diagnosis Date Noted  . Hyperemesis gravidarum, antepartum 11/22/2019  . [redacted] weeks gestation of pregnancy 11/11/2019  . Tobacco use in pregnancy, antepartum, first trimester 11/11/2019  . Other social stressor 11/11/2019  . 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 Obesity in pregnancy 10/12/2019  . BMI 40.0-44.9, adult ([place] 10/12/2019  . Weight loss 10/12/2019  . Transient hypertension of pregnancy 10/12/2019  . Cannabinoid hyperemesis syndrome   . Supervision of high risk pregnancy, antepartum 10/06/2019  . Rubella non-immune status, antepartum 08/18/2018    Past Medical History:  Diagnosis Date  . Abdominal pain   . Cannabinoid hyperemesis syndrome   . Chlamydia infection complicating pregnancy in second trimester 08/18/2018   Negative test of cure on 08/06/18    Past Surgical History:  Procedure Laterality Date  . WISDOM TOOTH EXTRACTION      OB History  Gravida Para Term Preterm AB Living  2       1    SAB TAB Ectopic Multiple Live Births  1            # Outcome Date GA Lbr Len/2nd Weight Sex Delivery Anes PTL Lv  2 Current           1 SAB             Social History   Socioeconomic History  . Marital status: Single    Spouse name: Not on file  . Number of children: Not on file  . Years of education: Not on file  . Highest education level: Not on file  Occupational History  . Not on file  Tobacco Use  . Smoking status: Current Some Day Smoker    Packs/day: 0.50  . Smokeless tobacco: Never Used  Vaping Use  . Vaping Use: Never used  Substance and Sexual Activity  . Alcohol use: No  . Drug use: Not on file    Comment: 1/day  last used yesterday 11/22/2019  . Sexual activity: Yes    Partners: Male    Birth control/protection: None    Comment: Pregnant   Other Topics Concern  . Not on file  Social History Narrative  . Not on file   Social Determinants of Health   Financial Resource Strain:   . Difficulty of Paying Living Expenses: Not on file  Food Insecurity:   . Worried About RCharity fundraiserin the Last Year: Not on file  . Ran Out of Food in the Last Year: Not on file  Transportation Needs:   . Lack of Transportation (Medical): Not on file  . Lack of Transportation (Non-Medical): Not on file  Physical Activity:   . Days of Exercise per Week: Not on file  . Minutes of Exercise per Session: Not on file  Stress:   . Feeling of Stress : Not on file  Social Connections:   . Frequency of Communication with Friends and Family: Not on file  . Frequency of Social Gatherings with Friends and Family: Not on file  . Attends Religious Services: Not on file  . Active Member of Clubs or Organizations: Not on file  . Attends CArchivistMeetings:  Not on file  . Marital Status: Not on file    Family History  Problem Relation Age of Onset  . Diabetes Mother   . Hypertension Mother     No Known Allergies  Medications Prior to Admission  Medication Sig Dispense Refill Last Dose  . promethazine (PHENERGAN) 25 MG tablet Take 1 tablet (25 mg total) by mouth every 6 (six) hours as needed for nausea or vomiting. 30 tablet 2 11/22/2019 at 1800  . aspirin EC 81 MG tablet Take 1 tablet (81 mg total) by mouth daily. Swallow whole. 90 tablet 3   . Blood Pressure Monitor KIT 1 Device by Does not apply route once a week. To be monitored Regularly at home. 1 kit 0   . famotidine (PEPCID) 20 MG tablet Take 1 tablet (20 mg total) by mouth daily. 30 tablet 3   . Melatonin 3 MG CAPS Take 1 capsule (3 mg total) by mouth at bedtime. 30 capsule 3   . Prenatal Vit-Fe Fumarate-FA (PRENATAL PO) Take 1 tablet by mouth  daily.     . Prenatal Vit-Fe Fumarate-FA (PREPLUS) 27-1 MG TABS Take 1 tablet by mouth daily. 30 tablet 13   . pyridOXINE (VITAMIN B-6) 25 MG tablet Take 1 tablet (25 mg total) by mouth every 8 (eight) hours. 90 tablet 2      Vitals:  BP (!) 142/74 (BP Location: Right Arm)   Pulse (!) 55   Temp 98.4 F (36.9 C) (Oral)   Resp 19   Ht '5\' 7"'  (1.702 m)   Wt 124.7 kg   LMP 09/17/2019   SpO2 100%   BMI 43.06 kg/m  Physical Examination: CONSTITUTIONAL: Well-developed, well-nourished female in no acute distress.  HENT:  Normocephalic, atraumatic, External right and left ear normal. Oropharynx is clear and moist EYES: Conjunctivae and EOM are normal. Pupils are equal, round, and reactive to light. No scleral icterus.  NECK: Normal range of motion, supple, no masses SKIN: Skin is warm and dry. No rash noted. Not diaphoretic. No erythema. No pallor. [place]: Alert and oriented to person, place, and time. Normal reflexes, muscle tone coordination. No cranial nerve deficit noted. PSYCHIATRIC: Normal mood and affect. Normal behavior. Normal judgment and thought content. CARDIOVASCULAR: Normal heart rate noted, regular rhythm RESPIRATORY: Effort and breath sounds normal, no problems with respiration noted ABDOMEN: Soft, nontender, nondistended, gravid. MUSCULOSKELETAL: Normal range of motion. No edema and no tenderness. 2+ distal pulses.   Labs:  Results for orders placed or performed during the hospital encounter of 11/21/19 (from the past 24 hour(s))  Comprehensive metabolic panel   Collection Time: 11/21/19  5:01 PM  Result Value Ref Range   Sodium 134 (L) 135 - 145 mmol/L   Potassium 3.7 3.5 - 5.1 mmol/L   Chloride 104 98 - 111 mmol/L   CO2 20 (L) 22 - 32 mmol/L   Glucose, Bld 149 (H) 70 - 99 mg/dL   BUN 7 6 - 20 mg/dL   Creatinine, Ser 0.57 0.44 - 1.00 mg/dL   Calcium 8.9 8.9 - 10.3 mg/dL   Total Protein 8.1 6.5 - 8.1 g/dL   Albumin 3.9 3.5 - 5.0 g/dL   AST 16 15 - 41 U/L   ALT  12 0 - 44 U/L   Alkaline Phosphatase 43 38 - 126 U/L   Total Bilirubin 0.6 0.3 - 1.2 mg/dL   GFR, Estimated >60 >60 mL/min   Anion gap 10 5 - 15  CBC with Differential   Collection Time:  11/21/19  5:01 PM  Result Value Ref Range   WBC 11.4 (H) 4.0 - 10.5 K/uL   RBC 4.60 3.87 - 5.11 MIL/uL   Hemoglobin 12.4 12.0 - 15.0 g/dL   HCT 38.5 36 - 46 %   MCV 83.7 80.0 - 100.0 fL   MCH 27.0 26.0 - 34.0 pg   MCHC 32.2 30.0 - 36.0 g/dL   RDW 13.7 11.5 - 15.5 %   Platelets 237 150 - 400 K/uL   nRBC 0.0 0.0 - 0.2 %   Neutrophils Relative % 81 %   Neutro Abs 9.1 (H) 1.7 - 7.7 K/uL   Lymphocytes Relative 14 %   Lymphs Abs 1.6 0.7 - 4.0 K/uL   Monocytes Relative 5 %   Monocytes Absolute 0.5 0.1 - 1.0 K/uL   Eosinophils Relative 0 %   Eosinophils Absolute 0.0 0.0 - 0.5 K/uL   Basophils Relative 0 %   Basophils Absolute 0.0 0.0 - 0.1 K/uL   WBC Morphology VACUOLATED NEUTROPHILS    Immature Granulocytes 0 %   Abs Immature Granulocytes 0.04 0.00 - 0.07 K/uL   Burr Cells PRESENT   Magnesium   Collection Time: 11/21/19  5:01 PM  Result Value Ref Range   Magnesium 1.9 1.7 - 2.4 mg/dL  Urinalysis, Routine w reflex microscopic Urine, Clean Catch   Collection Time: 11/21/19  7:24 PM  Result Value Ref Range   Color, Urine YELLOW YELLOW   APPearance TURBID (A) CLEAR   Specific Gravity, Urine 1.017 1.005 - 1.030   pH 9.0 (H) 5.0 - 8.0   Glucose, UA 150 (A) NEGATIVE mg/dL   Hgb urine dipstick NEGATIVE NEGATIVE   Bilirubin Urine NEGATIVE NEGATIVE   Ketones, ur 5 (A) NEGATIVE mg/dL   Protein, ur NEGATIVE NEGATIVE mg/dL   Nitrite NEGATIVE NEGATIVE   Leukocytes,Ua NEGATIVE NEGATIVE   RBC / HPF 0-5 0 - 5 RBC/hpf   WBC, UA 0-5 0 - 5 WBC/hpf   Bacteria, UA RARE (A) NONE SEEN   Squamous Epithelial / LPF 0-5 0 - 5   Amorphous Crystal PRESENT     Imaging Studies: No results found.   Assessment and Plan: Patient Active Problem List   Diagnosis Date Noted  . Hyperemesis gravidarum, antepartum  11/22/2019  . [redacted] weeks gestation of pregnancy 11/11/2019  . Tobacco use in pregnancy, antepartum, first trimester 11/11/2019  . Other social stressor 11/11/2019  . 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 Obesity in pregnancy 10/12/2019  . BMI 40.0-44.9, adult ([place]) 10/12/2019  . Weight loss 10/12/2019  . Transient hypertension of pregnancy 10/12/2019  . Cannabinoid hyperemesis syndrome   . Supervision of high risk pregnancy, antepartum 10/06/2019  . Rubella non-immune status, antepartum 08/18/2018   Erroneous note, please disregard.

## 2019-11-22 NOTE — MAU Note (Signed)
Successful IV insertion from IV team.

## 2019-11-22 NOTE — Progress Notes (Signed)
Dr. Mayford Knife in MAU to see patient. Plans to d/c home after finishing multivitamin infusion. Patient agreeable to plan of care.   Dr. Mayford Knife in OR and requests CNM to put in discharge.   Rolm Bookbinder, CNM 11/22/19 10:20 AM

## 2019-11-22 NOTE — MAU Note (Signed)
IV therapy in to start IV

## 2019-11-22 NOTE — MAU Note (Signed)
G2P0 arrived via EMS transfer from [facility]. Pt has an EDC of 06/03/2020. Pt reports nausea and vomiting since 3pm today. Denies vaginal bleeding or vaginal discharge.

## 2019-11-22 NOTE — MAU Note (Signed)
IV attempt x 2- IV team order placed per Dr.Marsala

## 2019-11-22 NOTE — MAU Note (Signed)
Attempt to obtain fetal heart tones-maternal HR detected/placental -Dr.Bass notified

## 2019-11-22 NOTE — H&P (Signed)
FACULTY PRACTICE ANTEPARTUM ADMISSION HISTORY AND PHYSICAL NOTE   History of Present Illness: Jacqueline Shepherd is a 29 y.o. G2P0010 at 37w2dadmitted for hyperemesis.  The patient noted her nausea started at 11 AM and she has had multiple episodes of emesis since then.  [facility]  The patient was previously admitted from 9/18 to 9/21 for similar symptoms.  In the past the pt has admitted to using marijuana/THC.  She has been advised the THC use may contribute to her nausea.  At the time of previous discharge, the pt had stopped using marijuana.  Today, the pt noted she still smokes marijuana daily.  She denies fever/chills. The patient's gestation is too early to detect fetal movement. Patient reports uterine contraction  activity as none. Patient reports  vaginal bleeding as none. Patient describes fluid per vagina as None. Fetal presentation is unsure.  Patient Active Problem List   Diagnosis Date Noted  . Hyperemesis gravidarum, antepartum 11/22/2019  . [redacted] weeks gestation of pregnancy 11/11/2019  . Tobacco use in pregnancy, antepartum, first trimester 11/11/2019  . Other social stressor 11/11/2019  . 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 Obesity in pregnancy 10/12/2019  . BMI 40.0-44.9, [place] ([place] 10/12/2019  . Weight loss 10/12/2019  . Transient hypertension of pregnancy 10/12/2019  . Cannabinoid hyperemesis syndrome   . Supervision of high risk pregnancy, antepartum 10/06/2019  . Rubella non-immune status, antepartum 08/18/2018    Past Medical History:  Diagnosis Date  . Abdominal pain   . Cannabinoid hyperemesis syndrome   . Chlamydia infection complicating pregnancy in second trimester 08/18/2018   Negative test of cure on 08/06/18    Past Surgical History:  Procedure Laterality Date  . WISDOM TOOTH EXTRACTION      OB History    Gravida Para Term Preterm AB Living  2       1    SAB TAB Ectopic Multiple Live Births  1            # Outcome Date GA Lbr Len/2nd Weight Sex Delivery Anes PTL Lv  2 Current           1 SAB             Social History   Socioeconomic History  . Marital status: Single    Spouse name: Not on file  . Number of children: Not on file  . Years of education: Not on file  . Highest education level: Not on file  Occupational History  . Not on file  Tobacco Use  . Smoking status: Current Some Day Smoker    Packs/day: 0.50  . Smokeless tobacco: Never Used  Vaping Use  . Vaping Use: Never used  Substance and Sexual Activity  . Alcohol use: No  . Drug use: Not on file    Comment: 1/day last used yesterday 11/22/2019  . Sexual activity: Yes    Partners: Male    Birth control/protection: None    Comment: Pregnant   Other Topics Concern  . Not on file  Social History Narrative  . Not on file   Social Determinants of Health   Financial Resource Strain:   . Difficulty of Paying Living Expenses: Not on file  Food Insecurity:   . Worried About RCharity fundraiserin the Last Year: Not on file  . Ran Out of Food in the  Last Year: Not on file  Transportation Needs:   . Lack of Transportation (Medical): Not on file  . Lack of Transportation (Non-Medical): Not on file  Physical Activity:   . Days of Exercise per Week: Not on file  . Minutes of Exercise per Session: Not on file  Stress:   . Feeling of Stress : Not on file  Social Connections:   . Frequency of Communication with Friends and Family: Not on file  . Frequency of Social Gatherings with Friends and Family: Not on file  . Attends Religious Services: Not on file  . Active Member of Clubs or Organizations: Not on file  . Attends Archivist Meetings: Not on file  . Marital Status: Not on file    Family History  Problem Relation Age of Onset  . Diabetes Mother   . Hypertension Mother     No Known  Allergies  Medications Prior to Admission  Medication Sig Dispense Refill Last Dose  . promethazine (PHENERGAN) 25 MG tablet Take 1 tablet (25 mg total) by mouth every 6 (six) hours as needed for nausea or vomiting. 30 tablet 2 11/22/2019 at 1800  . aspirin EC 81 MG tablet Take 1 tablet (81 mg total) by mouth daily. Swallow whole. 90 tablet 3   . Blood Pressure Monitor KIT 1 Device by Does not apply route once a week. To be monitored Regularly at home. 1 kit 0   . famotidine (PEPCID) 20 MG tablet Take 1 tablet (20 mg total) by mouth daily. 30 tablet 3   . Melatonin 3 MG CAPS Take 1 capsule (3 mg total) by mouth at bedtime. 30 capsule 3   . Prenatal Vit-Fe Fumarate-FA (PRENATAL PO) Take 1 tablet by mouth daily.     . Prenatal Vit-Fe Fumarate-FA (PREPLUS) 27-1 MG TABS Take 1 tablet by mouth daily. 30 tablet 13   . pyridOXINE (VITAMIN B-6) 25 MG tablet Take 1 tablet (25 mg total) by mouth every 8 (eight) hours. 90 tablet 2     Review of Systems - Review of Systems  Constitutional: Positive for malaise/fatigue. Negative for chills and fever.  HENT: Negative.   Eyes: Negative.   Respiratory: Negative.   Cardiovascular: Negative.   Gastrointestinal: Positive for abdominal pain, nausea and vomiting. Negative for diarrhea.  Genitourinary: Negative.   Musculoskeletal: Negative.   Skin: Negative.   Neurological: Negative.   Psychiatric/Behavioral: Negative.      Vitals:  BP (!) 142/74 (BP Location: Right Arm)   Pulse (!) 55   Temp 98.4 F (36.9 C) (Oral)   Resp 19   Ht _0  (1.702 m)   Wt 124.7 kg   LMP 09/17/2019   SpO2 100%   BMI 43.06 kg/m  Physical Examination: CONSTITUTIONAL: Well-developed, obese female in no acute distress.  HENT:  Normocephalic, atraumatic, External right and left ear normal. Oropharynx is clear and moist EYES: Conjunctivae and EOM are normal. NECK: Normal range of motion, supple, no masses SKIN: Skin is warm and dry. No rash noted. Not diaphoretic. No  erythema. No pallor. [place]: Alert and oriented to person, place, and time. Normal reflexes, muscle tone coordination. No cranial nerve deficit noted. PSYCHIATRIC: Normal mood and affect. Normal behavior. Normal judgment and thought content. CARDIOVASCULAR: Normal heart rate noted, regular rhythm RESPIRATORY: Effort and breath sounds normal, no problems with respiration noted ABDOMEN: Soft, diffuse mild tenderness, nondistended, gravid. MUSCULOSKELETAL: Normal range of motion. No edema and no tenderness. 2+ distal pulses.  Membranes:intact  Labs:  Results for orders placed or performed during the hospital encounter of 11/21/19 (from the past 24 hour(s))  Comprehensive metabolic panel   Collection Time: 11/21/19  5:01 PM  Result Value Ref Range   Sodium 134 (L) 135 - 145 mmol/L   Potassium 3.7 3.5 - 5.1 mmol/L   Chloride 104 98 - 111 mmol/L   CO2 20 (L) 22 - 32 mmol/L   Glucose, Bld 149 (H) 70 - 99 mg/dL   BUN 7 6 - 20 mg/dL   Creatinine, Ser 0.57 0.44 - 1.00 mg/dL   Calcium 8.9 8.9 - 10.3 mg/dL   Total Protein 8.1 6.5 - 8.1 g/dL   Albumin 3.9 3.5 - 5.0 g/dL   AST 16 15 - 41 U/L   ALT 12 0 - 44 U/L   Alkaline Phosphatase 43 38 - 126 U/L   Total Bilirubin 0.6 0.3 - 1.2 mg/dL   GFR, Estimated >60 >60 mL/min   Anion gap 10 5 - 15  CBC with Differential   Collection Time: 11/21/19  5:01 PM  Result Value Ref Range   WBC 11.4 (H) 4.0 - 10.5 K/uL   RBC 4.60 3.87 - 5.11 MIL/uL   Hemoglobin 12.4 12.0 - 15.0 g/dL   HCT 38.5 36 - 46 %   MCV 83.7 80.0 - 100.0 fL   MCH 27.0 26.0 - 34.0 pg   MCHC 32.2 30.0 - 36.0 g/dL   RDW 13.7 11.5 - 15.5 %   Platelets 237 150 - 400 K/uL   nRBC 0.0 0.0 - 0.2 %   Neutrophils Relative % 81 %   Neutro Abs 9.1 (H) 1.7 - 7.7 K/uL   Lymphocytes Relative 14 %   Lymphs Abs 1.6 0.7 - 4.0 K/uL   Monocytes Relative 5 %   Monocytes Absolute 0.5 0.1 - 1.0 K/uL   Eosinophils Relative 0 %   Eosinophils Absolute 0.0 0.0 - 0.5 K/uL   Basophils Relative 0 %    Basophils Absolute 0.0 0.0 - 0.1 K/uL   WBC Morphology VACUOLATED NEUTROPHILS    Immature Granulocytes 0 %   Abs Immature Granulocytes 0.04 0.00 - 0.07 K/uL   Burr Cells PRESENT   Magnesium   Collection Time: 11/21/19  5:01 PM  Result Value Ref Range   Magnesium 1.9 1.7 - 2.4 mg/dL  Urinalysis, Routine w reflex microscopic Urine, Clean Catch   Collection Time: 11/21/19  7:24 PM  Result Value Ref Range   Color, Urine YELLOW YELLOW   APPearance TURBID (A) CLEAR   Specific Gravity, Urine 1.017 1.005 - 1.030   pH 9.0 (H) 5.0 - 8.0   Glucose, UA 150 (A) NEGATIVE mg/dL   Hgb urine dipstick NEGATIVE NEGATIVE   Bilirubin Urine NEGATIVE NEGATIVE   Ketones, ur 5 (A) NEGATIVE mg/dL   Protein, ur NEGATIVE NEGATIVE mg/dL   Nitrite NEGATIVE NEGATIVE   Leukocytes,Ua NEGATIVE NEGATIVE   RBC / HPF 0-5 0 - 5 RBC/hpf   WBC, UA 0-5 0 - 5 WBC/hpf   Bacteria, UA RARE (A) NONE SEEN   Squamous Epithelial / LPF 0-5 0 - 5   Amorphous Crystal PRESENT     Imaging Studies: No results found.   Assessment and Plan: Patient Active Problem List   Diagnosis Date Noted  . Hyperemesis gravidarum, antepartum 11/22/2019  . [redacted] weeks gestation of pregnancy 11/11/2019  . Tobacco use in pregnancy, antepartum, first trimester 11/11/2019  . Other social stressor 11/11/2019  . 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 Obesity in  pregnancy 10/12/2019  . BMI 40.0-44.9, adult ([place]) 10/12/2019  . Weight loss 10/12/2019  . Transient hypertension of pregnancy 10/12/2019  . Cannabinoid hyperemesis syndrome   . Supervision of high risk pregnancy, antepartum 10/06/2019  . Rubella non-immune status, antepartum 08/18/2018   Admit to Antenatal IV hydration IV antiemetics, antacid Bowel rest for the evening UDS Pt again advised to cease marijuana use as it seems to precipitate her nausea.   Lynnda Shields, MD Faculty Attending, [facility]

## 2019-11-23 NOTE — Discharge Summary (Signed)
Antenatal Physician Discharge Summary  Patient ID: Jacqueline Shepherd MRN: [id number] DOB/AGE: 08-17-1990 29 y.o.  Admit date: 11/21/2019 Discharge date: 11/21/29  Admission Diagnoses: HEG, Marijuana use Discharge Diagnoses:  same Prenatal Procedures: none  Consults: Neonatology, Maternal Fetal Medicine  Hospital Course:  Ricarda VERNICA WACHTEL is a 29 y.o. G2P0010 with IUP at [redacted]w[redacted]d admitted for HEG.  She was admitted with nausea and vomiting and had not tolerated PO in 3-4 days. On HOD1, she was able to tolerate PO and her nausea had improved. No leaking of fluid and no bleeding.  She was observed, fetal heart rate monitoring remained reassuring.  She was deemed stable for discharge to home with outpatient follow up. Pt adamant regarding THC cessation.   Discharge Exam: Pulse Rate:  [58-64] 64 (10/30 0655) BP: (141-145)/(72-73) 141/72 (10/30 0120) Physical Examination: CONSTITUTIONAL: Well-developed, well-nourished female in no acute distress.  HENT:  Normocephalic, atraumatic, External right and left ear normal. Oropharynx is clear and moist EYES: Conjunctivae and EOM are normal. Pupils are equal, round, and reactive to light. No scleral icterus.  NECK: Normal range of motion, supple, no masses SKIN: Skin is warm and dry. No rash noted. Not diaphoretic. No erythema. No pallor. NEUROLGIC: Alert and oriented to person, place, and time. Normal reflexes, muscle tone coordination. No cranial nerve deficit noted. PSYCHIATRIC: Normal mood and affect. Normal behavior. Normal judgment and thought content. CARDIOVASCULAR: Normal heart rate noted, regular rhythm RESPIRATORY: Effort and breath sounds normal, no problems with respiration noted MUSCULOSKELETAL: Normal range of motion. No edema and no tenderness. 2+ distal pulses. ABDOMEN: Soft, nontender, nondistended, gravid.  Fetal monitoring: FHR: 150 bpm  Significant Diagnostic Studies:  Results for orders placed or performed during the  hospital encounter of 11/21/19 (from the past 168 hour(s))  Comprehensive metabolic panel   Collection Time: 11/21/19  5:01 PM  Result Value Ref Range   Sodium 134 (L) 135 - 145 mmol/L   Potassium 3.7 3.5 - 5.1 mmol/L   Chloride 104 98 - 111 mmol/L   CO2 20 (L) 22 - 32 mmol/L   Glucose, Bld 149 (H) 70 - 99 mg/dL   BUN 7 6 - 20 mg/dL   Creatinine, Ser 3.58 0.44 - 1.00 mg/dL   Calcium 8.9 8.9 - 13.6 mg/dL   Total Protein 8.1 6.5 - 8.1 g/dL   Albumin 3.9 3.5 - 5.0 g/dL   AST 16 15 - 41 U/L   ALT 12 0 - 44 U/L   Alkaline Phosphatase 43 38 - 126 U/L   Total Bilirubin 0.6 0.3 - 1.2 mg/dL   GFR, Estimated >25 >22 mL/min   Anion gap 10 5 - 15  CBC with Differential   Collection Time: 11/21/19  5:01 PM  Result Value Ref Range   WBC 11.4 (H) 4.0 - 10.5 K/uL   RBC 4.60 3.87 - 5.11 MIL/uL   Hemoglobin 12.4 12.0 - 15.0 g/dL   HCT 61.6 36 - 46 %   MCV 83.7 80.0 - 100.0 fL   MCH 27.0 26.0 - 34.0 pg   MCHC 32.2 30.0 - 36.0 g/dL   RDW 74.6 28.2 - 86.6 %   Platelets 237 150 - 400 K/uL   nRBC 0.0 0.0 - 0.2 %   Neutrophils Relative % 81 %   Neutro Abs 9.1 (H) 1.7 - 7.7 K/uL   Lymphocytes Relative 14 %   Lymphs Abs 1.6 0.7 - 4.0 K/uL   Monocytes Relative 5 %   Monocytes Absolute 0.5 0.1 - 1.0  K/uL   Eosinophils Relative 0 %   Eosinophils Absolute 0.0 0.0 - 0.5 K/uL   Basophils Relative 0 %   Basophils Absolute 0.0 0.0 - 0.1 K/uL   WBC Morphology VACUOLATED NEUTROPHILS    Immature Granulocytes 0 %   Abs Immature Granulocytes 0.04 0.00 - 0.07 K/uL   Burr Cells PRESENT   Magnesium   Collection Time: 11/21/19  5:01 PM  Result Value Ref Range   Magnesium 1.9 1.7 - 2.4 mg/dL  Urinalysis, Routine w reflex microscopic Urine, Clean Catch   Collection Time: 11/21/19  7:24 PM  Result Value Ref Range   Color, Urine YELLOW YELLOW   APPearance TURBID (A) CLEAR   Specific Gravity, Urine 1.017 1.005 - 1.030   pH 9.0 (H) 5.0 - 8.0   Glucose, UA 150 (A) NEGATIVE mg/dL   Hgb urine dipstick  NEGATIVE NEGATIVE   Bilirubin Urine NEGATIVE NEGATIVE   Ketones, ur 5 (A) NEGATIVE mg/dL   Protein, ur NEGATIVE NEGATIVE mg/dL   Nitrite NEGATIVE NEGATIVE   Leukocytes,Ua NEGATIVE NEGATIVE   RBC / HPF 0-5 0 - 5 RBC/hpf   WBC, UA 0-5 0 - 5 WBC/hpf   Bacteria, UA RARE (A) NONE SEEN   Squamous Epithelial / LPF 0-5 0 - 5   Amorphous Crystal PRESENT   Rapid urine drug screen (hospital performed)   Collection Time: 11/21/19  7:32 PM  Result Value Ref Range   Opiates NONE DETECTED NONE DETECTED   Cocaine NONE DETECTED NONE DETECTED   Benzodiazepines NONE DETECTED NONE DETECTED   Amphetamines NONE DETECTED NONE DETECTED   Tetrahydrocannabinol POSITIVE (A) NONE DETECTED   Barbiturates NONE DETECTED NONE DETECTED   No results found.  Future Appointments  Date Time Provider [place]  12/09/2019  9:00 AM Cephas Darby, MD [place] None  01/08/2020 10:45 AM [facility] US5 [facility]    Discharge Condition: Stable  Discharge disposition: 01-Home or Self Care       Discharge Instructions    Discharge patient   Complete by: As directed    Discharge disposition: 01-Home or Self Care   Discharge patient date: 11/22/2019     Allergies as of 11/22/2019   No Known Allergies     Medication List    TAKE these medications   aspirin EC 81 MG tablet Take 1 tablet (81 mg total) by mouth daily. Swallow whole.   Blood Pressure Monitor Kit 1 Device by Does not apply route once a week. To be monitored Regularly at home.   famotidine 20 MG tablet Commonly known as: PEPCID Take 1 tablet (20 mg total) by mouth daily.   Melatonin 3 MG Caps Take 1 capsule (3 mg total) by mouth at bedtime.   metoCLOPramide 10 MG tablet Commonly known as: REGLAN Take 1 tablet (10 mg total) by mouth every 6 (six) hours.   ondansetron 4 MG disintegrating tablet Commonly known as: Zofran ODT Take 1 tablet (4 mg total) by mouth every 8 (eight) hours as needed for nausea or vomiting.    PRENATAL PO Take 1 tablet by mouth daily.   PrePLUS 27-1 MG Tabs Take 1 tablet by mouth daily.   promethazine 25 MG tablet Commonly known as: PHENERGAN Take 1 tablet (25 mg total) by mouth every 6 (six) hours as needed for nausea or vomiting.   pyridOXINE 25 MG tablet Commonly known as: VITAMIN B-6 Take 1 tablet (25 mg total) by mouth every 8 (eight) hours.   scopolamine 1 MG/3DAYS Commonly known as:  Transderm-Scop (1.5 MG) Place 1 patch (1.5 mg total) onto the skin every 3 (three) days.       Follow-up Information    [place] DEPT.   Specialty: Emergency Medicine Why: return to the ER for new or worsening symptoms Contact information: [place] [id number] [place].   Specialty: Obstetrics and Gynecology Why: As needed Contact information: [address] [place] [phone]              Total discharge time: 15 minutes   Signed: Cherre Blanc M.D. 11/23/2019, 2:38 AM

## 2019-11-24 ENCOUNTER — Encounter: Payer: Self-pay | Admitting: Obstetrics and Gynecology

## 2019-11-24 DIAGNOSIS — D563 Thalassemia minor: Secondary | ICD-10-CM | POA: Insufficient documentation

## 2019-12-01 ENCOUNTER — Encounter: Payer: Medicaid Other | Admitting: Advanced Practice Midwife

## 2019-12-09 ENCOUNTER — Other Ambulatory Visit: Payer: Self-pay

## 2019-12-09 ENCOUNTER — Ambulatory Visit (INDEPENDENT_AMBULATORY_CARE_PROVIDER_SITE_OTHER): Payer: Medicaid Other | Admitting: Obstetrics and Gynecology

## 2019-12-09 ENCOUNTER — Encounter: Payer: Self-pay | Admitting: Obstetrics and Gynecology

## 2019-12-09 ENCOUNTER — Encounter: Payer: Medicaid Other | Admitting: Obstetrics and Gynecology

## 2019-12-09 ENCOUNTER — Other Ambulatory Visit (HOSPITAL_COMMUNITY)
Admission: RE | Admit: 2019-12-09 | Discharge: 2019-12-09 | Disposition: A | Discharge status: Home / Self Care | Payer: Medicaid Other | Source: Ambulatory Visit | Attending: Obstetrics and Gynecology | Admitting: Obstetrics and Gynecology

## 2019-12-09 VITALS — BP 104/64 | HR 71 | Wt 284.0 lb

## 2019-12-09 DIAGNOSIS — D563 Thalassemia minor: Secondary | ICD-10-CM

## 2019-12-09 DIAGNOSIS — O0990 Supervision of high risk pregnancy, unspecified, unspecified trimester: Secondary | ICD-10-CM | POA: Insufficient documentation

## 2019-12-09 DIAGNOSIS — R7989 Other specified abnormal findings of blood chemistry: Secondary | ICD-10-CM

## 2019-12-09 NOTE — Progress Notes (Signed)
   PRENATAL VISIT NOTE  Subjective:  Jacqueline Shepherd is a 29 y.o. G2P0010 at [redacted]w[redacted]d being seen today for ongoing prenatal care.  [name] currently monitored for the following issues for this high-risk pregnancy and has Rubella non-immune status, antepartum; Supervision of high risk pregnancy, antepartum; Cannabinoid hyperemesis syndrome; Obesity in pregnancy; BMI 40.0-44.9, adult (HCC); Weight loss; Lewis isoimmunization during pregnancy; [redacted] weeks gestation of pregnancy; Tobacco use in pregnancy, antepartum, first trimester; Other social stressor; Hyperemesis gravidarum, antepartum; Alpha thalassemia silent carrier; and Abnormal TSH on their problem list.  Patient reports no complaints.   .  .  Movement: Present. Denies leaking of fluid.   The following portions of the patient's history were reviewed and updated as appropriate: allergies, current medications, past family history, past medical history, past social history, past surgical history and problem list.   Objective:   Vitals:   12/09/19 0913  BP: 104/64  Pulse: 71  Weight: 284 lb (128.8 kg)    Fetal Status:     Movement: Present     General:  Alert, oriented and cooperative. Patient is in no acute distress.  Skin: Skin is warm and dry. No rash noted.   Cardiovascular: Normal heart rate noted  Respiratory: Normal respiratory effort, no problems with respiration noted  Abdomen: Soft, gravid, appropriate for gestational age.  Pain/Pressure: Present     Pelvic: Cervical exam deferred        Extremities: Normal range of motion.  Edema: Trace  Mental Status: Normal mood and affect. Normal behavior. Normal judgment and thought content.   Assessment and Plan:  Pregnancy: G2P0010 at [redacted]w[redacted]d 1. Alpha thalassemia silent carrier - FOB to be tested.  2. Supervision of high risk pregnancy, antepartum  - Cervicovaginal ancillary only( [place])  3. Abnormal TSH  - T4, free - T3, free  Preterm labor symptoms and general obstetric  precautions including but not limited to vaginal bleeding, contractions, leaking of fluid and fetal movement were reviewed in detail with the patient. Please refer to After Visit Summary for other counseling recommendations.   Return in about 4 weeks (around 01/06/2020) for HROB.  Future Appointments  Date Time Provider Department Center  01/08/2020 10:45 AM [facility] US[facility]    Jacqueline Bridge, MD

## 2019-12-10 LAB — CERVICOVAGINAL ANCILLARY ONLY
Bacterial Vaginitis (gardnerella): POSITIVE — AB
Candida Glabrata: NEGATIVE
Candida Vaginitis: NEGATIVE
Chlamydia: POSITIVE — AB
Comment: NEGATIVE
Comment: NEGATIVE
Comment: NEGATIVE
Comment: NEGATIVE
Comment: NEGATIVE
Comment: NORMAL
Neisseria Gonorrhea: NEGATIVE
Trichomonas: NEGATIVE

## 2019-12-10 LAB — T4, FREE: Free T4: 1.31 ng/dL (ref 0.82–1.77)

## 2019-12-10 LAB — T3, FREE: T3, Free: 4.2 pg/mL (ref 2.0–4.4)

## 2019-12-11 ENCOUNTER — Other Ambulatory Visit: Payer: Self-pay | Admitting: Obstetrics & Gynecology

## 2019-12-11 ENCOUNTER — Encounter: Payer: Self-pay | Admitting: Obstetrics & Gynecology

## 2019-12-11 ENCOUNTER — Other Ambulatory Visit: Payer: Self-pay

## 2019-12-11 DIAGNOSIS — O98812 Other maternal infectious and parasitic diseases complicating pregnancy, second trimester: Secondary | ICD-10-CM

## 2019-12-11 DIAGNOSIS — O23592 Infection of other part of genital tract in pregnancy, second trimester: Secondary | ICD-10-CM

## 2019-12-11 DIAGNOSIS — A749 Chlamydial infection, unspecified: Secondary | ICD-10-CM

## 2019-12-11 MED ORDER — AZITHROMYCIN 500 MG PO TABS: 1000.0000 mg | ORAL_TABLET | Freq: Once | ORAL | 1 refills | Status: AC

## 2019-12-11 MED ORDER — METRONIDAZOLE 500 MG PO TABS: 500.0000 mg | ORAL_TABLET | Freq: Two times a day (BID) | ORAL | 0 refills | Status: AC

## 2019-12-11 NOTE — Progress Notes (Signed)
Patient has chlamydia again, had it during her last pregnancy.  Had negative testing for other STIs,  but she needs to let partner(s) know so the partner(s) can get testing and treatment. Patient and sex partner(s) should abstain from unprotected sexual activity for at least seven days after everyone receives appropriate treatment.  Azithromycin was prescribed for patient.  Also prescribed Metronidazole for bacterial vaginitis which was also noted.  Patient will need to return in about 4 weeks after treatment for repeat test of cure.  Please call to inform patient of results and recommendations, and advise to pick up prescriptions and take as directed.  Please advise patient to practice safe sex at all times.  Jaynie Collins, MD

## 2019-12-25 ENCOUNTER — Encounter (HOSPITAL_COMMUNITY): Payer: Self-pay | Admitting: Family Medicine

## 2019-12-25 ENCOUNTER — Inpatient Hospital Stay (HOSPITAL_COMMUNITY)
Admission: AD | Admit: 2019-12-25 | Discharge: 2019-12-26 | Disposition: A | Discharge status: Home / Self Care | Payer: Medicaid Other | Attending: Family Medicine | Admitting: Family Medicine

## 2019-12-25 DIAGNOSIS — O99283 Endocrine, nutritional and metabolic diseases complicating pregnancy, third trimester: Secondary | ICD-10-CM

## 2019-12-25 DIAGNOSIS — O99332 Smoking (tobacco) complicating pregnancy, second trimester: Secondary | ICD-10-CM | POA: Insufficient documentation

## 2019-12-25 DIAGNOSIS — F12288 Cannabis dependence with other cannabis-induced disorder: Secondary | ICD-10-CM | POA: Diagnosis not present

## 2019-12-25 DIAGNOSIS — Z3A17 17 weeks gestation of pregnancy: Secondary | ICD-10-CM

## 2019-12-25 DIAGNOSIS — Z79899 Other long term (current) drug therapy: Secondary | ICD-10-CM | POA: Diagnosis not present

## 2019-12-25 DIAGNOSIS — E876 Hypokalemia: Secondary | ICD-10-CM

## 2019-12-25 DIAGNOSIS — O99322 Drug use complicating pregnancy, second trimester: Secondary | ICD-10-CM | POA: Diagnosis not present

## 2019-12-25 DIAGNOSIS — O26892 Other specified pregnancy related conditions, second trimester: Secondary | ICD-10-CM | POA: Insufficient documentation

## 2019-12-25 DIAGNOSIS — O211 Hyperemesis gravidarum with metabolic disturbance: Secondary | ICD-10-CM | POA: Diagnosis not present

## 2019-12-25 DIAGNOSIS — E860 Dehydration: Secondary | ICD-10-CM | POA: Diagnosis not present

## 2019-12-25 DIAGNOSIS — F17210 Nicotine dependence, cigarettes, uncomplicated: Secondary | ICD-10-CM | POA: Diagnosis not present

## 2019-12-25 DIAGNOSIS — O99282 Endocrine, nutritional and metabolic diseases complicating pregnancy, second trimester: Secondary | ICD-10-CM | POA: Insufficient documentation

## 2019-12-25 LAB — URINALYSIS, ROUTINE W REFLEX MICROSCOPIC
Bilirubin Urine: NEGATIVE
Glucose, UA: 50 mg/dL — AB
Hgb urine dipstick: NEGATIVE
Ketones, ur: 80 mg/dL — AB
Leukocytes,Ua: NEGATIVE
Nitrite: NEGATIVE
Protein, ur: NEGATIVE mg/dL
Specific Gravity, Urine: 1.018 (ref 1.005–1.030)
pH: 7 (ref 5.0–8.0)

## 2019-12-25 MED ORDER — LACTATED RINGERS IV SOLN: Freq: Once | INTRAVENOUS | Status: AC

## 2019-12-25 MED ORDER — SCOPOLAMINE 1 MG/3DAYS TD PT72
1.0000 | MEDICATED_PATCH | TRANSDERMAL | Status: DC
  Administered 2019-12-25: 1.5 mg via TRANSDERMAL
  Filled 2019-12-25: qty 1

## 2019-12-25 MED ORDER — PROMETHAZINE HCL 25 MG/ML IJ SOLN
12.5000 mg | Freq: Once | INTRAMUSCULAR | Status: AC
  Administered 2019-12-25: 12.5 mg via INTRAVENOUS
  Filled 2019-12-25: qty 1

## 2019-12-25 MED ORDER — DEXAMETHASONE SODIUM PHOSPHATE 10 MG/ML IJ SOLN
10.0000 mg | Freq: Once | INTRAMUSCULAR | Status: AC
  Administered 2019-12-25: 10 mg via INTRAVENOUS
  Filled 2019-12-25: qty 1

## 2019-12-25 NOTE — MAU Provider Note (Signed)
Chief Complaint:  No chief complaint on file.   First Provider Initiated Contact with Patient 12/25/19 2237     HPI  HPI: Jacqueline Shepherd is a 29 y.o. G2P0010 at 61w0dwho presents to maternity admissions reporting vomiting and abdominal pain (epigastric).  States ran out of every medication she was prescribed.  States she took them today, then states "no, I took them a month ago".  WIll not give a straight answer as to whether or not she had ever taken any home meds. States smokes MJ "because its fun".  States did not know it might not be good for the baby until now. Has had multiple ED visits for this prior to pregnancy and has been hospitalized recently for hyperemesis and weight loss. . She denies LOF, vaginal bleeding, dizziness, n/v, diarrhea, constipation or fever/chills.    Weight variations over time: 08/21/19    129.3kg   Pre-Pregnancy 10/12/19    120.2kg 11/11/19  124.7kg 12/09/19  128.8kg 12/25/19    124.7kg  RN Note: Per EMS pt reports stabbing abd pain that started at 1300 today, vomiting. Pt reports she has had the same symptoms in the past and was diagnosed with reflux.   Past Medical History: Past Medical History:  Diagnosis Date  . Abdominal pain   . Cannabinoid hyperemesis syndrome   . Chlamydia infection complicating pregnancy in second trimester 08/18/2018   Negative test of cure on 08/06/18  . Transient hypertension of pregnancy 10/12/2019    Past obstetric history: OB History  Gravida Para Term Preterm AB Living  2       1    SAB TAB Ectopic Multiple Live Births  1            # Outcome Date GA Lbr Len/2nd Weight Sex Delivery Anes PTL Lv  2 Current           1 SAB             Past Surgical History: Past Surgical History:  Procedure Laterality Date  . WISDOM TOOTH EXTRACTION      Family History: Family History  Problem Relation Age of Onset  . Diabetes Mother   . Hypertension Mother     Social History: Social History   Tobacco Use  . Smoking  status: Current Some Day Smoker    Packs/day: 0.50  . Smokeless tobacco: Never Used  Vaping Use  . Vaping Use: Never used  Substance Use Topics  . Alcohol use: No  . Drug use: Not on file    Comment: yesterday 12/24/2019    Allergies: No Known Allergies  Meds:  Medications Prior to Admission  Medication Sig Dispense Refill Last Dose  . Blood Pressure Monitor KIT 1 Device by Does not apply route once a week. To be monitored Regularly at home. (Patient not taking: Reported on 12/09/2019) 1 kit 0   . ondansetron (ZOFRAN ODT) 4 MG disintegrating tablet Take 1 tablet (4 mg total) by mouth every 8 (eight) hours as needed for nausea or vomiting. (Patient not taking: Reported on 12/09/2019) 20 tablet 0   . Prenatal Vit-Fe Fumarate-FA (PRENATAL PO) Take 1 tablet by mouth daily.     . Prenatal Vit-Fe Fumarate-FA (PREPLUS) 27-1 MG TABS Take 1 tablet by mouth daily. 30 tablet 13     I have reviewed patient's Past Medical Hx, Surgical Hx, Family Hx, Social Hx, medications and allergies.   ROS:  Review of Systems  Constitutional: Positive for fatigue. Negative for chills and fever.  Respiratory: Negative for shortness of breath.   Gastrointestinal: Positive for abdominal pain (epigastric), nausea and vomiting. Negative for constipation and diarrhea.  Genitourinary: Negative for pelvic pain and vaginal bleeding.   Other systems negative  Physical Exam   Patient Vitals for the past 24 hrs:  BP Temp Temp src Pulse Resp Height Weight  12/25/19 2207 (!) 142/84 (!) 97.2 F (36.2 C) Oral 74 17 $Rem'5\' 6"'cdUr$  (1.676 m) 124.7 kg   Vitals:   12/25/19 2207 12/25/19 2248 12/26/19 0301 12/26/19 0409  BP: (!) 142/84 (!) 123/56 (!) 118/49 (!) 144/77  Pulse: 74 (!) 57 (!) 58 66  Resp: 17     Temp: (!) 97.2 F (36.2 C)  98.2 F (36.8 C)   TempSrc: Oral  Axillary   Weight: 124.7 kg     Height: $Remove'5\' 6"'eQmevUD$  (1.676 m)       Constitutional: Well-developed female in no acute distress. Difficult exam due to patient  lying on her stomach, declining to turn over.  Cardiovascular: normal rate and rhythm Respiratory: normal effort, clear to auscultation bilaterally GI: Abd soft, non-tender, gravid appropriate for gestational age.   No rebound or guarding. MS: Extremities nontender, no edema, normal ROM Neurologic: Alert and oriented x 4.  GU: Neg CVAT.  PELVIC EXAM: deferred FHR 134   Labs: Results for orders placed or performed during the hospital encounter of 12/25/19 (from the past 24 hour(s))  Urinalysis, Routine w reflex microscopic     Status: Abnormal   Collection Time: 12/25/19 10:37 PM  Result Value Ref Range   Color, Urine YELLOW YELLOW   APPearance TURBID (A) CLEAR   Specific Gravity, Urine 1.018 1.005 - 1.030   pH 7.0 5.0 - 8.0   Glucose, UA 50 (A) NEGATIVE mg/dL   Hgb urine dipstick NEGATIVE NEGATIVE   Bilirubin Urine NEGATIVE NEGATIVE   Ketones, ur 80 (A) NEGATIVE mg/dL   Protein, ur NEGATIVE NEGATIVE mg/dL   Nitrite NEGATIVE NEGATIVE   Leukocytes,Ua NEGATIVE NEGATIVE   Bacteria, UA RARE (A) NONE SEEN   Squamous Epithelial / LPF 21-50 0 - 5   Mucus PRESENT   CBC     Status: Abnormal   Collection Time: 12/25/19 10:37 PM  Result Value Ref Range   WBC 12.7 (H) 4.0 - 10.5 K/uL   RBC 4.39 3.87 - 5.11 MIL/uL   Hemoglobin 11.9 (L) 12.0 - 15.0 g/dL   HCT 36.1 36 - 46 %   MCV 82.2 80.0 - 100.0 fL   MCH 27.1 26.0 - 34.0 pg   MCHC 33.0 30.0 - 36.0 g/dL   RDW 13.7 11.5 - 15.5 %   Platelets 207 150 - 400 K/uL   nRBC 0.0 0.0 - 0.2 %  Comprehensive metabolic panel     Status: Abnormal   Collection Time: 12/25/19 10:37 PM  Result Value Ref Range   Sodium 131 (L) 135 - 145 mmol/L   Potassium 3.2 (L) 3.5 - 5.1 mmol/L   Chloride 103 98 - 111 mmol/L   CO2 18 (L) 22 - 32 mmol/L   Glucose, Bld 140 (H) 70 - 99 mg/dL   BUN 6 6 - 20 mg/dL   Creatinine, Ser 0.56 0.44 - 1.00 mg/dL   Calcium 8.6 (L) 8.9 - 10.3 mg/dL   Total Protein 6.8 6.5 - 8.1 g/dL   Albumin 3.3 (L) 3.5 - 5.0 g/dL   AST  16 15 - 41 U/L   ALT 17 0 - 44 U/L   Alkaline Phosphatase 44 38 -  126 U/L   Total Bilirubin 0.5 0.3 - 1.2 mg/dL   GFR, Estimated >60 >60 mL/min   Anion gap 10 5 - 15  Amylase     Status: None   Collection Time: 12/25/19 10:37 PM  Result Value Ref Range   Amylase 43 28 - 100 U/L  Lipase, blood     Status: None   Collection Time: 12/25/19 10:37 PM  Result Value Ref Range   Lipase 19 11 - 51 U/L    B/Positive/-- (10/19 1355)  Imaging:  No results found.  MAU Course/MDM: I have ordered labs and reviewed results. Labs ordered since they had not been checked recently  We did note slight hypokalemia, which we supplemented with one dose of KCL.  Treatments in MAU included IV hydration, Phenergan, Scop Patch,  Decadron, Zofran and GI cocktail. Still vomited after that so we gave a dose of Haldol.  Patient improved and was able to keep down fluids One 63mEq PO dose of KCl given.   Assessment: [place] IUP at [redacted]w[redacted]d Cannaboid, hyperemesis Dehydration Mild hypokalemia  Plan: Discharge home Reordered all of her antiemetics and Protonix Encouraged to stop MJ Follow up in Office for prenatal visits   Encouraged to return if she develops worsening of symptoms, increase in pain, fever, or other concerning symptoms.   Pt stable at time of discharge.  Hansel Feinstein CNM, MSN Certified Nurse-Midwife 12/25/2019 10:37 PM

## 2019-12-25 NOTE — MAU Note (Signed)
Per EMS pt reports stabbing abd pain that started at 1300 today, vomiting. Pt reports she has had the same symptoms in the past and was diagnosed with reflux.

## 2019-12-26 ENCOUNTER — Other Ambulatory Visit: Payer: Self-pay | Admitting: Advanced Practice Midwife

## 2019-12-26 LAB — CBC
HCT: 36.1 % (ref 36.0–46.0)
Hemoglobin: 11.9 g/dL — ABNORMAL LOW (ref 12.0–15.0)
MCH: 27.1 pg (ref 26.0–34.0)
MCHC: 33 g/dL (ref 30.0–36.0)
MCV: 82.2 fL (ref 80.0–100.0)
Platelets: 207 10*3/uL (ref 150–400)
RBC: 4.39 MIL/uL (ref 3.87–5.11)
RDW: 13.7 % (ref 11.5–15.5)
WBC: 12.7 10*3/uL — ABNORMAL HIGH (ref 4.0–10.5)
nRBC: 0 % (ref 0.0–0.2)

## 2019-12-26 LAB — COMPREHENSIVE METABOLIC PANEL
ALT: 17 U/L (ref 0–44)
AST: 16 U/L (ref 15–41)
Albumin: 3.3 g/dL — ABNORMAL LOW (ref 3.5–5.0)
Alkaline Phosphatase: 44 U/L (ref 38–126)
Anion gap: 10 (ref 5–15)
BUN: 6 mg/dL (ref 6–20)
CO2: 18 mmol/L — ABNORMAL LOW (ref 22–32)
Calcium: 8.6 mg/dL — ABNORMAL LOW (ref 8.9–10.3)
Chloride: 103 mmol/L (ref 98–111)
Creatinine, Ser: 0.56 mg/dL (ref 0.44–1.00)
GFR, Estimated: >60 mL/min (ref >60)
Glucose, Bld: 140 mg/dL — ABNORMAL HIGH (ref 70–99)
Potassium: 3.2 mmol/L — ABNORMAL LOW (ref 3.5–5.1)
Sodium: 131 mmol/L — ABNORMAL LOW (ref 135–145)
Total Bilirubin: 0.5 mg/dL (ref 0.3–1.2)
Total Protein: 6.8 g/dL (ref 6.5–8.1)

## 2019-12-26 LAB — LIPASE, BLOOD: Lipase: 19 U/L (ref 11–51)

## 2019-12-26 LAB — AMYLASE: Amylase: 43 U/L (ref 28–100)

## 2019-12-26 MED ORDER — PROMETHAZINE HCL 25 MG PO TABS: 25.0000 mg | ORAL_TABLET | Freq: Four times a day (QID) | ORAL | 2 refills | Status: DC | PRN

## 2019-12-26 MED ORDER — LIDOCAINE VISCOUS HCL 2 % MT SOLN
15.0000 mL | Freq: Once | OROMUCOSAL | Status: AC
  Administered 2019-12-26: 15 mL via ORAL
  Filled 2019-12-26: qty 15

## 2019-12-26 MED ORDER — ALUM & MAG HYDROXIDE-SIMETH 200-200-20 MG/5ML PO SUSP
30.0000 mL | Freq: Once | ORAL | Status: AC
  Administered 2019-12-26: 30 mL via ORAL
  Filled 2019-12-26: qty 30

## 2019-12-26 MED ORDER — HALOPERIDOL LACTATE 5 MG/ML IJ SOLN
5.0000 mg | Freq: Once | INTRAMUSCULAR | Status: AC
  Administered 2019-12-26: 5 mg via INTRAVENOUS
  Filled 2019-12-26: qty 1

## 2019-12-26 MED ORDER — POTASSIUM CHLORIDE 20 MEQ PO PACK
20.0000 meq | PACK | Freq: Once | ORAL | Status: AC
  Administered 2019-12-26: 20 meq via ORAL
  Filled 2019-12-26: qty 1

## 2019-12-26 MED ORDER — SCOPOLAMINE 1 MG/3DAYS TD PT72: 1.0000 | MEDICATED_PATCH | TRANSDERMAL | 12 refills | Status: DC

## 2019-12-26 MED ORDER — ONDANSETRON HCL 4 MG/2ML IJ SOLN
4.0000 mg | Freq: Once | INTRAMUSCULAR | Status: AC
  Administered 2019-12-26: 4 mg via INTRAVENOUS
  Filled 2019-12-26: qty 2

## 2019-12-26 MED ORDER — LACTATED RINGERS IV BOLUS
1000.0000 mL | Freq: Once | INTRAVENOUS | Status: AC
  Administered 2019-12-26: 1000 mL via INTRAVENOUS

## 2019-12-26 MED ORDER — ONDANSETRON 4 MG PO TBDP: 4.0000 mg | ORAL_TABLET | Freq: Four times a day (QID) | ORAL | 0 refills | Status: DC | PRN

## 2019-12-26 MED ORDER — PANTOPRAZOLE SODIUM 20 MG PO TBEC: 20.0000 mg | DELAYED_RELEASE_TABLET | Freq: Every day | ORAL | 1 refills | Status: DC

## 2019-12-26 NOTE — MAU Note (Signed)
At bedside to give pt meds for nausea, pt currently vomiting and urinated large amount in bed. Assisted to clean herself up and linen changed

## 2019-12-26 NOTE — Discharge Instructions (Signed)
Cannabinoid Hyperemesis Syndrome Cannabinoid hyperemesis syndrome (CHS) is a condition that causes repeated nausea, vomiting, and abdominal pain after long-term (chronic) use of marijuana (cannabis). People with CHS typically use marijuana 3-5 times a day for many years before they have symptoms, although it is possible to develop CHS with as little as 1 use per day. Symptoms of CHS may be mild at first but can get worse and more frequent. In some cases, CHS may cause vomiting many times a day, which can lead to weight loss and dehydration. CHS may go away and come back many times (recur). People may not have symptoms or may otherwise be healthy in between CHS attacks. What are the causes? The exact cause of this condition is not known. Long-term use of marijuana may over-stimulate certain proteins in the brain that react with chemicals in marijuana (cannabinoid receptors). This over-stimulation may cause CHS. What are the signs or symptoms? Symptoms of this condition are often mild during the first few attacks, but they can get worse over time. Symptoms may include:  Frequent nausea, especially early in the morning.  Vomiting.  Abdominal pain. Taking several hot showers throughout the day can also be a sign of this condition. People with CHS may do this because it relieves symptoms. How is this diagnosed? This condition may be diagnosed based on:  Your symptoms and medical history, including any drug use.  A physical exam. You may have tests done to rule out other problems. These tests may include:  Blood tests.  Urine tests.  Imaging tests, such as an X-ray or CT scan. How is this treated? Treatment for this condition involves stopping marijuana use. Your health care provider may recommend:  A drug rehabilitation program, if you have trouble stopping marijuana use.  Medicines for nausea.  Hot showers to help relieve symptoms. Certain creams that contain a substance called  capsaicin may improve symptoms when applied to the abdomen. Ask your health care provider before starting any medicines or other treatments. Severe nausea and vomiting may require you to stay at the hospital. You may need IV fluids to prevent or treat dehydration. You may also need certain medicines that must be given at the hospital. Follow these instructions at home: During an attack   Stay in bed and rest in a dark, quiet room.  Take anti-nausea medicine as told by your health care provider.  Try taking hot showers to relieve your symptoms. After an attack  Drink small amounts of clear fluids slowly. Gradually add more.  Once you are able to eat without vomiting, eat soft foods in small amounts every 3-4 hours. General instructions   Do not use any products that contain marijuana.If you need help quitting, ask your health care provider for resources and treatment options.  Drink enough fluid to keep your urine pale yellow. Avoid drinking fluids that have a lot of sugar or caffeine, such as coffee and soda.  Take and apply over-the-counter and prescription medicines only as told by your health care provider. Ask your health care provider before starting any new medicines or treatments.  Keep all follow-up visits as told by your health care provider. This is important. Contact a health care provider if:  Your symptoms get worse.  You cannot drink fluids without vomiting.  You have pain and trouble swallowing after an attack. Get help right away if:  You cannot stop vomiting.  You have blood in your vomit or your vomit looks like coffee grounds.  You have   severe abdominal pain.  You have stools that are bloody or black, or stools that look like tar.  You have symptoms of dehydration, such as: ? Sunken eyes. ? Inability to make tears. ? Cracked lips. ? Dry mouth. ? Decreased urine production. ? Weakness. ? Sleepiness. ? Fainting. Summary  Cannabinoid hyperemesis  syndrome (CHS) is a condition that causes repeated nausea, vomiting, and abdominal pain after long-term use of marijuana.  People with CHS typically use marijuana 3-5 times a day for many years before they have symptoms, although it is possible to develop CHS with as little as 1 use per day.  Treatment for this condition involves stopping marijuana use. Hot showers and capsaicin creams may also help relieve symptoms. Ask your health care provider before starting any medicines or other treatments.  Your health care provider may prescribe medicines to help with nausea.  Get help right away if you have signs of dehydration, such as dry mouth, decreased urine production, or weakness. This information is not intended to replace advice given to you by your health care provider. Make sure you discuss any questions you have with your health care provider. Document Revised: 05/18/2017 Document Reviewed: 04/19/2016 Elsevier Patient Education  2020 [employer]  

## 2019-12-27 ENCOUNTER — Inpatient Hospital Stay (HOSPITAL_COMMUNITY)
Admission: AD | Admit: 2019-12-27 | Discharge: 2019-12-27 | Disposition: A | Discharge status: Home / Self Care | Payer: Medicaid Other | Attending: Obstetrics and Gynecology | Admitting: Obstetrics and Gynecology

## 2019-12-27 ENCOUNTER — Encounter (HOSPITAL_COMMUNITY): Payer: Self-pay | Admitting: Obstetrics and Gynecology

## 2019-12-27 DIAGNOSIS — O219 Vomiting of pregnancy, unspecified: Secondary | ICD-10-CM

## 2019-12-27 DIAGNOSIS — Z3A17 17 weeks gestation of pregnancy: Secondary | ICD-10-CM | POA: Diagnosis not present

## 2019-12-27 DIAGNOSIS — O26892 Other specified pregnancy related conditions, second trimester: Secondary | ICD-10-CM | POA: Diagnosis not present

## 2019-12-27 DIAGNOSIS — F1290 Cannabis use, unspecified, uncomplicated: Secondary | ICD-10-CM | POA: Insufficient documentation

## 2019-12-27 DIAGNOSIS — Z79899 Other long term (current) drug therapy: Secondary | ICD-10-CM | POA: Diagnosis not present

## 2019-12-27 DIAGNOSIS — R112 Nausea with vomiting, unspecified: Secondary | ICD-10-CM

## 2019-12-27 DIAGNOSIS — F12188 Cannabis abuse with other cannabis-induced disorder: Secondary | ICD-10-CM

## 2019-12-27 DIAGNOSIS — Z87891 Personal history of nicotine dependence: Secondary | ICD-10-CM | POA: Insufficient documentation

## 2019-12-27 DIAGNOSIS — O99322 Drug use complicating pregnancy, second trimester: Secondary | ICD-10-CM | POA: Insufficient documentation

## 2019-12-27 DIAGNOSIS — O210 Mild hyperemesis gravidarum: Secondary | ICD-10-CM | POA: Diagnosis not present

## 2019-12-27 LAB — URINALYSIS, ROUTINE W REFLEX MICROSCOPIC
Bilirubin Urine: NEGATIVE
Glucose, UA: 50 mg/dL — AB
Hgb urine dipstick: NEGATIVE
Ketones, ur: 80 mg/dL — AB
Leukocytes,Ua: NEGATIVE
Nitrite: NEGATIVE
Protein, ur: 100 mg/dL — AB
Specific Gravity, Urine: 1.026 (ref 1.005–1.030)
pH: 5 (ref 5.0–8.0)

## 2019-12-27 LAB — CBC
HCT: 38.7 % (ref 36.0–46.0)
Hemoglobin: 12.6 g/dL (ref 12.0–15.0)
MCH: 26.8 pg (ref 26.0–34.0)
MCHC: 32.6 g/dL (ref 30.0–36.0)
MCV: 82.2 fL (ref 80.0–100.0)
Platelets: 209 10*3/uL (ref 150–400)
RBC: 4.71 MIL/uL (ref 3.87–5.11)
RDW: 13.6 % (ref 11.5–15.5)
WBC: 12.5 10*3/uL — ABNORMAL HIGH (ref 4.0–10.5)
nRBC: 0 % (ref 0.0–0.2)

## 2019-12-27 LAB — COMPREHENSIVE METABOLIC PANEL
ALT: 22 U/L (ref 0–44)
AST: 24 U/L (ref 15–41)
Albumin: 3.3 g/dL — ABNORMAL LOW (ref 3.5–5.0)
Alkaline Phosphatase: 45 U/L (ref 38–126)
Anion gap: 14 (ref 5–15)
BUN: 10 mg/dL (ref 6–20)
CO2: 21 mmol/L — ABNORMAL LOW (ref 22–32)
Calcium: 9.2 mg/dL (ref 8.9–10.3)
Chloride: 103 mmol/L (ref 98–111)
Creatinine, Ser: 0.6 mg/dL (ref 0.44–1.00)
GFR, Estimated: >60 mL/min (ref >60)
Glucose, Bld: 116 mg/dL — ABNORMAL HIGH (ref 70–99)
Potassium: 3.3 mmol/L — ABNORMAL LOW (ref 3.5–5.1)
Sodium: 138 mmol/L (ref 135–145)
Total Bilirubin: 0.5 mg/dL (ref 0.3–1.2)
Total Protein: 6.9 g/dL (ref 6.5–8.1)

## 2019-12-27 MED ORDER — FAMOTIDINE IN NACL 20-0.9 MG/50ML-% IV SOLN
20.0000 mg | Freq: Once | INTRAVENOUS | Status: AC
  Administered 2019-12-27: 20 mg via INTRAVENOUS
  Filled 2019-12-27: qty 50

## 2019-12-27 MED ORDER — ONDANSETRON HCL 4 MG/2ML IJ SOLN
4.0000 mg | Freq: Once | INTRAMUSCULAR | Status: AC
  Administered 2019-12-27: 4 mg via INTRAVENOUS
  Filled 2019-12-27: qty 2

## 2019-12-27 MED ORDER — LACTATED RINGERS IV BOLUS
1000.0000 mL | Freq: Once | INTRAVENOUS | Status: AC
  Administered 2019-12-27: 1000 mL via INTRAVENOUS

## 2019-12-27 MED ORDER — HALOPERIDOL LACTATE 5 MG/ML IJ SOLN
5.0000 mg | Freq: Once | INTRAMUSCULAR | Status: AC
  Administered 2019-12-27: 5 mg via INTRAVENOUS
  Filled 2019-12-27: qty 1

## 2019-12-27 NOTE — MAU Note (Addendum)
EMS arrival. Pt reports she had had N/V x3 days reports emesis id bloody.C/O abd and feeling dizzy. Denies any vag bleeding or discharge. Has not picked up anti emetic RX.

## 2019-12-27 NOTE — MAU Provider Note (Addendum)
History     CSN: [id number]  Arrival date and time: 12/27/19 1517   First Provider Initiated Contact with Patient 12/27/19 1546      Chief Complaint  Patient presents with  . Emesis   HPI Jacqueline [facility] is a 29 y.o. G2P0010 at 34w2dwho presents with sharp upper abdominal pain and vomiting. She states this is the same pain she has every time she comes to the hospital. She was seen on 12/2 for the same complaint and prescribed multiple medications that she did not pick up from the pharmacy. She states she has been vomiting ever since. She denies any cramping, vaginal bleeding or discharge. [name] lying on her side in the fetal position upon arrival.   OB History    Gravida  2   Para      Term      Preterm      AB  1   Living        SAB  1   TAB      Ectopic      Multiple      Live Births              Past Medical History:  Diagnosis Date  . Abdominal pain   . Cannabinoid hyperemesis syndrome   . Chlamydia infection complicating pregnancy in second trimester 08/18/2018   Negative test of cure on 08/06/18  . Transient hypertension of pregnancy 10/12/2019    Past Surgical History:  Procedure Laterality Date  . WISDOM TOOTH EXTRACTION      Family History  Problem Relation Age of Onset  . Diabetes Mother   . Hypertension Mother     Social History   Tobacco Use  . Smoking status: Former Smoker    Packs/day: 0.50    Quit date: 12/24/2019  . Smokeless tobacco: Never Used  Vaping Use  . Vaping Use: Never used  Substance Use Topics  . Alcohol use: No  . Drug use: Not on file    Comment: yesterday 12/24/2019    Allergies: No Known Allergies  Medications Prior to Admission  Medication Sig Dispense Refill Last Dose  . Blood Pressure Monitor KIT 1 Device by Does not apply route once a week. To be monitored Regularly at home. (Patient not taking: Reported on 12/09/2019) 1 kit 0   . ondansetron (ZOFRAN ODT) 4 MG disintegrating tablet Take 1 tablet  (4 mg total) by mouth every 6 (six) hours as needed for nausea. 20 tablet 0   . pantoprazole (PROTONIX) 20 MG tablet Take 1 tablet (20 mg total) by mouth daily. 30 tablet 1   . Prenatal Vit-Fe Fumarate-FA (PREPLUS) 27-1 MG TABS Take 1 tablet by mouth daily. 30 tablet 13   . promethazine (PHENERGAN) 25 MG tablet Take 1 tablet (25 mg total) by mouth every 6 (six) hours as needed for nausea or vomiting. 30 tablet 2   . [START ON 12/28/2019] scopolamine (TRANSDERM-SCOP) 1 MG/3DAYS Place 1 patch (1.5 mg total) onto the skin every 3 (three) days. 10 patch 12     Review of Systems  Constitutional: Negative.  Negative for fatigue and fever.  HENT: Negative.   Respiratory: Negative.  Negative for shortness of breath.   Cardiovascular: Negative.  Negative for chest pain.  Gastrointestinal: Positive for abdominal pain, nausea and vomiting. Negative for constipation and diarrhea.  Genitourinary: Negative.  Negative for dysuria.  Neurological: Negative.  Negative for dizziness and headaches.   Physical Exam   Blood pressure (Marland Kitchen  104/40, pulse 72, last menstrual period 09/17/2019.  Physical Exam Vitals and nursing note reviewed.  Constitutional:      General: [name] not in acute distress.    Appearance: [name] well-developed.  HENT:     Head: Normocephalic.  Eyes:     Pupils: Pupils are equal, round, and reactive to light.  Cardiovascular:     Rate and Rhythm: Normal rate and regular rhythm.     Heart sounds: Normal heart sounds.  Pulmonary:     Effort: Pulmonary effort is normal. No respiratory distress.     Breath sounds: Normal breath sounds.  Abdominal:     General: Bowel sounds are normal. There is no distension.     Palpations: Abdomen is soft.     Tenderness: There is no abdominal tenderness.  Skin:    General: Skin is warm and dry.  Neurological:     Mental Status: [name] alert and oriented to person, place, and time.  Psychiatric:        Behavior: Behavior normal.        Thought  Content: Thought content normal.        Judgment: Judgment normal.    FHT: 154 bpm  MAU Course  Procedures Results for orders placed or performed during the hospital encounter of 12/27/19 (from the past 24 hour(s))  Urinalysis, Routine w reflex microscopic Urine, Clean Catch     Status: Abnormal   Collection Time: 12/27/19  3:32 PM  Result Value Ref Range   Color, Urine YELLOW YELLOW   APPearance HAZY (A) CLEAR   Specific Gravity, Urine 1.026 1.005 - 1.030   pH 5.0 5.0 - 8.0   Glucose, UA 50 (A) NEGATIVE mg/dL   Hgb urine dipstick NEGATIVE NEGATIVE   Bilirubin Urine NEGATIVE NEGATIVE   Ketones, ur 80 (A) NEGATIVE mg/dL   Protein, ur 100 (A) NEGATIVE mg/dL   Nitrite NEGATIVE NEGATIVE   Leukocytes,Ua NEGATIVE NEGATIVE   RBC / HPF 0-5 0 - 5 RBC/hpf   WBC, UA 0-5 0 - 5 WBC/hpf   Bacteria, UA RARE (A) NONE SEEN   Squamous Epithelial / LPF 6-10 0 - 5   Mucus PRESENT   CBC     Status: Abnormal   Collection Time: 12/27/19  4:18 PM  Result Value Ref Range   WBC 12.5 (H) 4.0 - 10.5 K/uL   RBC 4.71 3.87 - 5.11 MIL/uL   Hemoglobin 12.6 12.0 - 15.0 g/dL   HCT 38.7 36 - 46 %   MCV 82.2 80.0 - 100.0 fL   MCH 26.8 26.0 - 34.0 pg   MCHC 32.6 30.0 - 36.0 g/dL   RDW 13.6 11.5 - 15.5 %   Platelets 209 150 - 400 K/uL   nRBC 0.0 0.0 - 0.2 %  Comprehensive metabolic panel     Status: Abnormal   Collection Time: 12/27/19  4:18 PM  Result Value Ref Range   Sodium 138 135 - 145 mmol/L   Potassium 3.3 (L) 3.5 - 5.1 mmol/L   Chloride 103 98 - 111 mmol/L   CO2 21 (L) 22 - 32 mmol/L   Glucose, Bld 116 (H) 70 - 99 mg/dL   BUN 10 6 - 20 mg/dL   Creatinine, Ser 0.60 0.44 - 1.00 mg/dL   Calcium 9.2 8.9 - 10.3 mg/dL   Total Protein 6.9 6.5 - 8.1 g/dL   Albumin 3.3 (L) 3.5 - 5.0 g/dL   AST 24 15 - 41 U/L   ALT 22 0 -  44 U/L   Alkaline Phosphatase 45 38 - 126 U/L   Total Bilirubin 0.5 0.3 - 1.2 mg/dL   GFR, Estimated >60 >60 mL/min   Anion gap 14 5 - 15   MDM UA LR bolus CBC, CMP Haldol  IV Zofran IV Pepcid IV  Patient ambulating around room and asking for PO fluids. Called out stating she is ready to go home.   Assessment and Plan   1. Nausea/vomiting in pregnancy   2. Cannabinoid hyperemesis syndrome   3. [redacted] weeks gestation of pregnancy    -Discharge home in stable condition -Patient instructed to pick up previously prescribed medication before going home -Nausea and vomiting precautions discussed -Patient advised to follow-up with OB as scheduled for prenatal care -Patient may return to MAU as needed or if her condition were to change or worsen   [place] 12/27/2019, 6:06 PM

## 2019-12-27 NOTE — Discharge Instructions (Signed)

## 2020-01-06 ENCOUNTER — Encounter: Payer: Medicaid Other | Admitting: Obstetrics and Gynecology

## 2020-01-07 ENCOUNTER — Encounter: Payer: Medicaid Other | Admitting: Obstetrics & Gynecology

## 2020-01-08 ENCOUNTER — Other Ambulatory Visit: Payer: Self-pay | Admitting: *Deleted

## 2020-01-08 ENCOUNTER — Other Ambulatory Visit: Payer: Self-pay | Admitting: Obstetrics and Gynecology

## 2020-01-08 ENCOUNTER — Ambulatory Visit: Payer: Medicaid Other | Attending: Obstetrics and Gynecology

## 2020-01-08 ENCOUNTER — Other Ambulatory Visit: Payer: Self-pay

## 2020-01-08 DIAGNOSIS — O0992 Supervision of high risk pregnancy, unspecified, second trimester: Secondary | ICD-10-CM

## 2020-01-08 DIAGNOSIS — O0990 Supervision of high risk pregnancy, unspecified, unspecified trimester: Secondary | ICD-10-CM

## 2020-01-08 DIAGNOSIS — Z3A19 19 weeks gestation of pregnancy: Secondary | ICD-10-CM | POA: Diagnosis not present

## 2020-01-08 DIAGNOSIS — O99212 Obesity complicating pregnancy, second trimester: Secondary | ICD-10-CM | POA: Diagnosis not present

## 2020-01-08 DIAGNOSIS — Z363 Encounter for antenatal screening for malformations: Secondary | ICD-10-CM | POA: Diagnosis not present

## 2020-01-08 DIAGNOSIS — Z6841 Body Mass Index (BMI) 40.0 and over, adult: Secondary | ICD-10-CM

## 2020-01-08 DIAGNOSIS — Z148 Genetic carrier of other disease: Secondary | ICD-10-CM

## 2020-01-21 ENCOUNTER — Other Ambulatory Visit: Payer: Self-pay

## 2020-01-21 ENCOUNTER — Ambulatory Visit (INDEPENDENT_AMBULATORY_CARE_PROVIDER_SITE_OTHER): Payer: Medicaid Other | Admitting: Obstetrics & Gynecology

## 2020-01-21 VITALS — BP 114/77 | HR 75 | Wt 284.0 lb

## 2020-01-21 DIAGNOSIS — O361920 Maternal care for other isoimmunization, second trimester, not applicable or unspecified: Secondary | ICD-10-CM

## 2020-01-21 DIAGNOSIS — O99331 Smoking (tobacco) complicating pregnancy, first trimester: Secondary | ICD-10-CM

## 2020-01-21 DIAGNOSIS — O0990 Supervision of high risk pregnancy, unspecified, unspecified trimester: Secondary | ICD-10-CM

## 2020-01-21 DIAGNOSIS — D563 Thalassemia minor: Secondary | ICD-10-CM

## 2020-01-21 DIAGNOSIS — Z3A20 20 weeks gestation of pregnancy: Secondary | ICD-10-CM

## 2020-01-21 DIAGNOSIS — F1210 Cannabis abuse, uncomplicated: Secondary | ICD-10-CM

## 2020-01-21 MED ORDER — ASPIRIN EC 81 MG PO TBEC: 81.0000 mg | DELAYED_RELEASE_TABLET | Freq: Every day | ORAL | 11 refills | Status: DC

## 2020-01-21 MED ORDER — VITAFOL GUMMIES 3.33-0.333-34.8 MG PO CHEW: 3.0000 | CHEWABLE_TABLET | Freq: Every day | ORAL | 3 refills | Status: DC

## 2020-01-21 NOTE — Progress Notes (Signed)
Patient presents for ROB. Patient needs AFP done today. Patient is requesting vitafol gummies to be sent to the pharmacy. Rx pended. Informed patient that these may not be covered by her insurance plan.

## 2020-01-21 NOTE — Progress Notes (Signed)
   PRENATAL VISIT NOTE  Subjective:  Jacqueline Shepherd is a 29 y.o. G2P0010 at [redacted]w[redacted]d being seen today for ongoing prenatal care.  [name] currently monitored for the following issues for this high-risk pregnancy and has Chlamydia infection affecting pregnancy in second trimester; Rubella non-immune status, antepartum; Supervision of high risk pregnancy, antepartum; Cannabinoid hyperemesis syndrome; Obesity in pregnancy; BMI 40.0-44.9, adult (HCC); Weight loss; Lewis isoimmunization during pregnancy; [redacted] weeks gestation of pregnancy; Tobacco use in pregnancy, antepartum, first trimester; Other social stressor; Hyperemesis gravidarum, antepartum; Alpha thalassemia silent carrier; and Abnormal TSH on their problem list.  Patient reports nausea and emesis.  Contractions: Not present. Vag. Bleeding: None.  Movement: Present. Denies leaking of fluid.   The following portions of the patient's history were reviewed and updated as appropriate: allergies, current medications, past family history, past medical history, past social history, past surgical history and problem list.   Objective:   Vitals:   01/21/20 1121  BP: 114/77  Pulse: 75  Weight: 284 lb (128.8 kg)    Fetal Status: Fetal Heart Rate (bpm): 145   Movement: Present     General:  Alert, oriented and cooperative. Patient is in no acute distress.  Skin: Skin is warm and dry. No rash noted.   Cardiovascular: Normal heart rate noted  Respiratory: Normal respiratory effort, no problems with respiration noted  Abdomen: Soft, gravid, appropriate for gestational age.  Pain/Pressure: Absent     Pelvic: Cervical exam deferred        Extremities: Normal range of motion.  Edema: Trace  Mental Status: Normal mood and affect. Normal behavior. Normal judgment and thought content.   Assessment and Plan:  Pregnancy: G2P0010 at [redacted]w[redacted]d 1. [redacted] weeks gestation of pregnancy - On PNV.  She would like a gummy. rx to pharmacy. - advised to start baby ASA -  attempt at blood draw attempted today.  Not successful.  Will return within the week and try to hydrate before she comes.   2. Supervision of high risk pregnancy, antepartum - repeat ultrasound scheduled for 02/12/2020  3. Lewis isoimmunization during pregnancy in second trimester, single or unspecified fetus  4. Alpha thalassemia silent carrier - partner not involved.  Carrier testing not possible at this time.  5. Tobacco use in pregnancy, antepartum, first trimester  6. Cannabis abuse, continuous - d/w pt today.  Refuses to stop.  Preterm labor symptoms and general obstetric precautions including but not limited to vaginal bleeding, contractions, leaking of fluid and fetal movement were reviewed in detail with the patient. Please refer to After Visit Summary for other counseling recommendations.   Return in about 4 weeks (around 02/18/2020) for Office Ob visit (MD only).  Future Appointments  Date Time Provider Department Center  02/12/2020  2:30 PM [facility]    Jerene Bears, MD

## 2020-01-24 ENCOUNTER — Inpatient Hospital Stay (HOSPITAL_COMMUNITY)
Admission: AD | Admit: 2020-01-24 | Discharge: 2020-01-25 | Disposition: A | Discharge status: Home / Self Care | Payer: Medicaid Other | Attending: Obstetrics & Gynecology | Admitting: Obstetrics & Gynecology

## 2020-01-24 ENCOUNTER — Encounter (HOSPITAL_COMMUNITY): Payer: Self-pay | Admitting: Obstetrics & Gynecology

## 2020-01-24 DIAGNOSIS — Z3A21 21 weeks gestation of pregnancy: Secondary | ICD-10-CM | POA: Diagnosis not present

## 2020-01-24 DIAGNOSIS — O99891 Other specified diseases and conditions complicating pregnancy: Secondary | ICD-10-CM | POA: Diagnosis not present

## 2020-01-24 DIAGNOSIS — R112 Nausea with vomiting, unspecified: Secondary | ICD-10-CM

## 2020-01-24 DIAGNOSIS — F12188 Cannabis abuse with other cannabis-induced disorder: Secondary | ICD-10-CM | POA: Insufficient documentation

## 2020-01-24 DIAGNOSIS — Z87891 Personal history of nicotine dependence: Secondary | ICD-10-CM | POA: Diagnosis not present

## 2020-01-24 DIAGNOSIS — Z7982 Long term (current) use of aspirin: Secondary | ICD-10-CM | POA: Insufficient documentation

## 2020-01-24 DIAGNOSIS — R109 Unspecified abdominal pain: Secondary | ICD-10-CM | POA: Diagnosis present

## 2020-01-24 DIAGNOSIS — O99322 Drug use complicating pregnancy, second trimester: Secondary | ICD-10-CM | POA: Insufficient documentation

## 2020-01-24 LAB — URINALYSIS, ROUTINE W REFLEX MICROSCOPIC
Bilirubin Urine: NEGATIVE
Glucose, UA: NEGATIVE mg/dL
Hgb urine dipstick: NEGATIVE
Ketones, ur: 5 mg/dL — AB
Nitrite: NEGATIVE
Protein, ur: NEGATIVE mg/dL
Specific Gravity, Urine: 1.009 (ref 1.005–1.030)
pH: 7 (ref 5.0–8.0)

## 2020-01-24 MED ORDER — ONDANSETRON HCL 4 MG/2ML IJ SOLN
4.0000 mg | Freq: Once | INTRAMUSCULAR | Status: AC
  Administered 2020-01-24: 4 mg via INTRAVENOUS
  Filled 2020-01-24: qty 2

## 2020-01-24 MED ORDER — HALOPERIDOL LACTATE 5 MG/ML IJ SOLN
5.0000 mg | Freq: Four times a day (QID) | INTRAMUSCULAR | Status: DC | PRN
  Administered 2020-01-24: 5 mg via INTRAVENOUS
  Filled 2020-01-24 (×2): qty 1

## 2020-01-24 MED ORDER — LACTATED RINGERS IV BOLUS
1000.0000 mL | Freq: Once | INTRAVENOUS | Status: AC
  Administered 2020-01-24: 1000 mL via INTRAVENOUS

## 2020-01-24 MED ORDER — DEXAMETHASONE SODIUM PHOSPHATE 10 MG/ML IJ SOLN
10.0000 mg | Freq: Once | INTRAMUSCULAR | Status: AC
  Administered 2020-01-24: 10 mg via INTRAVENOUS
  Filled 2020-01-24: qty 1

## 2020-01-24 NOTE — MAU Note (Signed)
IV attempt x 2 unsuccessful-order for IV team placed awaiting IV start for hydration and medications

## 2020-01-24 NOTE — MAU Note (Signed)
G2P0 at 21.2 weeks with complaints of nausea/vomiting and abdominal pain that started this am. Pt denies contractions, vaginal bleeding or bloody show.

## 2020-01-25 DIAGNOSIS — O99322 Drug use complicating pregnancy, second trimester: Secondary | ICD-10-CM | POA: Diagnosis not present

## 2020-01-25 DIAGNOSIS — R112 Nausea with vomiting, unspecified: Secondary | ICD-10-CM | POA: Diagnosis not present

## 2020-01-25 DIAGNOSIS — F12188 Cannabis abuse with other cannabis-induced disorder: Secondary | ICD-10-CM

## 2020-01-25 DIAGNOSIS — Z3A21 21 weeks gestation of pregnancy: Secondary | ICD-10-CM

## 2020-01-25 DIAGNOSIS — O99891 Other specified diseases and conditions complicating pregnancy: Secondary | ICD-10-CM | POA: Diagnosis not present

## 2020-01-25 LAB — WET PREP, GENITAL
Clue Cells Wet Prep HPF POC: NONE SEEN
Sperm: NONE SEEN
Trich, Wet Prep: NONE SEEN
Yeast Wet Prep HPF POC: NONE SEEN

## 2020-01-25 MED ORDER — LIDOCAINE VISCOUS HCL 2 % MT SOLN
15.0000 mL | Freq: Once | OROMUCOSAL | Status: DC
  Filled 2020-01-25: qty 15

## 2020-01-25 MED ORDER — DICYCLOMINE HCL 10 MG/5ML PO SOLN
10.0000 mg | Freq: Once | ORAL | Status: AC
  Administered 2020-01-25: 10 mg via ORAL
  Filled 2020-01-25 (×2): qty 5

## 2020-01-25 MED ORDER — ALUM & MAG HYDROXIDE-SIMETH 200-200-20 MG/5ML PO SUSP
30.0000 mL | Freq: Once | ORAL | Status: DC
  Filled 2020-01-25: qty 30

## 2020-01-25 MED ORDER — CYCLOBENZAPRINE HCL 5 MG PO TABS
10.0000 mg | ORAL_TABLET | Freq: Once | ORAL | Status: AC
  Administered 2020-01-25: 10 mg via ORAL
  Filled 2020-01-25: qty 2

## 2020-01-25 NOTE — MAU Provider Note (Cosign Needed)
Chief Complaint:  Emesis, Nausea, and Abdominal Pain   Event Date/Time   First Provider Initiated Contact with Patient 01/24/20 2226     HPI: Jacqueline Shepherd is a 30 y.o. G2P0010 at [redacted]w[redacted]d who presents to maternity admissions via EMS reporting severe "all over" abdominal pain and nausea/vomiting that began when she got up today at 12pm. She has been unable to keep down food or fluids since then. She has a history of cannabinoid hyperemesis visits to MAU but states she has since quit using marijuana (two days ago). Denies vaginal bleeding, leaking of fluid, decreased fetal movement, fever, falls, or recent illness.   Past Medical History:  Diagnosis Date  . Abdominal pain   . Cannabinoid hyperemesis syndrome   . Chlamydia infection complicating pregnancy in second trimester 08/18/2018   Negative test of cure on 08/06/18  . Transient hypertension of pregnancy 10/12/2019   OB History  Gravida Para Term Preterm AB Living  2       1    SAB IAB Ectopic Multiple Live Births  1            # Outcome Date GA Lbr Len/2nd Weight Sex Delivery Anes PTL Lv  2 Current           1 SAB            Past Surgical History:  Procedure Laterality Date  . WISDOM TOOTH EXTRACTION     Family History  Problem Relation Age of Onset  . Diabetes Mother   . Hypertension Mother    Social History   Tobacco Use  . Smoking status: Former Smoker    Packs/day: 0.50    Quit date: 01/22/2020  . Smokeless tobacco: Never Used  Vaping Use  . Vaping Use: Never used  Substance Use Topics  . Alcohol use: No   No Known Allergies Medications Prior to Admission  Medication Sig Dispense Refill Last Dose  . Prenatal Vit-Fe Phos-FA-Omega (VITAFOL GUMMIES) 3.33-0.333-34.8 MG CHEW Chew 3 each by mouth daily. 90 tablet 3 01/24/2020 at Unknown time  . aspirin EC 81 MG tablet Take 1 tablet (81 mg total) by mouth daily. Swallow whole. 30 tablet 11   . Blood Pressure Monitor KIT 1 Device by Does not apply route once a week. To  be monitored Regularly at home. 1 kit 0   . ondansetron (ZOFRAN ODT) 4 MG disintegrating tablet Take 1 tablet (4 mg total) by mouth every 6 (six) hours as needed for nausea. 20 tablet 0   . Prenatal Vit-Fe Fumarate-FA (PREPLUS) 27-1 MG TABS Take 1 tablet by mouth daily. 30 tablet 13   . promethazine (PHENERGAN) 25 MG tablet Take 1 tablet (25 mg total) by mouth every 6 (six) hours as needed for nausea or vomiting. 30 tablet 2   . scopolamine (TRANSDERM-SCOP) 1 MG/3DAYS Place 1 patch (1.5 mg total) onto the skin every 3 (three) days. 10 patch 12     I have reviewed patient's Past Medical Hx, Surgical Hx, Family Hx, Social Hx, medications and allergies.   ROS:  Review of Systems  Constitutional: Negative for fatigue and fever.  Respiratory: Negative for cough and shortness of breath.   Gastrointestinal: Positive for abdominal pain, nausea and vomiting. Negative for constipation and diarrhea.  Genitourinary: Negative for decreased urine volume, difficulty urinating, flank pain, frequency, pelvic pain, urgency, vaginal bleeding and vaginal discharge.  Musculoskeletal: Negative for back pain.  Neurological: Negative for syncope and headaches.    Physical Exam  Patient Vitals for the past 24 hrs:  BP Temp Temp src Pulse Resp SpO2  01/24/20 2118 (!) 156/89 98.9 F (37.2 C) Oral 76 19 100 %   Constitutional: Well-developed, well-nourished female moaning loudly in pain, difficulty remaining still for IV insertion, vomited once on admission to MAU Cardiovascular: normal rate & rhythm, no murmur Respiratory: normal effort, lung sounds clear throughout GI: Abd soft but guarded, did not allow palpation "it just hurts all over", gravid appropriate for gestational age. Pos BS x 4 MS: Extremities nontender, no edema, normal ROM Neurologic: Alert and oriented x 4.  GU: no CVA tenderness Pelvic exam deferred  FHR: 152   Labs: Results for orders placed or performed during the hospital encounter of  01/24/20 (from the past 24 hour(s))  Urinalysis, Routine w reflex microscopic Urine, Clean Catch     Status: Abnormal   Collection Time: 01/24/20 10:58 PM  Result Value Ref Range   Color, Urine YELLOW YELLOW   APPearance CLOUDY (A) CLEAR   Specific Gravity, Urine 1.009 1.005 - 1.030   pH 7.0 5.0 - 8.0   Glucose, UA NEGATIVE NEGATIVE mg/dL   Hgb urine dipstick NEGATIVE NEGATIVE   Bilirubin Urine NEGATIVE NEGATIVE   Ketones, ur 5 (A) NEGATIVE mg/dL   Protein, ur NEGATIVE NEGATIVE mg/dL   Nitrite NEGATIVE NEGATIVE   Leukocytes,Ua TRACE (A) NEGATIVE   RBC / HPF 0-5 0 - 5 RBC/hpf   WBC, UA 0-5 0 - 5 WBC/hpf   Bacteria, UA RARE (A) NONE SEEN   Squamous Epithelial / LPF 0-5 0 - 5   Mucus PRESENT    Amorphous Crystal PRESENT   Wet prep, genital     Status: Abnormal   Collection Time: 01/25/20 12:35 AM   Specimen: Vaginal  Result Value Ref Range   Yeast Wet Prep HPF POC NONE SEEN NONE SEEN   Trich, Wet Prep NONE SEEN NONE SEEN   Clue Cells Wet Prep HPF POC NONE SEEN NONE SEEN   WBC, Wet Prep HPF POC FEW (A) NONE SEEN   Sperm NONE SEEN    Imaging:  No results found.  MAU Course: Orders Placed This Encounter  Procedures  . Wet prep, genital  . Urinalysis, Routine w reflex microscopic Urine, Clean Catch  . Rapid urine drug screen (hospital performed)  . Discharge patient   Meds ordered this encounter  Medications  . lactated ringers bolus 1,000 mL  . ondansetron (ZOFRAN) injection 4 mg  . haloperidol lactate (HALDOL) injection 5 mg  . dexamethasone (DECADRON) injection 10 mg  . cyclobenzaprine (FLEXERIL) tablet 10 mg  . DISCONTD: alum & mag hydroxide-simeth (MAALOX/MYLANTA) 200-200-20 MG/5ML suspension 30 mL  . DISCONTD: lidocaine (XYLOCAINE) 2 % viscous mouth solution 15 mL  . dicyclomine (BENTYL) 10 MG/5ML solution 10 mg    MDM: LR bolus, haldol, decadron and zofran given. Pt initially reported no relief of nausea (but did not vomit again while in MAU). Also complained on  reassessment of lower abdominal cramping. Wet prep and gc/ct collected, no bleeding noted. Wet prep normal, GC/CT pending.  Pt called out and told RN her "stomach still hurts" and indicated it was more upper GI than lower abdominal. GI cocktail ordered, bentyl given but pt declined the rest. Within 32min she had tolerated PO intake without further vomiting and asked if she could go home.  Assessment: 1. Cannabinoid hyperemesis syndrome     Plan: Discharge home in stable condition with preterm labor precautions.  See AVS for additional patient  education   Follow-up Information    [facility]. Go to.   Specialty: Obstetrics and Gynecology Why: as scheduled for ongoing prenatal care Contact information: [address], [place] [phone]              Allergies as of 01/25/2020   No Known Allergies     Medication List    TAKE these medications   aspirin EC 81 MG tablet Take 1 tablet (81 mg total) by mouth daily. Swallow whole.   Blood Pressure Monitor Kit 1 Device by Does not apply route once a week. To be monitored Regularly at home.   ondansetron 4 MG disintegrating tablet Commonly known as: Zofran ODT Take 1 tablet (4 mg total) by mouth every 6 (six) hours as needed for nausea.   PrePLUS 27-1 MG Tabs Take 1 tablet by mouth daily.   promethazine 25 MG tablet Commonly known as: PHENERGAN Take 1 tablet (25 mg total) by mouth every 6 (six) hours as needed for nausea or vomiting.   scopolamine 1 MG/3DAYS Commonly known as: TRANSDERM-SCOP Place 1 patch (1.5 mg total) onto the skin every 3 (three) days.   Vitafol Gummies 3.33-0.333-34.8 MG Chew Chew 3 each by mouth daily.       Gaylan Gerold, CNM, MSN, [facility] 01/25/20 3:24 AM

## 2020-01-25 NOTE — MAU Note (Signed)
Pt sleeping with eyes closed-awakened for po flexeril.

## 2020-01-25 NOTE — Discharge Instructions (Signed)
Marijuana Use During Pregnancy and Breastfeeding  Marijuana is the dried leaves, flowers, and stems of the Cannabis sativa or Cannabis indica plant. The plant's active ingredients (cannabinoids), including a chemical called THC, change the chemistry of the brain. Marijuana smoke also has many of the same chemicals as cigarette smoke that cause breathing problems. Marijuana gets into your blood through your lungs when you smoke it and through your digestive system when you swallow it. Using marijuana in any form may be harmful for you and your baby when you are trying to become pregnant and during pregnancy. This includes marijuana that is prescribed to you by a health care provider (medical marijuana). Once marijuana is in your blood, it can travel through your placenta to your baby. It may also pass through breast milk. How does this affect me? Marijuana affects you both mentally and physically. Using marijuana can make you feel high and relaxed. It can also have negative effects, especially at high doses or with long-term use. These include:  Rapid heartbeat and stress on your heart.  Lung irritation and breathing problems.  Difficulty thinking and making decisions.  Seeing or believing things that are not true (hallucinations and paranoia).  Mood swings, depression, or anxiety.  Decreased ability to learn and remember.  Difficulty getting pregnant. Marijuana can also affect your pregnancy. Not all the effects are known. However, if you use marijuana during pregnancy, you may:  Be less likely to get regular prenatal care and do the things that you need to do to have a healthy pregnancy.  Be more likely to use other drugs that can harm your pregnancy, like drinking alcohol and smoking cigarettes.  Be at higher risk of having your baby die after 28 weeks of pregnancy (stillbirth).  Be at higher risk of giving birth before 37 weeks of pregnancy (premature birth). How does this affect my  baby? If you use marijuana during pregnancy, this may affect your baby's development, birth, and life after birth. Your baby may:  Be born prematurely, which can cause physical and mental problems.  Be born with a low birth weight, which can lead to physical and mental problems.  Have problems with brain development.  Have difficulty growing.  Have attention and behavior problems later in life.  Do poorly at school and have learning problems later in life.  Have problems with vision and coordination.  Be at higher risk for using marijuana by age 81. More research is needed to find out exactly how marijuana affects a baby during breastfeeding. Some studies suggest that the chemicals in marijuana can be passed to a baby through breast milk. To limit possible risks, you should not use marijuana during breastfeeding. Follow these instructions at home:  Let your health care provider know if you use marijuana before trying to get pregnant, during pregnancy, or during breastfeeding.  Do not use marijuana in any form when you are trying to get pregnant, when you are pregnant, or when you are breastfeeding. If you are having trouble stopping marijuana use, ask your health care provider for help.  Do not smoke. If you need help quitting, ask your health care provider for help.  If you are using medical marijuana, ask your health care provider to switch you to a medicine that is safer to use during pregnancy or breastfeeding.  Keep all your prenatal visits as told by your health care provider. This is important. Where to find more information [employer] on Drug Abuse: www.drugabuse.gov March of Dimes:  www.marchofdimes.org/pregnancy Contact a health care provider if:  You use marijuana and want to get pregnant.  You use marijuana during pregnancy or breastfeeding.  You need help stopping marijuana use. Get help right away if:  Your baby is not gaining weight or growing as  expected. Summary  Using marijuana in any form may be harmful for you and your baby when you are trying to become pregnant, during pregnancy, and during breastfeeding. This includes marijuana that is prescribed to you (medical marijuana).  Some studies suggest that marijuana may pass through breast milk and can affect your baby's brain development.  Talk to your health care provider if you use marijuana in any form while trying to get pregnant, during pregnancy, or while breastfeeding.  Ask your health care provider for help if you are not able to stop using marijuana. This information is not intended to replace advice given to you by your health care provider. Make sure you discuss any questions you have with your health care provider. Document Revised: 05/03/2018 Document Reviewed: 09/27/2016 Elsevier Patient Education  2020 [employer].

## 2020-01-26 LAB — GC/CHLAMYDIA PROBE AMP (~~LOC~~) NOT AT ARMC
Chlamydia: NEGATIVE
Comment: NEGATIVE
Comment: NORMAL
Neisseria Gonorrhea: NEGATIVE

## 2020-01-29 ENCOUNTER — Encounter (HOSPITAL_COMMUNITY): Payer: Self-pay | Admitting: Obstetrics and Gynecology

## 2020-01-29 ENCOUNTER — Inpatient Hospital Stay (HOSPITAL_COMMUNITY)
Admission: AD | Admit: 2020-01-29 | Discharge: 2020-01-29 | Disposition: A | Discharge status: Home / Self Care | Payer: Medicaid Other | Attending: Obstetrics and Gynecology | Admitting: Obstetrics and Gynecology

## 2020-01-29 DIAGNOSIS — O210 Mild hyperemesis gravidarum: Secondary | ICD-10-CM | POA: Diagnosis present

## 2020-01-29 DIAGNOSIS — Z87891 Personal history of nicotine dependence: Secondary | ICD-10-CM | POA: Insufficient documentation

## 2020-01-29 DIAGNOSIS — O99322 Drug use complicating pregnancy, second trimester: Secondary | ICD-10-CM | POA: Diagnosis not present

## 2020-01-29 DIAGNOSIS — Z3A22 22 weeks gestation of pregnancy: Secondary | ICD-10-CM | POA: Diagnosis not present

## 2020-01-29 DIAGNOSIS — F12188 Cannabis abuse with other cannabis-induced disorder: Secondary | ICD-10-CM | POA: Insufficient documentation

## 2020-01-29 DIAGNOSIS — Z7982 Long term (current) use of aspirin: Secondary | ICD-10-CM | POA: Diagnosis not present

## 2020-01-29 DIAGNOSIS — R112 Nausea with vomiting, unspecified: Secondary | ICD-10-CM | POA: Diagnosis not present

## 2020-01-29 DIAGNOSIS — F1290 Cannabis use, unspecified, uncomplicated: Secondary | ICD-10-CM

## 2020-01-29 LAB — URINALYSIS, ROUTINE W REFLEX MICROSCOPIC
Bilirubin Urine: NEGATIVE
Glucose, UA: 50 mg/dL — AB
Hgb urine dipstick: NEGATIVE
Ketones, ur: 5 mg/dL — AB
Leukocytes,Ua: NEGATIVE
Nitrite: NEGATIVE
Protein, ur: NEGATIVE mg/dL
Specific Gravity, Urine: 1.014 (ref 1.005–1.030)
pH: 8 (ref 5.0–8.0)

## 2020-01-29 LAB — CBC
HCT: 35.6 % — ABNORMAL LOW (ref 36.0–46.0)
Hemoglobin: 12 g/dL (ref 12.0–15.0)
MCH: 27.4 pg (ref 26.0–34.0)
MCHC: 33.7 g/dL (ref 30.0–36.0)
MCV: 81.3 fL (ref 80.0–100.0)
Platelets: 199 10*3/uL (ref 150–400)
RBC: 4.38 MIL/uL (ref 3.87–5.11)
RDW: 14.3 % (ref 11.5–15.5)
WBC: 15 10*3/uL — ABNORMAL HIGH (ref 4.0–10.5)
nRBC: 0 % (ref 0.0–0.2)

## 2020-01-29 LAB — COMPREHENSIVE METABOLIC PANEL
ALT: 16 U/L (ref 0–44)
AST: 23 U/L (ref 15–41)
Albumin: 3 g/dL — ABNORMAL LOW (ref 3.5–5.0)
Alkaline Phosphatase: 47 U/L (ref 38–126)
Anion gap: 9 (ref 5–15)
BUN: <5 mg/dL — ABNORMAL LOW (ref 6–20)
CO2: 19 mmol/L — ABNORMAL LOW (ref 22–32)
Calcium: 8.6 mg/dL — ABNORMAL LOW (ref 8.9–10.3)
Chloride: 103 mmol/L (ref 98–111)
Creatinine, Ser: 0.52 mg/dL (ref 0.44–1.00)
GFR, Estimated: >60 mL/min (ref >60)
Glucose, Bld: 130 mg/dL — ABNORMAL HIGH (ref 70–99)
Potassium: 4.2 mmol/L (ref 3.5–5.1)
Sodium: 131 mmol/L — ABNORMAL LOW (ref 135–145)
Total Bilirubin: 0.4 mg/dL (ref 0.3–1.2)
Total Protein: 6.8 g/dL (ref 6.5–8.1)

## 2020-01-29 LAB — LIPASE, BLOOD: Lipase: 19 U/L (ref 11–51)

## 2020-01-29 MED ORDER — LIDOCAINE VISCOUS HCL 2 % MT SOLN
15.0000 mL | Freq: Once | OROMUCOSAL | Status: AC
  Administered 2020-01-29: 15 mL via OROMUCOSAL
  Filled 2020-01-29: qty 15

## 2020-01-29 MED ORDER — LACTATED RINGERS IV BOLUS
1000.0000 mL | Freq: Once | INTRAVENOUS | Status: AC
  Administered 2020-01-29: 1000 mL via INTRAVENOUS

## 2020-01-29 MED ORDER — METOCLOPRAMIDE HCL 10 MG PO TABS: 10.0000 mg | ORAL_TABLET | Freq: Four times a day (QID) | ORAL | 2 refills | Status: DC

## 2020-01-29 MED ORDER — PROMETHAZINE HCL 25 MG/ML IJ SOLN
12.5000 mg | INTRAMUSCULAR | Status: DC | PRN
  Administered 2020-01-29: 12.5 mg via INTRAVENOUS
  Filled 2020-01-29: qty 1

## 2020-01-29 MED ORDER — SCOPOLAMINE 1 MG/3DAYS TD PT72
1.0000 | MEDICATED_PATCH | TRANSDERMAL | Status: DC
  Administered 2020-01-29: 1.5 mg via TRANSDERMAL
  Filled 2020-01-29: qty 1

## 2020-01-29 MED ORDER — PANTOPRAZOLE SODIUM 40 MG IV SOLR
40.0000 mg | Freq: Once | INTRAVENOUS | Status: AC
  Administered 2020-01-29: 40 mg via INTRAVENOUS
  Filled 2020-01-29: qty 40

## 2020-01-29 MED ORDER — SODIUM CHLORIDE 0.9 % IV SOLN
8.0000 mg | Freq: Three times a day (TID) | INTRAVENOUS | Status: DC
  Administered 2020-01-29: 8 mg via INTRAVENOUS
  Filled 2020-01-29 (×3): qty 4

## 2020-01-29 MED ORDER — PROMETHAZINE HCL 25 MG PO TABS: 25.0000 mg | ORAL_TABLET | Freq: Four times a day (QID) | ORAL | 2 refills | Status: DC | PRN

## 2020-01-29 MED ORDER — FAMOTIDINE 20 MG PO TABS: 20.0000 mg | ORAL_TABLET | Freq: Two times a day (BID) | ORAL | 2 refills | Status: DC

## 2020-01-29 MED ORDER — ONDANSETRON 4 MG PO TBDP: 4.0000 mg | ORAL_TABLET | Freq: Four times a day (QID) | ORAL | 3 refills | Status: DC | PRN

## 2020-01-29 MED ORDER — HALOPERIDOL LACTATE 5 MG/ML IJ SOLN
1.0000 mg | Freq: Once | INTRAMUSCULAR | Status: AC
  Administered 2020-01-29: 1 mg via INTRAMUSCULAR
  Filled 2020-01-29: qty 0.2

## 2020-01-29 NOTE — Discharge Instructions (Signed)
Safe Medications in Pregnancy   Acne: Benzoyl Peroxide Salicylic Acid  Backache/Headache: Tylenol: 2 regular strength every 4 hours OR              2 Extra strength every 6 hours  Colds/Coughs/Allergies: Benadryl (alcohol free) 25 mg every 6 hours as needed Breath right strips Claritin Cepacol throat lozenges Chloraseptic throat spray Cold-Eeze- up to three times per day Cough drops, alcohol free Flonase (by prescription only) Guaifenesin Mucinex Robitussin DM (plain only, alcohol free) Saline nasal spray/drops Sudafed (pseudoephedrine) & Actifed ** use only after [redacted] weeks gestation and if you do not have high blood pressure Tylenol Vicks Vaporub Zinc lozenges Zyrtec   Constipation: Colace Ducolax suppositories Fleet enema Glycerin suppositories Metamucil Milk of magnesia Miralax Senokot Smooth move tea  Diarrhea: Kaopectate Imodium A-D  *NO pepto Bismol  Hemorrhoids: Anusol Anusol HC Preparation H Tucks  Indigestion: Tums Maalox Mylanta Zantac  Pepcid  Insomnia: Benadryl (alcohol free) 25mg  every 6 hours as needed Tylenol PM Unisom, no Gelcaps  Leg Cramps: Tums MagGel  Nausea/Vomiting:  Bonine Dramamine Emetrol Ginger extract Sea bands Meclizine  Nausea medication to take during pregnancy:  Unisom (doxylamine succinate 25 mg tablets) Take one tablet daily at bedtime. If symptoms are not adequately controlled, the dose can be increased to a maximum recommended dose of two tablets daily (1/2 tablet in the morning, 1/2 tablet mid-afternoon and one at bedtime). Vitamin B6 100mg  tablets. Take one tablet twice a day (up to 200 mg per day).  Skin Rashes: Aveeno products Benadryl cream or 25mg  every 6 hours as needed Calamine Lotion 1% cortisone cream  Yeast infection: Gyne-lotrimin 7 Monistat 7   **If taking multiple medications, please check labels to avoid duplicating the same active ingredients **take medication as directed on  the label ** Do not exceed 4000 mg of tylenol in 24 hours **Do not take medications that contain aspirin or ibuprofen     Hyperemesis Gravidarum Hyperemesis gravidarum is a severe form of nausea and vomiting that happens during pregnancy. Hyperemesis is worse than morning sickness. It may cause you to have nausea or vomiting all day for many days. It may keep you from eating and drinking enough food and liquids, which can lead to dehydration, malnutrition, and weight loss. Hyperemesis usually occurs during the first half (the first 20 weeks) of pregnancy. It often goes away once a woman is in her second half of pregnancy. However, sometimes hyperemesis continues through an entire pregnancy. What are the causes? The cause of this condition is not known. It may be related to changes in chemicals (hormones) in the body during pregnancy, such as the high level of pregnancy hormone (human chorionic gonadotropin) or the increase in the female sex hormone (estrogen). What are the signs or symptoms? Symptoms of this condition include:  Nausea that does not go away.  Vomiting that does not allow you to keep any food down.  Weight loss.  Body fluid loss (dehydration).  Having no desire to eat, or not liking food that you have previously enjoyed. How is this diagnosed? This condition may be diagnosed based on:  A physical exam.  Your medical history.  Your symptoms.  Blood tests.  Urine tests. How is this treated? This condition is managed by controlling symptoms. This may include:  Following an eating plan. This can help lessen nausea and vomiting.  Taking prescription medicines. An eating plan and medicines are often used together to help control symptoms. If medicines do not help relieve  nausea and vomiting, you may need to receive fluids through an IV at the hospital. Follow these instructions at home: Eating and drinking   Avoid the following: ? Drinking fluids with meals.  Try not to drink anything during the 30 minutes before and after your meals. ? Drinking more than 1 cup of fluid at a time. ? Eating foods that trigger your symptoms. These may include spicy foods, coffee, high-fat foods, very sweet foods, and acidic foods. ? Skipping meals. Nausea can be more intense on an empty stomach. If you cannot tolerate food, do not force it. Try sucking on ice chips or other frozen items and make up for missed calories later. ? Lying down within 2 hours after eating. ? Being exposed to environmental triggers. These may include food smells, smoky rooms, closed spaces, rooms with strong smells, warm or humid places, overly loud and noisy rooms, and rooms with motion or flickering lights. Try eating meals in a well-ventilated area that is free of strong smells. ? Quick and sudden changes in your movement. ? Taking iron pills and multivitamins that contain iron. If you take prescription iron pills, do not stop taking them unless your health care provider approves. ? Preparing food. The smell of food can spoil your appetite or trigger nausea.  To help relieve your symptoms: ? Listen to your body. Everyone is different and has different preferences. Find what works best for you. ? Eat and drink slowly. ? Eat 5-6 small meals daily instead of 3 large meals. Eating small meals and snacks can help you avoid an empty stomach. ? In the morning, before getting out of bed, eat a couple of crackers to avoid moving around on an empty stomach. ? Try eating starchy foods as these are usually tolerated well. Examples include cereal, toast, bread, potatoes, pasta, rice, and pretzels. ? Include at least 1 serving of protein with your meals and snacks. Protein options include lean meats, poultry, seafood, beans, nuts, nut butters, eggs, cheese, and yogurt. ? Try eating a protein-rich snack before bed. Examples of a protein-rick snack include cheese and crackers or a peanut butter sandwich made  with 1 slice of whole-wheat bread and 1 tsp (5 g) of peanut butter. ? Eat or suck on things that have ginger in them. It may help relieve nausea. Add  tsp ground ginger to hot tea or choose ginger tea. ? Try drinking 100% fruit juice or an electrolyte drink. An electrolyte drink contains sodium, potassium, and chloride. ? Drink fluids that are cold, clear, and carbonated or sour. Examples include lemonade, ginger ale, lemon-lime soda, ice water, and sparkling water. ? Brush your teeth or use a mouth rinse after meals. ? Talk with your health care provider about starting a supplement of vitamin B6. General instructions  Take over-the-counter and prescription medicines only as told by your health care provider.  Follow instructions from your health care provider about eating or drinking restrictions.  Continue to take your prenatal vitamins as told by your health care provider. If you are having trouble taking your prenatal vitamins, talk with your health care provider about different options.  Keep all follow-up and pre-birth (prenatal) visits as told by your health care provider. This is important. Contact a health care provider if:  You have pain in your abdomen.  You have a severe headache.  You have vision problems.  You are losing weight.  You feel weak or dizzy. Get help right away if:  You cannot drink fluids  without vomiting.  You vomit blood.  You have constant nausea and vomiting.  You are very weak.  You faint.  You have a fever and your symptoms suddenly get worse. Summary  Hyperemesis gravidarum is a severe form of nausea and vomiting that happens during pregnancy.  Making some changes to your eating habits may help relieve nausea and vomiting.  This condition may be managed with medicine.  If medicines do not help relieve nausea and vomiting, you may need to receive fluids through an IV at the hospital. This information is not intended to replace advice  given to you by your health care provider. Make sure you discuss any questions you have with your health care provider. Document Revised: 01/29/2017 Document Reviewed: 09/08/2015 Elsevier Patient Education  2020 [employer].

## 2020-01-29 NOTE — MAU Note (Signed)
p tstated she started having lower abd pain and cramping yesterday. C/o pink vaginal discharge today. Having Nausea and vomiting.

## 2020-01-29 NOTE — MAU Provider Note (Addendum)
History     [id number]  Arrival date and time: 01/29/20 1615    Chief Complaint  Patient presents with  . Abdominal Pain  . Vaginal Bleeding     HPI Jacqueline Shepherd is a 30 y.o. at [redacted]w[redacted]d by 6wk [place] with PMHx notable for hyperemesis, who presents for nausea and vomiting.   Patient last seen in MAU on 01/25/2020 for abdominal pain and n/v At that time reporting regular MJ use Jacqueline Shepherd was treated with LR bolus, haldol, decadron, zofran with improvement in symptoms  Today reports Jacqueline Shepherd has been unable to eat or drink anything for two days and has had constant nausea and vomiting Also has abdominal pain due to wretching No vaginal bleeding or loss of fluid No contractions, baby moving normally State that Jacqueline Shepherd ran out of the medicines Jacqueline Shepherd was prescribed four days prior Denies any MJ use in the past week   B/Positive/-- (10/19 1355)  OB History    Gravida  2   Para      Term      Preterm      AB  1   Living        SAB  1   IAB      Ectopic      Multiple      Live Births              Past Medical History:  Diagnosis Date  . Abdominal pain   . Cannabinoid hyperemesis syndrome   . Chlamydia infection complicating pregnancy in second trimester 08/18/2018   Negative test of cure on 08/06/18  . Transient hypertension of pregnancy 10/12/2019    Past Surgical History:  Procedure Laterality Date  . WISDOM TOOTH EXTRACTION      Family History  Problem Relation Age of Onset  . Diabetes Mother   . Hypertension Mother     Social History   Socioeconomic History  . Marital status: Single    Spouse name: Not on file  . Number of children: Not on file  . Years of education: Not on file  . Highest education level: Not on file  Occupational History  . Not on file  Tobacco Use  . Smoking status: Former Smoker    Packs/day: 0.50    Quit date: 01/22/2020    Years since quitting: 0.0  . Smokeless tobacco: Never Used  Vaping Use  . Vaping Use: Never used   Substance and Sexual Activity  . Alcohol use: No  . Drug use: Not on file    Comment: 01/22/2020  . Sexual activity: Not Currently    Partners: Male    Birth control/protection: None    Comment: Pregnant   Other Topics Concern  . Not on file  Social History Narrative  . Not on file   Social Determinants of Health   Financial Resource Strain: Not on file  Food Insecurity: Not on file  Transportation Needs: Not on file  Physical Activity: Not on file  Stress: Not on file  Social Connections: Not on file  Intimate Partner Violence: Not on file    No Known Allergies  No current facility-administered medications on file prior to encounter.   Current Outpatient Medications on File Prior to Encounter  Medication Sig Dispense Refill  . Prenatal Vit-Fe Phos-FA-Omega (VITAFOL GUMMIES) 3.33-0.333-34.8 MG CHEW Chew 3 each by mouth daily. 90 tablet 3  . aspirin EC 81 MG tablet Take 1 tablet (81 mg total) by mouth daily. Swallow whole. 30 tablet  11  . Blood Pressure Monitor KIT 1 Device by Does not apply route once a week. To be monitored Regularly at home. 1 kit 0  . ondansetron (ZOFRAN ODT) 4 MG disintegrating tablet Take 1 tablet (4 mg total) by mouth every 6 (six) hours as needed for nausea. 20 tablet 0  . Prenatal Vit-Fe Fumarate-FA (PREPLUS) 27-1 MG TABS Take 1 tablet by mouth daily. 30 tablet 13  . promethazine (PHENERGAN) 25 MG tablet Take 1 tablet (25 mg total) by mouth every 6 (six) hours as needed for nausea or vomiting. 30 tablet 2  . scopolamine (TRANSDERM-SCOP) 1 MG/3DAYS Place 1 patch (1.5 mg total) onto the skin every 3 (three) days. 10 patch 12  . [DISCONTINUED] fluticasone (FLONASE) 50 MCG/ACT nasal spray Place 1 spray into both nostrils daily. (Patient not taking: Reported on 07/03/2019) 11.1 mL 0     ROS Pertinent positives and negative per HPI, all others reviewed and negative  Physical Exam   Temp 98.2 F (36.8 C)   LMP 09/17/2019   Physical Exam Vitals  reviewed.  Constitutional:      General: Jacqueline Shepherd is not in acute distress.    Appearance: Jacqueline Shepherd is well-developed and well-nourished. Jacqueline Shepherd is not diaphoretic.  Eyes:     General: No scleral icterus. Pulmonary:     Effort: Pulmonary effort is normal. No respiratory distress.  Abdominal:     General: There is no distension.  Musculoskeletal:        General: No edema.  Skin:    General: Skin is warm and dry.  Neurological:     Mental Status: Jacqueline Shepherd is alert.     Coordination: Coordination normal.  Psychiatric:        Mood and Affect: Mood and affect normal.     FHT FHR 145 bpm  Labs Results for orders placed or performed during the hospital encounter of 01/29/20 (from the past 24 hour(s))  Urinalysis, Routine w reflex microscopic Urine, Clean Catch     Status: Abnormal   Collection Time: 01/29/20  7:54 PM  Result Value Ref Range   Color, Urine YELLOW YELLOW   APPearance TURBID (A) CLEAR   Specific Gravity, Urine 1.014 1.005 - 1.030   pH 8.0 5.0 - 8.0   Glucose, UA 50 (A) NEGATIVE mg/dL   Hgb urine dipstick NEGATIVE NEGATIVE   Bilirubin Urine NEGATIVE NEGATIVE   Ketones, ur 5 (A) NEGATIVE mg/dL   Protein, ur NEGATIVE NEGATIVE mg/dL   Nitrite NEGATIVE NEGATIVE   Leukocytes,Ua NEGATIVE NEGATIVE   RBC / HPF 0-5 0 - 5 RBC/hpf   WBC, UA 0-5 0 - 5 WBC/hpf   Bacteria, UA RARE (A) NONE SEEN   Squamous Epithelial / LPF 0-5 0 - 5   Amorphous Crystal PRESENT   CBC     Status: Abnormal   Collection Time: 01/29/20  8:51 PM  Result Value Ref Range   WBC 15.0 (H) 4.0 - 10.5 K/uL   RBC 4.38 3.87 - 5.11 MIL/uL   Hemoglobin 12.0 12.0 - 15.0 g/dL   HCT 35.6 (L) 36.0 - 46.0 %   MCV 81.3 80.0 - 100.0 fL   MCH 27.4 26.0 - 34.0 pg   MCHC 33.7 30.0 - 36.0 g/dL   RDW 14.3 11.5 - 15.5 %   Platelets 199 150 - 400 K/uL   nRBC 0.0 0.0 - 0.2 %    Imaging No results found.  MAU Course  Procedures  Lab Orders     CBC  Comprehensive metabolic panel     Lipase, blood     Urinalysis, Routine  w reflex microscopic Urine, Clean Catch Meds ordered this encounter  Medications  . scopolamine (TRANSDERM-SCOP) 1 MG/3DAYS 1.5 mg  . haloperidol lactate (HALDOL) injection 1 mg  . lactated ringers bolus 1,000 mL  . lidocaine (XYLOCAINE) 2 % viscous mouth solution 15 mL  . ondansetron (ZOFRAN) 8 mg in sodium chloride 0.9 % 50 mL IVPB  . promethazine (PHENERGAN) injection 12.5 mg  . pantoprazole (PROTONIX) injection 40 mg   Imaging Orders  No imaging studies ordered today    MDM moderate  Assessment and Plan  #Hyperemesis gravidarum Ongoing symptoms in absence of home medications, will treat symptomatically as above, see if Jacqueline Shepherd can tolerate PO challenge once symptoms are improved. Check labs to ensure no significant metabolic abnormalities.  Signed out to Len Blalock CNM at change of shift.   #FWB Fetal doppler normal  Clarnce Flock  Patient resting comfortably in bed. Has not had any episodes of vomiting since antiemetics were administered. Discussed with patient that the goal of treatment is not to eliminate vomiting but to make it fewer times and more manageable. Refills of all medications sent to pharmacy and new prescriptions for pepcid and reglan sent. Patient still reporting pain but resting comfortably, talking on phone and able to ambulate without difficulty. Discussed importance of marajuana cessation.   1. Cannabinoid hyperemesis syndrome   2. [redacted] weeks gestation of pregnancy    -Discharge home in stable condition -Rx for reglan and pepcid sent to pharmacy -Second trimester precautions discussed -Patient advised to follow-up with OB as scheduled for prenatal care -Patient may return to MAU as needed or if her condition were to change or worsen   Wende Mott, CNM 01/29/20 10:47 PM

## 2020-01-30 ENCOUNTER — Inpatient Hospital Stay (HOSPITAL_COMMUNITY)
Admission: AD | Admit: 2020-01-30 | Discharge: 2020-01-31 | Disposition: A | Discharge status: Home / Self Care | Payer: Medicaid Other | Attending: Obstetrics & Gynecology | Admitting: Obstetrics & Gynecology

## 2020-01-30 ENCOUNTER — Encounter (HOSPITAL_COMMUNITY): Payer: Self-pay | Admitting: Obstetrics & Gynecology

## 2020-01-30 DIAGNOSIS — Z87891 Personal history of nicotine dependence: Secondary | ICD-10-CM | POA: Insufficient documentation

## 2020-01-30 DIAGNOSIS — D563 Thalassemia minor: Secondary | ICD-10-CM

## 2020-01-30 DIAGNOSIS — R109 Unspecified abdominal pain: Secondary | ICD-10-CM | POA: Diagnosis not present

## 2020-01-30 DIAGNOSIS — O0990 Supervision of high risk pregnancy, unspecified, unspecified trimester: Secondary | ICD-10-CM

## 2020-01-30 DIAGNOSIS — O210 Mild hyperemesis gravidarum: Secondary | ICD-10-CM

## 2020-01-30 DIAGNOSIS — O212 Late vomiting of pregnancy: Secondary | ICD-10-CM | POA: Insufficient documentation

## 2020-01-30 DIAGNOSIS — Z3A22 22 weeks gestation of pregnancy: Secondary | ICD-10-CM

## 2020-01-30 DIAGNOSIS — O26892 Other specified pregnancy related conditions, second trimester: Secondary | ICD-10-CM | POA: Insufficient documentation

## 2020-01-30 LAB — URINALYSIS, COMPLETE (UACMP) WITH MICROSCOPIC
Bilirubin Urine: NEGATIVE
Glucose, UA: 50 mg/dL — AB
Hgb urine dipstick: NEGATIVE
Ketones, ur: 20 mg/dL — AB
Nitrite: NEGATIVE
Protein, ur: 30 mg/dL — AB
Specific Gravity, Urine: 1.021 (ref 1.005–1.030)
pH: 6 (ref 5.0–8.0)

## 2020-01-30 MED ORDER — PROMETHAZINE HCL 25 MG RE SUPP
25.0000 mg | Freq: Once | RECTAL | Status: AC
  Administered 2020-01-30: 25 mg via RECTAL
  Filled 2020-01-30: qty 1

## 2020-01-30 NOTE — MAU Provider Note (Incomplete Revision)
Patient Jacqueline Shepherd is a [id number]  at [redacted]w[redacted]d here with complaints of on-going nausea and vomiting. She denies vaginal bleeding or contractions. She was seen in MAU for nausea and vomiting on 1/2 and 1/6; she was instructed to take phenergan, reglan and zofran at home. She states that she took all 3 pills twice today and she still feels nauseated.   History     CSN: [id number]  Arrival date and time: 01/30/20 2107   None     No chief complaint on file.  Emesis  This is a chronic problem. The current episode started in the past 7 days. The problem occurs 5 to 10 times per day. The problem has been unchanged. There has been no fever. Associated symptoms include abdominal pain.    OB History    Gravida  2   Para      Term      Preterm      AB  1   Living        SAB  1   IAB      Ectopic      Multiple      Live Births              Past Medical History:  Diagnosis Date  . Abdominal pain   . Cannabinoid hyperemesis syndrome   . Chlamydia infection complicating pregnancy in second trimester 08/18/2018   Negative test of cure on 08/06/18  . Transient hypertension of pregnancy 10/12/2019    Past Surgical History:  Procedure Laterality Date  . WISDOM TOOTH EXTRACTION      Family History  Problem Relation Age of Onset  . Diabetes Mother   . Hypertension Mother     Social History   Tobacco Use  . Smoking status: Former Smoker    Packs/day: 0.50    Quit date: 01/22/2020    Years since quitting: 0.0  . Smokeless tobacco: Never Used  Vaping Use  . Vaping Use: Never used  Substance Use Topics  . Alcohol use: No    Allergies: No Known Allergies  Medications Prior to Admission  Medication Sig Dispense Refill Last Dose  . aspirin EC 81 MG tablet Take 1 tablet (81 mg total) by mouth daily. Swallow whole. 30 tablet 11   . Blood Pressure Monitor KIT 1 Device by Does not apply route once a week. To be monitored Regularly at home. 1 kit 0   . famotidine  (PEPCID) 20 MG tablet Take 1 tablet (20 mg total) by mouth 2 (two) times daily. 30 tablet 2   . metoCLOPramide (REGLAN) 10 MG tablet Take 1 tablet (10 mg total) by mouth every 6 (six) hours. 30 tablet 2   . ondansetron (ZOFRAN ODT) 4 MG disintegrating tablet Take 1 tablet (4 mg total) by mouth every 6 (six) hours as needed for nausea. 30 tablet 3   . Prenatal Vit-Fe Fumarate-FA (PREPLUS) 27-1 MG TABS Take 1 tablet by mouth daily. 30 tablet 13   . Prenatal Vit-Fe Phos-FA-Omega (VITAFOL GUMMIES) 3.33-0.333-34.8 MG CHEW Chew 3 each by mouth daily. 90 tablet 3   . promethazine (PHENERGAN) 25 MG tablet Take 1 tablet (25 mg total) by mouth every 6 (six) hours as needed for nausea or vomiting. 30 tablet 2   . scopolamine (TRANSDERM-SCOP) 1 MG/3DAYS Place 1 patch (1.5 mg total) onto the skin every 3 (three) days. 10 patch 12     Review of Systems  Gastrointestinal: Positive for abdominal pain and vomiting.  Physical Exam   Last menstrual period 09/17/2019.  Physical Exam  MAU Course  Procedures Results for orders placed or performed during the hospital encounter of 01/30/20 (from the past 24 hour(s))  Urinalysis, Complete w Microscopic Urine, Clean Catch     Status: Abnormal   Collection Time: 01/30/20 10:13 PM  Result Value Ref Range   Color, Urine YELLOW YELLOW   APPearance CLOUDY (A) CLEAR   Specific Gravity, Urine 1.021 1.005 - 1.030   pH 6.0 5.0 - 8.0   Glucose, UA 50 (A) NEGATIVE mg/dL   Hgb urine dipstick NEGATIVE NEGATIVE   Bilirubin Urine NEGATIVE NEGATIVE   Ketones, ur 20 (A) NEGATIVE mg/dL   Protein, ur 30 (A) NEGATIVE mg/dL   Nitrite NEGATIVE NEGATIVE   Leukocytes,Ua MODERATE (A) NEGATIVE   RBC / HPF 11-20 0 - 5 RBC/hpf   WBC, UA 21-50 0 - 5 WBC/hpf   Bacteria, UA FEW (A) NONE SEEN   Squamous Epithelial / LPF 21-50 0 - 5   Mucus PRESENT    Trichomonas, UA PRESENT (A) NONE SEEN    MDM -UA  -repeat labs  Assessment and Plan    Mervyn Skeeters Kooistra 01/30/2020,  9:53 PM   Reassessment (11:27 PM) Provider to bedside. Patient endorses HPI as above. Also reports abdominal pain Informed of results of UA and need for treatment. Patient reports she had 2 incidents of vomiting since arrival. Patient reports last dosing of medications was 4 hours ago. She reports last MJ intake was 2 weeks. Discussed how symptoms should improve within 4-6 weeks after smoking cessation.  Patient agreeable to rectal medications. She reports that everything she drinks she throws up. However, patient requests IV medications

## 2020-01-30 NOTE — MAU Note (Signed)
PT ARRIVED VIA EMS - WAS HERE LAST NIGHT - GAVE MEDS.  SAYS TOOK MEDS TODAY - BUT SAYS HASN'T EATEN - LAST TIME SHE ATE WAS Thursday .

## 2020-01-30 NOTE — MAU Provider Note (Addendum)
Patient Tonimarie LATICIA VANNOSTRAND is a G2P0010  at [redacted]w[redacted]d here with complaints of on-going nausea and vomiting. She denies vaginal bleeding or contractions. She was seen in MAU for nausea and vomiting on 1/2 and 1/6; she was instructed to take phenergan, reglan and zofran at home. She states that she took all 3 pills twice today and she still feels nauseated.   History     CSN: [id number]  Arrival date and time: 01/30/20 2107   None     No chief complaint on file.  Emesis  This is a chronic problem. The current episode started in the past 7 days. The problem occurs 5 to 10 times per day. The problem has been unchanged. There has been no fever. Associated symptoms include abdominal pain.    OB History    Gravida  2   Para      Term      Preterm      AB  1   Living        SAB  1   IAB      Ectopic      Multiple      Live Births              Past Medical History:  Diagnosis Date   Abdominal pain    Cannabinoid hyperemesis syndrome    Chlamydia infection complicating pregnancy in second trimester 08/18/2018   Negative test of cure on 08/06/18   Transient hypertension of pregnancy 10/12/2019    Past Surgical History:  Procedure Laterality Date   WISDOM TOOTH EXTRACTION      Family History  Problem Relation Age of Onset   Diabetes Mother    Hypertension Mother     Social History   Tobacco Use   Smoking status: Former Smoker    Packs/day: 0.50    Quit date: 01/22/2020    Years since quitting: 0.0   Smokeless tobacco: Never Used  Vaping Use   Vaping Use: Never used  Substance Use Topics   Alcohol use: No    Allergies: No Known Allergies  Medications Prior to Admission  Medication Sig Dispense Refill Last Dose   aspirin EC 81 MG tablet Take 1 tablet (81 mg total) by mouth daily. Swallow whole. 30 tablet 11    Blood Pressure Monitor KIT 1 Device by Does not apply route once a week. To be monitored Regularly at home. 1 kit 0    famotidine  (PEPCID) 20 MG tablet Take 1 tablet (20 mg total) by mouth 2 (two) times daily. 30 tablet 2    metoCLOPramide (REGLAN) 10 MG tablet Take 1 tablet (10 mg total) by mouth every 6 (six) hours. 30 tablet 2    ondansetron (ZOFRAN ODT) 4 MG disintegrating tablet Take 1 tablet (4 mg total) by mouth every 6 (six) hours as needed for nausea. 30 tablet 3    Prenatal Vit-Fe Fumarate-FA (PREPLUS) 27-1 MG TABS Take 1 tablet by mouth daily. 30 tablet 13    Prenatal Vit-Fe Phos-FA-Omega (VITAFOL GUMMIES) 3.33-0.333-34.8 MG CHEW Chew 3 each by mouth daily. 90 tablet 3    promethazine (PHENERGAN) 25 MG tablet Take 1 tablet (25 mg total) by mouth every 6 (six) hours as needed for nausea or vomiting. 30 tablet 2    scopolamine (TRANSDERM-SCOP) 1 MG/3DAYS Place 1 patch (1.5 mg total) onto the skin every 3 (three) days. 10 patch 12     Review of Systems  Gastrointestinal: Positive for abdominal pain and vomiting.  Physical Exam   Last menstrual period 09/17/2019.  Physical Exam Constitutional:      Appearance: Normal appearance.  HENT:     Head: Normocephalic and atraumatic.  Eyes:     Conjunctiva/sclera: Conjunctivae normal.  Cardiovascular:     Rate and Rhythm: Normal rate and regular rhythm.     Heart sounds: Normal heart sounds.  Pulmonary:     Effort: Pulmonary effort is normal. No respiratory distress.     Breath sounds: Normal breath sounds.  Abdominal:     General: Bowel sounds are normal.     Palpations: Abdomen is soft.     Tenderness: There is abdominal tenderness in the epigastric area.  Musculoskeletal:        General: Normal range of motion.     Cervical back: Normal range of motion.  Skin:    General: Skin is warm and dry.  Neurological:     Mental Status: [name] alert and oriented to person, place, and time.  Psychiatric:        Mood and Affect: Mood normal.        Behavior: Behavior normal.        Thought Content: Thought content normal.     MAU Course   Procedures Results for orders placed or performed during the hospital encounter of 01/30/20 (from the past 24 hour(s))  Urinalysis, Complete w Microscopic Urine, Clean Catch     Status: Abnormal   Collection Time: 01/30/20 10:13 PM  Result Value Ref Range   Color, Urine YELLOW YELLOW   APPearance CLOUDY (A) CLEAR   Specific Gravity, Urine 1.021 1.005 - 1.030   pH 6.0 5.0 - 8.0   Glucose, UA 50 (A) NEGATIVE mg/dL   Hgb urine dipstick NEGATIVE NEGATIVE   Bilirubin Urine NEGATIVE NEGATIVE   Ketones, ur 20 (A) NEGATIVE mg/dL   Protein, ur 30 (A) NEGATIVE mg/dL   Nitrite NEGATIVE NEGATIVE   Leukocytes,Ua MODERATE (A) NEGATIVE   RBC / HPF 11-20 0 - 5 RBC/hpf   WBC, UA 21-50 0 - 5 WBC/hpf   Bacteria, UA FEW (A) NONE SEEN   Squamous Epithelial / LPF 21-50 0 - 5   Mucus PRESENT    Trichomonas, UA PRESENT (A) NONE SEEN    MDM -UA  -repeat labs  Assessment and Plan    Mervyn Skeeters [facility] 01/30/2020, 9:53 PM   Reassessment (11:27 PM) -Provider to bedside. -Patient endorses HPI as above. -Also reports abdominal pain -Informed of results of UA and need for treatment. -Patient reports she had 2 incidents of vomiting since arrival. -Patient reports last dosing of medications was 4 hours ago. -She reports last MJ intake was 2 weeks. Discussed how symptoms should improve within 4-6 weeks after smoking cessation.  -Patient agreeable to rectal medications. She reports that everything she drinks she throws up. However, patient requests IV medications  Reassessment (2:33 AM) -Patient reports continued nausea, but no vomiting. -Will give zofran ODT and reassess. -Patient requesting and given apple juice.   Reassessment (3:35AM) -Patient reports abdominal pain of 10/10 that she describes as "rock on my stomach." -Assessment with tenderness noted in epigastric area.  -Will give bentyl $RemoveBef'20mg'[id number]$  IV.   Reassessment (5:20 AM) -Patient reports continued abdominal pain despite bentyl  dosing. -Patient reports she can tolerate oral medications and is agreeable to medication.  -Patient requesting for treatment of trichomoniasis today despite provider concern for emesis. -Patient states she is unsure if the nausea has improved. Agreeable to additional dosing of phenergan PR. -  NT to provider and reports patient with emesis. -Provider to bedside.  Patient reports emesis, but requesting apple juice and sprite. -Will give phenergan and morphine IM for pain. -Informed that will send home with Flagyl script to completion of treatment with cessation of nausea/vomiting. -Discussed management of symptoms at home.  Rx for phenergan suppository sent to pharmacy on file.  -Encouraged to follow up with primary office as scheduled. -Okay for [place] excuse for today and tomorrow.  -Encouraged to call or return to MAU if symptoms worsen or with the onset of new symptoms. -Discharged to home in stable condition.  Maryann Conners MSN, CNM Advanced Practice Provider, [facility]

## 2020-01-31 MED ORDER — PROMETHAZINE HCL 25 MG RE SUPP
25.0000 mg | Freq: Once | RECTAL | Status: AC
  Administered 2020-01-31: 25 mg via RECTAL
  Filled 2020-01-31: qty 1

## 2020-01-31 MED ORDER — PROMETHAZINE HCL 25 MG RE SUPP: 25.0000 mg | Freq: Four times a day (QID) | RECTAL | 2 refills | Status: DC | PRN

## 2020-01-31 MED ORDER — METRONIDAZOLE 500 MG PO TABS: 2000.0000 mg | ORAL_TABLET | Freq: Once | ORAL | 0 refills | Status: AC

## 2020-01-31 MED ORDER — MORPHINE SULFATE (PF) 4 MG/ML IV SOLN
2.0000 mg | Freq: Once | INTRAVENOUS | Status: AC
  Administered 2020-01-31: 2 mg via INTRAMUSCULAR
  Filled 2020-01-31: qty 1

## 2020-01-31 MED ORDER — DICYCLOMINE HCL 10 MG/ML IM SOLN
20.0000 mg | Freq: Once | INTRAMUSCULAR | Status: AC
  Administered 2020-01-31: 20 mg via INTRAMUSCULAR
  Filled 2020-01-31: qty 2

## 2020-01-31 MED ORDER — ONDANSETRON 4 MG PO TBDP
8.0000 mg | ORAL_TABLET | Freq: Once | ORAL | Status: AC
  Administered 2020-01-31: 8 mg via ORAL
  Filled 2020-01-31: qty 2

## 2020-01-31 MED ORDER — CYCLOBENZAPRINE HCL 5 MG PO TABS: 10.0000 mg | ORAL_TABLET | Freq: Once | ORAL | Status: DC

## 2020-01-31 NOTE — Discharge Instructions (Signed)
Cannabinoid Hyperemesis Syndrome Cannabinoid hyperemesis syndrome (CHS) is a condition that causes repeated nausea, vomiting, and abdominal pain after long-term (chronic) use of marijuana (cannabis). People with CHS typically use marijuana 3-5 times a day for many years before they have symptoms, although it is possible to develop CHS with as little as 1 use per day. Symptoms of CHS may be mild at first but can get worse and more frequent. In some cases, CHS may cause vomiting many times a day, which can lead to weight loss and dehydration. CHS may go away and come back many times (recur). People may not have symptoms or may otherwise be healthy in between CHS attacks. What are the causes? The exact cause of this condition is not known. Long-term use of marijuana may over-stimulate certain proteins in the brain that react with chemicals in marijuana (cannabinoid receptors). This over-stimulation may cause CHS. What are the signs or symptoms? Symptoms of this condition are often mild during the first few attacks, but they can get worse over time. Symptoms may include:  Frequent nausea, especially early in the morning.  Vomiting.  Abdominal pain. Taking several hot showers throughout the day can also be a sign of this condition. People with CHS may do this because it relieves symptoms. How is this diagnosed? This condition may be diagnosed based on:  Your symptoms and medical history, including any drug use.  A physical exam. You may have tests done to rule out other problems. These tests may include:  Blood tests.  Urine tests.  Imaging tests, such as an X-ray or CT scan. How is this treated? Treatment for this condition involves stopping marijuana use. Your health care provider may recommend:  A drug rehabilitation program, if you have trouble stopping marijuana use.  Medicines for nausea.  Hot showers to help relieve symptoms. Certain creams that contain a substance called  capsaicin may improve symptoms when applied to the abdomen. Ask your health care provider before starting any medicines or other treatments. Severe nausea and vomiting may require you to stay at the hospital. You may need IV fluids to prevent or treat dehydration. You may also need certain medicines that must be given at the hospital. Follow these instructions at home: During an attack   Stay in bed and rest in a dark, quiet room.  Take anti-nausea medicine as told by your health care provider.  Try taking hot showers to relieve your symptoms. After an attack  Drink small amounts of clear fluids slowly. Gradually add more.  Once you are able to eat without vomiting, eat soft foods in small amounts every 3-4 hours. General instructions   Do not use any products that contain marijuana.If you need help quitting, ask your health care provider for resources and treatment options.  Drink enough fluid to keep your urine pale yellow. Avoid drinking fluids that have a lot of sugar or caffeine, such as coffee and soda.  Take and apply over-the-counter and prescription medicines only as told by your health care provider. Ask your health care provider before starting any new medicines or treatments.  Keep all follow-up visits as told by your health care provider. This is important. Contact a health care provider if:  Your symptoms get worse.  You cannot drink fluids without vomiting.  You have pain and trouble swallowing after an attack. Get help right away if:  You cannot stop vomiting.  You have blood in your vomit or your vomit looks like coffee grounds.  You have   severe abdominal pain.  You have stools that are bloody or black, or stools that look like tar.  You have symptoms of dehydration, such as: ? Sunken eyes. ? Inability to make tears. ? Cracked lips. ? Dry mouth. ? Decreased urine production. ? Weakness. ? Sleepiness. ? Fainting. Summary  Cannabinoid hyperemesis  syndrome (CHS) is a condition that causes repeated nausea, vomiting, and abdominal pain after long-term use of marijuana.  People with CHS typically use marijuana 3-5 times a day for many years before they have symptoms, although it is possible to develop CHS with as little as 1 use per day.  Treatment for this condition involves stopping marijuana use. Hot showers and capsaicin creams may also help relieve symptoms. Ask your health care provider before starting any medicines or other treatments.  Your health care provider may prescribe medicines to help with nausea.  Get help right away if you have signs of dehydration, such as dry mouth, decreased urine production, or weakness. This information is not intended to replace advice given to you by your health care provider. Make sure you discuss any questions you have with your health care provider. Document Revised: 05/18/2017 Document Reviewed: 04/19/2016 Elsevier Patient Education  2020 [employer]  

## 2020-02-01 LAB — CULTURE, OB URINE

## 2020-02-05 ENCOUNTER — Other Ambulatory Visit (HOSPITAL_COMMUNITY)
Admission: RE | Admit: 2020-02-05 | Discharge: 2020-02-05 | Disposition: A | Discharge status: Home / Self Care | Payer: Medicaid Other | Source: Ambulatory Visit | Attending: Obstetrics and Gynecology | Admitting: Obstetrics and Gynecology

## 2020-02-05 DIAGNOSIS — R300 Dysuria: Secondary | ICD-10-CM | POA: Insufficient documentation

## 2020-02-05 DIAGNOSIS — O0990 Supervision of high risk pregnancy, unspecified, unspecified trimester: Secondary | ICD-10-CM | POA: Insufficient documentation

## 2020-02-05 DIAGNOSIS — O26892 Other specified pregnancy related conditions, second trimester: Secondary | ICD-10-CM | POA: Insufficient documentation

## 2020-02-12 ENCOUNTER — Ambulatory Visit: Payer: Medicaid Other | Attending: Obstetrics and Gynecology

## 2020-02-12 ENCOUNTER — Other Ambulatory Visit: Payer: Self-pay

## 2020-02-12 DIAGNOSIS — Z148 Genetic carrier of other disease: Secondary | ICD-10-CM

## 2020-02-12 DIAGNOSIS — Z363 Encounter for antenatal screening for malformations: Secondary | ICD-10-CM

## 2020-02-12 DIAGNOSIS — O99212 Obesity complicating pregnancy, second trimester: Secondary | ICD-10-CM | POA: Insufficient documentation

## 2020-02-12 DIAGNOSIS — Z3A24 24 weeks gestation of pregnancy: Secondary | ICD-10-CM | POA: Insufficient documentation

## 2020-02-12 DIAGNOSIS — Z6841 Body Mass Index (BMI) 40.0 and over, adult: Secondary | ICD-10-CM

## 2020-02-13 ENCOUNTER — Other Ambulatory Visit: Payer: Self-pay | Admitting: *Deleted

## 2020-02-13 DIAGNOSIS — R638 Other symptoms and signs concerning food and fluid intake: Secondary | ICD-10-CM

## 2020-02-18 ENCOUNTER — Encounter: Payer: Medicaid Other | Admitting: Certified Nurse Midwife

## 2020-02-18 DIAGNOSIS — O0990 Supervision of high risk pregnancy, unspecified, unspecified trimester: Secondary | ICD-10-CM

## 2020-02-18 DIAGNOSIS — A749 Chlamydial infection, unspecified: Secondary | ICD-10-CM

## 2020-02-23 ENCOUNTER — Ambulatory Visit (INDEPENDENT_AMBULATORY_CARE_PROVIDER_SITE_OTHER): Payer: Medicaid Other | Admitting: Obstetrics and Gynecology

## 2020-02-23 ENCOUNTER — Other Ambulatory Visit: Payer: Self-pay

## 2020-02-23 VITALS — BP 110/75 | HR 94 | Wt 309.0 lb

## 2020-02-23 DIAGNOSIS — R112 Nausea with vomiting, unspecified: Secondary | ICD-10-CM

## 2020-02-23 DIAGNOSIS — O0990 Supervision of high risk pregnancy, unspecified, unspecified trimester: Secondary | ICD-10-CM

## 2020-02-23 DIAGNOSIS — O99210 Obesity complicating pregnancy, unspecified trimester: Secondary | ICD-10-CM

## 2020-02-23 DIAGNOSIS — O26892 Other specified pregnancy related conditions, second trimester: Secondary | ICD-10-CM | POA: Diagnosis present

## 2020-02-23 DIAGNOSIS — O210 Mild hyperemesis gravidarum: Secondary | ICD-10-CM

## 2020-02-23 DIAGNOSIS — O361920 Maternal care for other isoimmunization, second trimester, not applicable or unspecified: Secondary | ICD-10-CM

## 2020-02-23 DIAGNOSIS — R300 Dysuria: Secondary | ICD-10-CM | POA: Diagnosis present

## 2020-02-23 DIAGNOSIS — D563 Thalassemia minor: Secondary | ICD-10-CM

## 2020-02-23 DIAGNOSIS — Z6841 Body Mass Index (BMI) 40.0 and over, adult: Secondary | ICD-10-CM

## 2020-02-23 DIAGNOSIS — F1290 Cannabis use, unspecified, uncomplicated: Secondary | ICD-10-CM

## 2020-02-23 LAB — POCT URINALYSIS DIPSTICK
Bilirubin, UA: NEGATIVE
Blood, UA: NEGATIVE
Glucose, UA: NEGATIVE
Ketones, UA: NEGATIVE
Nitrite, UA: NEGATIVE
Protein, UA: NEGATIVE
Spec Grav, UA: 1.015 (ref 1.010–1.025)
Urobilinogen, UA: 0.2 E.U./dL
pH, UA: 7.5 (ref 5.0–8.0)

## 2020-02-23 NOTE — Progress Notes (Signed)
Pt states LE swelling and pain in feet.  Pt states some burning with urination, request STD check with urine today.

## 2020-02-23 NOTE — Addendum Note (Signed)
Addended by: Marya Landry D on: 02/23/2020 10:10 AM   Modules accepted: Orders

## 2020-02-23 NOTE — Progress Notes (Signed)
   PRENATAL VISIT NOTE  Subjective:  Jacqueline Shepherd is a 30 y.o. G2P0010 at [redacted]w[redacted]d being seen today for ongoing prenatal care.  [name] currently monitored for the following issues for this high-risk pregnancy and has Chlamydia infection affecting pregnancy in second trimester; Rubella non-immune status, antepartum; Supervision of high risk pregnancy, antepartum; Cannabinoid hyperemesis syndrome; Obesity in pregnancy; BMI 40.0-44.9, adult (HCC); Weight loss; Lewis isoimmunization during pregnancy; [redacted] weeks gestation of pregnancy; Tobacco use in pregnancy, antepartum, first trimester; Other social stressor; Hyperemesis gravidarum, antepartum; Alpha thalassemia silent carrier; Abnormal TSH; and Dysuria during pregnancy in second trimester on their problem list.  Patient doing well with no acute concerns today. She reports lower extremity swelling.  Contractions: Not present. Vag. Bleeding: None.  Movement: Present. Denies leaking of fluid.   Pt still is not taking baby ASA.  She has been admonished to take the medication.  Pt advised to elevate her feet above the level of her heart and/or use compression stockings to help decrease swelling. Pt denies return of N/V.  The following portions of the patient's history were reviewed and updated as appropriate: allergies, current medications, past family history, past medical history, past social history, past surgical history and problem list. Problem list updated.  Objective:   Vitals:   02/23/20 0851  BP: 110/75  Pulse: 94  Weight: (!) 309 lb (140.2 kg)    Fetal Status: Fetal Heart Rate (bpm): 145   Movement: Present     General:  Alert, oriented and cooperative. Patient is in no acute distress.  Skin: Skin is warm and dry. No rash noted.   Cardiovascular: Normal heart rate noted  Respiratory: Normal respiratory effort, no problems with respiration noted  Abdomen: Soft, gravid, appropriate for gestational age.  Pain/Pressure: Absent      Pelvic: Cervical exam deferred        Extremities: Normal range of motion.  Edema: Trace  Mental Status:  Normal mood and affect. Normal behavior. Normal judgment and thought content.   Assessment and Plan:  Pregnancy: G2P0010 at [redacted]w[redacted]d  1. Cannabinoid hyperemesis syndrome Currently not present  2. Hyperemesis gravidarum, antepartum No N/V  3. Supervision of high risk pregnancy, antepartum 2 hour GTT next visit, pt advised to premedicate with antiemetic 1 hour before glucose testing  4. BMI 40.0-44.9, adult (HCC)   5. Obesity in pregnancy   6. Lewis isoimmunization during pregnancy in second trimester, single or unspecified fetus Usually not clinically important  7. Alpha thalassemia silent carrier   8. Dysuria during pregnancy in second trimester  - POCT urinalysis dipstick - Culture, OB Urine - GC/Chlamydia Probe Amp([facility])  Preterm labor symptoms and general obstetric precautions including but not limited to vaginal bleeding, contractions, leaking of fluid and fetal movement were reviewed in detail with the patient.  Please refer to After Visit Summary for other counseling recommendations.   Return in about 2 weeks (around 03/08/2020) for [facility], in person, 2 hr GTT, 3rd trim labs.   Mariel Aloe, MD

## 2020-02-24 LAB — URINE CYTOLOGY ANCILLARY ONLY
Chlamydia: NEGATIVE
Comment: NEGATIVE
Comment: NEGATIVE
Comment: NORMAL
Neisseria Gonorrhea: NEGATIVE
Trichomonas: NEGATIVE

## 2020-02-25 LAB — CULTURE, OB URINE

## 2020-02-25 LAB — URINE CULTURE, OB REFLEX

## 2020-03-08 ENCOUNTER — Other Ambulatory Visit: Payer: Medicaid Other

## 2020-03-08 ENCOUNTER — Encounter: Payer: Medicaid Other | Admitting: Obstetrics and Gynecology

## 2020-03-25 ENCOUNTER — Ambulatory Visit: Payer: Medicaid Other | Attending: Obstetrics

## 2020-03-25 ENCOUNTER — Encounter: Payer: Self-pay | Admitting: *Deleted

## 2020-03-25 ENCOUNTER — Ambulatory Visit: Payer: Medicaid Other | Admitting: *Deleted

## 2020-03-25 ENCOUNTER — Other Ambulatory Visit: Payer: Self-pay

## 2020-03-25 ENCOUNTER — Other Ambulatory Visit: Payer: Self-pay | Admitting: Obstetrics and Gynecology

## 2020-03-25 DIAGNOSIS — Z3A30 30 weeks gestation of pregnancy: Secondary | ICD-10-CM

## 2020-03-25 DIAGNOSIS — O358XX0 Maternal care for other (suspected) fetal abnormality and damage, not applicable or unspecified: Secondary | ICD-10-CM | POA: Diagnosis not present

## 2020-03-25 DIAGNOSIS — O99213 Obesity complicating pregnancy, third trimester: Secondary | ICD-10-CM

## 2020-03-25 DIAGNOSIS — E669 Obesity, unspecified: Secondary | ICD-10-CM

## 2020-03-25 DIAGNOSIS — D563 Thalassemia minor: Secondary | ICD-10-CM | POA: Insufficient documentation

## 2020-03-25 DIAGNOSIS — O289 Unspecified abnormal findings on antenatal screening of mother: Secondary | ICD-10-CM

## 2020-03-25 DIAGNOSIS — O288 Other abnormal findings on antenatal screening of mother: Secondary | ICD-10-CM

## 2020-03-25 DIAGNOSIS — R638 Other symptoms and signs concerning food and fluid intake: Secondary | ICD-10-CM | POA: Insufficient documentation

## 2020-03-25 DIAGNOSIS — Z148 Genetic carrier of other disease: Secondary | ICD-10-CM | POA: Diagnosis not present

## 2020-03-25 DIAGNOSIS — O0990 Supervision of high risk pregnancy, unspecified, unspecified trimester: Secondary | ICD-10-CM | POA: Diagnosis present

## 2020-03-31 ENCOUNTER — Other Ambulatory Visit: Payer: Self-pay

## 2020-03-31 ENCOUNTER — Other Ambulatory Visit: Payer: Medicaid Other

## 2020-03-31 DIAGNOSIS — O0990 Supervision of high risk pregnancy, unspecified, unspecified trimester: Secondary | ICD-10-CM

## 2020-04-01 LAB — CBC
Hematocrit: 29.2 % — ABNORMAL LOW (ref 34.0–46.6)
Hemoglobin: 9.6 g/dL — ABNORMAL LOW (ref 11.1–15.9)
MCH: 26.7 pg (ref 26.6–33.0)
MCHC: 32.9 g/dL (ref 31.5–35.7)
MCV: 81 fL (ref 79–97)
Platelets: 208 10*3/uL (ref 150–450)
RBC: 3.59 x10E6/uL — ABNORMAL LOW (ref 3.77–5.28)
RDW: 12.4 % (ref 11.7–15.4)
WBC: 13 10*3/uL — ABNORMAL HIGH (ref 3.4–10.8)

## 2020-04-01 LAB — HIV ANTIBODY (ROUTINE TESTING W REFLEX): HIV Screen 4th Generation wRfx: NONREACTIVE

## 2020-04-01 LAB — GLUCOSE TOLERANCE, 2 HOURS W/ 1HR
Glucose, 1 hour: 135 mg/dL (ref 65–179)
Glucose, 2 hour: 111 mg/dL (ref 65–152)
Glucose, Fasting: 96 mg/dL — ABNORMAL HIGH (ref 65–91)

## 2020-04-01 LAB — RPR: RPR Ser Ql: NONREACTIVE

## 2020-04-06 ENCOUNTER — Telehealth: Payer: Self-pay

## 2020-04-06 DIAGNOSIS — O24410 Gestational diabetes mellitus in pregnancy, diet controlled: Secondary | ICD-10-CM

## 2020-04-06 MED ORDER — ACCU-CHEK AVIVA PLUS W/DEVICE KIT: 1.0000 | PACK | Freq: Once | 0 refills | Status: AC

## 2020-04-06 MED ORDER — GLUCOSE BLOOD VI STRP: ORAL_STRIP | 12 refills | Status: DC

## 2020-04-06 MED ORDER — ACCU-CHEK SOFTCLIX LANCETS MISC: 1 refills | Status: DC

## 2020-04-06 NOTE — Telephone Encounter (Signed)
Patient failed 2 hour gtt.  Referral place to DM education  Testing supplies sent to [facility]

## 2020-04-07 ENCOUNTER — Other Ambulatory Visit: Payer: Self-pay

## 2020-04-07 DIAGNOSIS — O24410 Gestational diabetes mellitus in pregnancy, diet controlled: Secondary | ICD-10-CM

## 2020-04-07 NOTE — Progress Notes (Signed)
Patient aware to download Babyscripts and document all CBG readings

## 2020-04-13 ENCOUNTER — Encounter: Payer: Medicaid Other | Attending: Obstetrics and Gynecology | Admitting: Registered"

## 2020-04-13 ENCOUNTER — Ambulatory Visit: Payer: Medicaid Other | Admitting: Registered"

## 2020-04-13 ENCOUNTER — Other Ambulatory Visit: Payer: Self-pay

## 2020-04-13 DIAGNOSIS — O24410 Gestational diabetes mellitus in pregnancy, diet controlled: Secondary | ICD-10-CM | POA: Diagnosis present

## 2020-04-13 DIAGNOSIS — O24419 Gestational diabetes mellitus in pregnancy, unspecified control: Secondary | ICD-10-CM

## 2020-04-13 NOTE — Progress Notes (Signed)
Patient was seen on 04/13/20 for Gestational Diabetes self-management. EDD 06/03/20; [redacted]w[redacted]d. Patient states no history of GDM. Diet history obtained. Patient eats variety of all food groups. Beverages include water, coffee, soda.    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 Pt states she will call if she has questions.  The following learning objectives were met by the patient :   States the definition of Gestational Diabetes  States why dietary management is important in controlling blood glucose  Describes the effects of carbohydrates on blood glucose levels  Demonstrates ability to create a balanced meal plan  Demonstrates carbohydrate counting   States when to check blood glucose levels  Demonstrates proper blood glucose monitoring techniques  States the effect of stress and exercise on blood glucose levels  States the importance of limiting caffeine and abstaining from alcohol and smoking  Plan:  Aim for 3 Carbohydrate Choices per meal (45 grams) +/- 1 either way  Aim for 1-2 Carbohydrate Choices per snack Begin reading food labels for Total Carbohydrate of foods If OK with your MD, consider  increasing your activity level by walking, Arm Chair Exercises or other activity daily as tolerated Begin checking Blood Glucose before breakfast and 2 hours after first bite of breakfast, lunch and dinner as directed by MD  Bring Log Book/Sheet and meter to every medical appointment  Take medication if directed by MD  Blood glucose monitor given: [employer] #[id number] Exp: 03/14/2021 CBG: 138 mg/dL  Patient instructed to monitor glucose levels: FBS: 60 - 95 mg/dl 2 hour: <120 mg/dl  Patient received the following handouts:  Nutrition Diabetes and Pregnancy  Carbohydrate Counting List  Blood glucose Log Sheet  Patient will be seen for follow-up as needed.

## 2020-04-14 ENCOUNTER — Encounter: Payer: Medicaid Other | Admitting: Obstetrics and Gynecology

## 2020-04-15 ENCOUNTER — Encounter: Payer: Self-pay | Admitting: Obstetrics and Gynecology

## 2020-04-15 ENCOUNTER — Other Ambulatory Visit: Payer: Self-pay

## 2020-04-15 ENCOUNTER — Ambulatory Visit (INDEPENDENT_AMBULATORY_CARE_PROVIDER_SITE_OTHER): Payer: Medicaid Other | Admitting: Obstetrics and Gynecology

## 2020-04-15 VITALS — BP 130/79 | HR 92 | Wt 326.0 lb

## 2020-04-15 DIAGNOSIS — Z6841 Body Mass Index (BMI) 40.0 and over, adult: Secondary | ICD-10-CM

## 2020-04-15 DIAGNOSIS — Z283 Underimmunization status: Secondary | ICD-10-CM

## 2020-04-15 DIAGNOSIS — O99210 Obesity complicating pregnancy, unspecified trimester: Secondary | ICD-10-CM

## 2020-04-15 DIAGNOSIS — O99891 Other specified diseases and conditions complicating pregnancy: Secondary | ICD-10-CM

## 2020-04-15 DIAGNOSIS — O09899 Supervision of other high risk pregnancies, unspecified trimester: Secondary | ICD-10-CM

## 2020-04-15 DIAGNOSIS — O26899 Other specified pregnancy related conditions, unspecified trimester: Secondary | ICD-10-CM

## 2020-04-15 DIAGNOSIS — O0990 Supervision of high risk pregnancy, unspecified, unspecified trimester: Secondary | ICD-10-CM

## 2020-04-15 DIAGNOSIS — O24410 Gestational diabetes mellitus in pregnancy, diet controlled: Secondary | ICD-10-CM

## 2020-04-15 DIAGNOSIS — G5600 Carpal tunnel syndrome, unspecified upper limb: Secondary | ICD-10-CM

## 2020-04-15 DIAGNOSIS — O361930 Maternal care for other isoimmunization, third trimester, not applicable or unspecified: Secondary | ICD-10-CM

## 2020-04-15 DIAGNOSIS — Z3A33 33 weeks gestation of pregnancy: Secondary | ICD-10-CM

## 2020-04-15 MED ORDER — VITAFOL GUMMIES 3.33-0.333-34.8 MG PO CHEW: 3.0000 | CHEWABLE_TABLET | Freq: Every day | ORAL | 3 refills | Status: DC

## 2020-04-15 NOTE — Patient Instructions (Addendum)
Third Trimester of Pregnancy  The third trimester of pregnancy is from week 28 through week 40. This is also called months 7 through 9. This trimester is when your unborn baby (fetus) is growing very fast. At the end of the ninth month, the unborn baby is about 20 inches long. It weighs about 6-10 pounds. Body changes during your third trimester Your body continues to go through many changes during this time. The changes vary and generally return to normal after the baby is born. Physical changes  Your weight will continue to increase. You may gain 25-35 pounds (11-16 kg) by the end of the pregnancy. If you are underweight, you may gain 28-40 lb (about 13-18 kg). If you are overweight, you may gain 15-25 lb (about 7-11 kg).  You may start to get stretch marks on your hips, belly (abdomen), and breasts.  Your breasts will continue to grow and may hurt. A yellow fluid (colostrum) may leak from your breasts. This is the first milk you are making for your baby.  You may have changes in your hair.  Your belly button may stick out.  You may have more swelling in your hands, face, or ankles. Health changes  You may have heartburn.  You may have trouble pooping (constipation).  You may get hemorrhoids. These are swollen veins in the butt that can itch or get painful.  You may have swollen veins (varicose veins) in your legs.  You may have more body aches in the pelvis, back, or thighs.  You may have more tingling or numbness in your hands, arms, and legs. The skin on your belly may also feel numb.  You may feel short of breath as your womb (uterus) gets bigger. Other changes  You may pee (urinate) more often.  You may have more problems sleeping.  You may notice the unborn baby "dropping," or moving lower in your belly.  You may have more discharge coming from your vagina.  Your joints may feel loose, and you may have pain around your pelvic bone. Follow these instructions at  home: Medicines  Take over-the-counter and prescription medicines only as told by your doctor. Some medicines are not safe during pregnancy.  Take a prenatal vitamin that contains at least 600 micrograms (mcg) of folic acid. Eating and drinking  Eat healthy meals that include: ? Fresh fruits and vegetables. ? Whole grains. ? Good sources of protein, such as meat, eggs, or tofu. ? Low-fat dairy products.  Avoid raw meat and unpasteurized juice, milk, and cheese. These carry germs that can harm you and your baby.  Eat 4 or 5 small meals rather than 3 large meals a day.  You may need to take these actions to prevent or treat trouble pooping: ? Drink enough fluids to keep your pee (urine) pale yellow. ? Eat foods that are high in fiber. These include beans, whole grains, and fresh fruits and vegetables. ? Limit foods that are high in fat and sugar. These include fried or sweet foods. Activity  Exercise only as told by your doctor. Stop exercising if you start to have cramps in your womb.  Avoid heavy lifting.  Do not exercise if it is too hot or too humid, or if you are in a place of great height (high altitude).  If you choose to, you may have sex unless your doctor tells you not to. Relieving pain and discomfort  Take breaks often, and rest with your legs raised (elevated) if you have   leg cramps or low back pain.  Take warm water baths (sitz baths) to soothe pain or discomfort caused by hemorrhoids. Use hemorrhoid cream if your doctor approves.  Wear a good support bra if your breasts are tender.  If you develop bulging, swollen veins in your legs: ? Wear support hose as told by your doctor. ? Raise your feet for 15 minutes, 3-4 times a day. ? Limit salt in your food. Safety  Talk to your doctor before traveling far distances.  Do not use hot tubs, steam rooms, or saunas.  Wear your seat belt at all times when you are in a car.  Talk with your doctor if someone is  hurting you or yelling at you a lot. Preparing for your baby's arrival To prepare for the arrival of your baby:  Take prenatal classes.  Visit the hospital and tour the maternity area.  Buy a rear-facing car seat. Learn how to install it in your car.  Prepare the baby's room. Take out all pillows and stuffed animals from the baby's crib. General instructions  Avoid cat litter boxes and soil used by cats. These carry germs that can cause harm to the baby and can cause a loss of your baby by miscarriage or stillbirth.  Do not douche or use tampons. Do not use scented sanitary pads.  Do not smoke or use any products that contain nicotine or tobacco. If you need help quitting, ask your doctor.  Do not drink alcohol.  Do not use herbal medicines, illegal drugs, or medicines that were not approved by your doctor. Chemicals in these products can affect your baby.  Keep all follow-up visits. This is important. Where to find more information  American Pregnancy Association: americanpregnancy.org  American College of Obstetricians and Gynecologists: www.acog.org  Office on Women's Health: womenshealth.gov/pregnancy Contact a doctor if:  You have a fever.  You have mild cramps or pressure in your lower belly.  You have a nagging pain in your belly area.  You vomit, or you have watery poop (diarrhea).  You have bad-smelling fluid coming from your vagina.  You have pain when you pee, or your pee smells bad.  You have a headache that does not go away when you take medicine.  You have changes in how you see, or you see spots in front of your eyes. Get help right away if:  Your water breaks.  You have regular contractions that are less than 5 minutes apart.  You are spotting or bleeding from your vagina.  You have very bad belly cramps or pain.  You have trouble breathing.  You have chest pain.  You faint.  You have not felt the baby move for the amount of time told by  your doctor.  You have new or increased pain, swelling, or redness in an arm or leg. Summary  The third trimester is from week 28 through week 40 (months 7 through 9). This is the time when your unborn baby is growing very fast.  During this time, your discomfort may increase as you gain weight and as your baby grows.  Get ready for your baby to arrive by taking prenatal classes, buying a rear-facing car seat, and preparing the baby's room.  Get help right away if you are bleeding from your vagina, you have chest pain and trouble breathing, or you have not felt the baby move for the amount of time told by your doctor. This information is not intended to replace advice given   to you by your health care provider. Make sure you discuss any questions you have with your health care provider. Document Revised: 06/18/2019 Document Reviewed: 04/24/2019 Elsevier Patient Education  2021 Elsevier Inc.  Gestational Diabetes Mellitus, Self-Care When you have gestational diabetes mellitus, you must make sure your blood sugar (glucose) stays at a healthy level. What are the risks? If you do not get treated for this condition, it may cause problems for you and your unborn baby. For the mother  Giving birth to the baby early.  Having problems during labor and when giving birth.  Needing surgery to give birth to the baby (cesarean delivery).  Having problems with blood pressure.  Getting this form of diabetes again when pregnant.  Getting type 2 diabetes in the future. For the baby  Low blood sugar.  Bigger body size than is normal.  Breathing problems. How to monitor blood sugar Check your blood sugar every day while you are pregnant. Check it as often as told by your doctor. To do this: 1. Wash your hands with soap and water for at least 20 seconds. 2. Prick the side of your finger (not the tip) with the lancet. Use a different finger each time. 3. Gently rub the finger until a small drop  of blood appears. 4. Follow instructions that come with your meter for: ? Putting in the test strip. ? Putting blood on the strip. ? Getting the result. 5. Write down your result and any notes. In general, your blood sugar levels should be:  95 mg/dL (5.3 mmol/L) if you have not eaten.  140 mg/dL (7.8 mmol/L) 1 hour after a meal.  120 mg/dL (6.7 mmol/L) 2 hours after a meal.   Follow these instructions at home: Medicines  Take over-the-counter and prescription medicines only as told by your doctor.  If your doctor prescribed insulin or other diabetes medicines: ? Take them every day. ? Do not run out of insulin or other medicines. Plan ahead so you always have them. Eating and drinking  Follow instructions from your doctor about eating or drinking restrictions.  See a food expert (dietician) to help you create an eating plan that helps control your blood sugar. The foods in this plan will include: ? Low-fat proteins. ? Dried beans, nuts, and whole grain breads, cereals, or pasta. ? Fresh fruits and vegetables. ? Low-fat dairy products. ? Healthy fats.  Eat healthy snacks between healthy meals.  Drink enough fluid to keep your pee (urine) pale yellow.  Keep track of carbs that you eat. To do this: ? Read food labels. ? Learn the serving sizes of foods.  Follow your sick day plan when you cannot eat or drink normally. Make this plan with your doctor so it is ready to use.   Activity  Do exercises as told by your doctor.  Exercise for 30 or more minutes a day, or as much as your doctor recommends. To help you control blood sugar levels after a meal: ? Do 10 minutes of exercise after each meal. ? Start this exercise 30 minutes after the meal.  Talk with your doctor before you start a new exercise. Your doctor may tell you to change your insulin, other medicines, or food. Lifestyle  Do not drink alcohol.  Do not use any products that contain nicotine or tobacco, such  as cigarettes, e-cigarettes, and chewing tobacco. If you need help quitting, ask your doctor.  Learn how to deal with stress. If you need help with  this, ask your doctor. Body care  Stay up to date with your shots (vaccines).  Take good care of your teeth. To do this: ? Brush your teeth and gums two times a day. ? Floss one or more times a day. ? Go to the dentist one or more times every 6 months.  Stay at a healthy weight while you are pregnant. General instructions  Ask your doctor about risks of high blood pressure in pregnancy.  Share your diabetes care plan with: ? Your work or school. ? People you live with.  Check your pee for ketones: ? When you are sick. ? As told by your doctor.  Carry a card or wear a bracelet that says you have diabetes.  Keep all follow-up visits. Care after giving birth  Have your blood sugar checked 4-12 weeks after you give birth.  Get checked for diabetes one or more times every 3 years or as told. Where to find more information  American Diabetes Association (ADA): diabetes.org  Association of Diabetes Care & Education Specialists (ADCES): diabeteseducator.org  Centers for Disease Control and Prevention (CDC): TonerPromos.no  American Pregnancy Association: americanpregnancy.org  [place] Department of Agriculture MyPlate: WrestlingReporter.dk Contact a doctor if:  Your blood sugar is above your target for two tests in a row.  You have a fever.  You are sick for 2 days or more and do not get better.  You have either of these problems for more than 6 hours: ? You vomit every time you eat or drink. ? You have watery poop (diarrhea). Get help right away if:  You cannot think clearly.  You have trouble breathing.  You have moderate or high ketones in your pee.  Blood or abnormal fluid starts to come out of your vagina.  You feel your baby is not moving as usual.  You start having early contractions. You may feel your belly tighten.  You  have a very bad headache. These symptoms may be an emergency. Get help right away. Call your local emergency services (911 in the [place]  Do not wait to see if the symptoms will go away.  Do not drive yourself to the hospital. Summary  Check your blood sugar (glucose) while you are pregnant. Check it as often as told by your doctor.  Take your insulin and diabetes medicines as told.  Have your blood sugar checked 4-12 weeks after you give birth.  Keep all follow-up visits. This information is not intended to replace advice given to you by your health care provider. Make sure you discuss any questions you have with your health care provider. Document Revised: 06/16/2019 Document Reviewed: 06/16/2019 Elsevier Patient Education  2021 [employer].

## 2020-04-15 NOTE — Progress Notes (Signed)
ROB 33w  [id number] in Rt hand   Pt brought blood sugar log today.  Checked fasting today while in office 98  Just received meter x 3 days ago.   PNV refill sent today pt made aware.

## 2020-04-15 NOTE — Progress Notes (Signed)
   PRENATAL VISIT NOTE  Subjective:  Jacqueline Shepherd is a 30 y.o. G2P0010 at [redacted]w[redacted]d being seen today for ongoing prenatal care.  [name] currently monitored for the following issues for this high-risk pregnancy and has Chlamydia infection affecting pregnancy in second trimester; Rubella non-immune status, antepartum; Supervision of high risk pregnancy, antepartum; Cannabinoid hyperemesis syndrome; Obesity in pregnancy; BMI 40.0-44.9, adult (HCC); Weight loss; Lewis isoimmunization during pregnancy; [redacted] weeks gestation of pregnancy; Tobacco use in pregnancy, antepartum, first trimester; Other social stressor; Hyperemesis gravidarum, antepartum; Alpha thalassemia silent carrier; Abnormal TSH; Dysuria during pregnancy in second trimester; Gestational diabetes mellitus (GDM), antepartum; [redacted] weeks gestation of pregnancy; and Carpal tunnel syndrome during pregnancy on their problem list.  Patient doing well with no acute concerns today. She reports carpal tunnel symptoms in the right wrist.  Contractions: Not present. Vag. Bleeding: None.  Movement: Present. Denies leaking of fluid.   The following portions of the patient's history were reviewed and updated as appropriate: allergies, current medications, past family history, past medical history, past social history, past surgical history and problem list. Problem list updated.  Objective:   Vitals:   04/15/20 0915  BP: 130/79  Pulse: 92  Weight: (!) 326 lb (147.9 kg)    Fetal Status: Fetal Heart Rate (bpm): 148   Movement: Present     General:  Alert, oriented and cooperative. Patient is in no acute distress.  Skin: Skin is warm and dry. No rash noted.   Cardiovascular: Normal heart rate noted  Respiratory: Normal respiratory effort, no problems with respiration noted  Abdomen: Soft, gravid, appropriate for gestational age.  Pain/Pressure: Absent     Pelvic: Cervical exam deferred        Extremities: Normal range of motion.  Edema: Trace   Mental Status:  Normal mood and affect. Normal behavior. Normal judgment and thought content.   Assessment and Plan:  Pregnancy: G2P0010 at [redacted]w[redacted]d  1. Supervision of high risk pregnancy, antepartum  - Prenatal Vit-Fe Phos-FA-Omega (VITAFOL GUMMIES) 3.33-0.333-34.8 MG CHEW; Chew 3 each by mouth daily.  Dispense: 90 tablet; Refill: 3  2. Diet controlled gestational diabetes mellitus (GDM), antepartum Pt has one day of blood sugars recorded. But she just received the meter. Fasting blood sugar slightly elevated.  2 postpranidals WNL  3. BMI 40.0-44.9, adult (HCC)   4. Lewis isoimmunization during pregnancy in third trimester, single or unspecified fetus   5. Rubella non-immune status, antepartum   6. Obesity in pregnancy   7. [redacted] weeks gestation of pregnancy   8. Carpal tunnel syndrome during pregnancy Advised pt to look into acquiring wrist brace  Preterm labor symptoms and general obstetric precautions including but not limited to vaginal bleeding, contractions, leaking of fluid and fetal movement were reviewed in detail with the patient.  Please refer to After Visit Summary for other counseling recommendations.   Return in about 2 weeks (around 04/29/2020) for [facility], in person.   Mariel Aloe, MD [facility] Attending [facility]

## 2020-04-19 ENCOUNTER — Other Ambulatory Visit: Payer: Self-pay

## 2020-04-23 ENCOUNTER — Ambulatory Visit: Payer: Medicaid Other

## 2020-04-29 ENCOUNTER — Ambulatory Visit (INDEPENDENT_AMBULATORY_CARE_PROVIDER_SITE_OTHER): Payer: Medicaid Other

## 2020-04-29 ENCOUNTER — Other Ambulatory Visit: Payer: Self-pay

## 2020-04-29 ENCOUNTER — Encounter: Payer: Medicaid Other | Admitting: Obstetrics and Gynecology

## 2020-04-29 ENCOUNTER — Ambulatory Visit (INDEPENDENT_AMBULATORY_CARE_PROVIDER_SITE_OTHER): Payer: Medicaid Other | Admitting: Obstetrics and Gynecology

## 2020-04-29 VITALS — BP 142/78 | HR 97 | Wt 337.3 lb

## 2020-04-29 DIAGNOSIS — O361930 Maternal care for other isoimmunization, third trimester, not applicable or unspecified: Secondary | ICD-10-CM

## 2020-04-29 DIAGNOSIS — O24410 Gestational diabetes mellitus in pregnancy, diet controlled: Secondary | ICD-10-CM

## 2020-04-29 DIAGNOSIS — O0990 Supervision of high risk pregnancy, unspecified, unspecified trimester: Secondary | ICD-10-CM

## 2020-04-29 NOTE — Progress Notes (Signed)
Pt informed that the ultrasound is considered a limited OB ultrasound and is not intended to be a complete ultrasound exam.  Patient also informed that the ultrasound is not being completed with the intent of assessing for fetal or placental anomalies or any pelvic abnormalities.  Explained that the purpose of today's ultrasound is to assess for presentation, BPP and amniotic fluid volume.  Patient acknowledges the purpose of the exam and the limitations of the study.    Pt scheduled for NST/BPP today based on recommendation from MFM @ last [place] on 3/3, however the indication was not stated. Pt is diet controlled Gestational Diabetes and does not require weekly fetal testing per Antenatal testing Guidelines. Dr. Alysia Penna in to speak with pt @ my request due to pt did not understand about Lewis isoimmunization. Pt missed last [place] for fetal growth on 4/1. I rescheduled appt for pt tomorrow @ 12:30. Pt has next prenatal visit @ [facility] on 4/11.   Pt was taken to [employer] today due to documented food insecurity on her questionnaire.

## 2020-04-30 ENCOUNTER — Ambulatory Visit: Payer: Medicaid Other

## 2020-04-30 ENCOUNTER — Encounter: Payer: Self-pay | Admitting: *Deleted

## 2020-04-30 ENCOUNTER — Ambulatory Visit: Payer: Medicaid Other | Admitting: *Deleted

## 2020-04-30 ENCOUNTER — Ambulatory Visit: Payer: Medicaid Other | Attending: Obstetrics and Gynecology

## 2020-04-30 DIAGNOSIS — O289 Unspecified abnormal findings on antenatal screening of mother: Secondary | ICD-10-CM | POA: Insufficient documentation

## 2020-04-30 DIAGNOSIS — Z148 Genetic carrier of other disease: Secondary | ICD-10-CM | POA: Diagnosis present

## 2020-04-30 DIAGNOSIS — D563 Thalassemia minor: Secondary | ICD-10-CM | POA: Diagnosis present

## 2020-04-30 DIAGNOSIS — O99213 Obesity complicating pregnancy, third trimester: Secondary | ICD-10-CM | POA: Diagnosis present

## 2020-04-30 DIAGNOSIS — E669 Obesity, unspecified: Secondary | ICD-10-CM

## 2020-04-30 DIAGNOSIS — O0990 Supervision of high risk pregnancy, unspecified, unspecified trimester: Secondary | ICD-10-CM | POA: Diagnosis present

## 2020-04-30 DIAGNOSIS — Z3A35 35 weeks gestation of pregnancy: Secondary | ICD-10-CM

## 2020-05-03 ENCOUNTER — Other Ambulatory Visit: Payer: Self-pay

## 2020-05-03 ENCOUNTER — Encounter: Payer: Self-pay | Admitting: Obstetrics and Gynecology

## 2020-05-03 ENCOUNTER — Ambulatory Visit (INDEPENDENT_AMBULATORY_CARE_PROVIDER_SITE_OTHER): Payer: Medicaid Other | Admitting: Obstetrics and Gynecology

## 2020-05-03 VITALS — BP 120/73 | HR 96 | Wt 337.0 lb

## 2020-05-03 DIAGNOSIS — O99210 Obesity complicating pregnancy, unspecified trimester: Secondary | ICD-10-CM

## 2020-05-03 DIAGNOSIS — O24410 Gestational diabetes mellitus in pregnancy, diet controlled: Secondary | ICD-10-CM

## 2020-05-03 DIAGNOSIS — O09899 Supervision of other high risk pregnancies, unspecified trimester: Secondary | ICD-10-CM

## 2020-05-03 DIAGNOSIS — G5600 Carpal tunnel syndrome, unspecified upper limb: Secondary | ICD-10-CM

## 2020-05-03 DIAGNOSIS — O26899 Other specified pregnancy related conditions, unspecified trimester: Secondary | ICD-10-CM

## 2020-05-03 DIAGNOSIS — O0990 Supervision of high risk pregnancy, unspecified, unspecified trimester: Secondary | ICD-10-CM

## 2020-05-03 DIAGNOSIS — Z2839 Other underimmunization status: Secondary | ICD-10-CM

## 2020-05-03 NOTE — Progress Notes (Signed)
   PRENATAL VISIT NOTE  Subjective:  Jacqueline Shepherd is a 30 y.o. G2P0010 at [redacted]w[redacted]d being seen today for ongoing prenatal care.  [name] currently monitored for the following issues for this high-risk pregnancy and has Chlamydia infection affecting pregnancy in second trimester; Rubella non-immune status, antepartum; Supervision of high risk pregnancy, antepartum; Cannabinoid hyperemesis syndrome; Obesity in pregnancy; BMI 40.0-44.9, adult (HCC); Weight loss; Lewis isoimmunization during pregnancy; Tobacco use in pregnancy, antepartum, first trimester; Other social stressor; Hyperemesis gravidarum, antepartum; Alpha thalassemia silent carrier; Abnormal TSH; Dysuria during pregnancy in second trimester; Gestational diabetes mellitus (GDM), antepartum; and Carpal tunnel syndrome during pregnancy on their problem list.  Patient reports no complaints.  Contractions: Irregular. Vag. Bleeding: None.  Movement: Present. Denies leaking of fluid.   The following portions of the patient's history were reviewed and updated as appropriate: allergies, current medications, past family history, past medical history, past social history, past surgical history and problem list.   Objective:   Vitals:   05/03/20 1410  BP: 120/73  Pulse: 96  Weight: (!) 337 lb (152.9 kg)    Fetal Status: Fetal Heart Rate (bpm): 132 Fundal Height: 36 cm Movement: Present     General:  Alert, oriented and cooperative. Patient is in no acute distress.  Skin: Skin is warm and dry. No rash noted.   Cardiovascular: Normal heart rate noted  Respiratory: Normal respiratory effort, no problems with respiration noted  Abdomen: Soft, gravid, appropriate for gestational age.  Pain/Pressure: Present     Pelvic: Cervical exam deferred        Extremities: Normal range of motion.  Edema: Trace  Mental Status: Normal mood and affect. Normal behavior. Normal judgment and thought content.   Assessment and Plan:  Pregnancy: G2P0010 at  [redacted]w[redacted]d 1. Supervision of high risk pregnancy, antepartum Patient is doing well without complaints GBS and cultures next visit  2. Diet controlled gestational diabetes mellitus (GDM), antepartum CBGs reviewed and all within range including fastings Patient stated that prior to 04/30/20 she would eat in the middle of the night, thus not true fasting values.  BPP ordered per MFM  3. Rubella non-immune status, antepartum Will offer pp  4. Obesity in pregnancy Continue ASA  5. Carpal tunnel syndrome during pregnancy Patient with persistent finger numbness. Patient has not been using wrist brace but plans to pick it up this week  Preterm labor symptoms and general obstetric precautions including but not limited to vaginal bleeding, contractions, leaking of fluid and fetal movement were reviewed in detail with the patient. Please refer to After Visit Summary for other counseling recommendations.   Return in about 1 week (around 05/10/2020) for in person, ROB, High risk, GBS.  No future appointments.  Catalina Antigua, MD

## 2020-05-03 NOTE — Progress Notes (Signed)
+   Fetal movement. Pt states she is having numbness in the right hand. Pt has blood sugar logs to review.

## 2020-05-07 ENCOUNTER — Other Ambulatory Visit: Payer: Self-pay

## 2020-05-07 ENCOUNTER — Other Ambulatory Visit: Payer: Self-pay | Admitting: Advanced Practice Midwife

## 2020-05-07 ENCOUNTER — Encounter (HOSPITAL_COMMUNITY): Payer: Self-pay | Admitting: Obstetrics & Gynecology

## 2020-05-07 ENCOUNTER — Inpatient Hospital Stay (HOSPITAL_COMMUNITY)
Admission: AD | Admit: 2020-05-07 | Discharge: 2020-05-07 | Disposition: A | Discharge status: Home / Self Care | Payer: Medicaid Other | Attending: Obstetrics & Gynecology | Admitting: Obstetrics & Gynecology

## 2020-05-07 DIAGNOSIS — Z7982 Long term (current) use of aspirin: Secondary | ICD-10-CM | POA: Diagnosis not present

## 2020-05-07 DIAGNOSIS — O1400 Mild to moderate pre-eclampsia, unspecified trimester: Secondary | ICD-10-CM | POA: Diagnosis not present

## 2020-05-07 DIAGNOSIS — R1030 Lower abdominal pain, unspecified: Secondary | ICD-10-CM | POA: Diagnosis not present

## 2020-05-07 DIAGNOSIS — O0990 Supervision of high risk pregnancy, unspecified, unspecified trimester: Secondary | ICD-10-CM

## 2020-05-07 DIAGNOSIS — O26893 Other specified pregnancy related conditions, third trimester: Secondary | ICD-10-CM | POA: Diagnosis present

## 2020-05-07 DIAGNOSIS — O99333 Smoking (tobacco) complicating pregnancy, third trimester: Secondary | ICD-10-CM | POA: Insufficient documentation

## 2020-05-07 DIAGNOSIS — Z3A36 36 weeks gestation of pregnancy: Secondary | ICD-10-CM | POA: Diagnosis not present

## 2020-05-07 DIAGNOSIS — F17210 Nicotine dependence, cigarettes, uncomplicated: Secondary | ICD-10-CM | POA: Insufficient documentation

## 2020-05-07 DIAGNOSIS — R519 Headache, unspecified: Secondary | ICD-10-CM | POA: Insufficient documentation

## 2020-05-07 DIAGNOSIS — Z3689 Encounter for other specified antenatal screening: Secondary | ICD-10-CM

## 2020-05-07 DIAGNOSIS — O1493 Unspecified pre-eclampsia, third trimester: Secondary | ICD-10-CM

## 2020-05-07 DIAGNOSIS — Z79899 Other long term (current) drug therapy: Secondary | ICD-10-CM | POA: Insufficient documentation

## 2020-05-07 DIAGNOSIS — D563 Thalassemia minor: Secondary | ICD-10-CM

## 2020-05-07 LAB — URINALYSIS, ROUTINE W REFLEX MICROSCOPIC
Bilirubin Urine: NEGATIVE
Glucose, UA: NEGATIVE mg/dL
Hgb urine dipstick: NEGATIVE
Ketones, ur: NEGATIVE mg/dL
Nitrite: NEGATIVE
Protein, ur: 30 mg/dL — AB
Specific Gravity, Urine: 1.017 (ref 1.005–1.030)
pH: 6 (ref 5.0–8.0)

## 2020-05-07 LAB — COMPREHENSIVE METABOLIC PANEL
ALT: 12 U/L (ref 0–44)
AST: 31 U/L (ref 15–41)
Albumin: 2.7 g/dL — ABNORMAL LOW (ref 3.5–5.0)
Alkaline Phosphatase: 102 U/L (ref 38–126)
Anion gap: 7 (ref 5–15)
BUN: 9 mg/dL (ref 6–20)
CO2: 20 mmol/L — ABNORMAL LOW (ref 22–32)
Calcium: 8.7 mg/dL — ABNORMAL LOW (ref 8.9–10.3)
Chloride: 106 mmol/L (ref 98–111)
Creatinine, Ser: 0.52 mg/dL (ref 0.44–1.00)
GFR, Estimated: >60 mL/min (ref >60)
Glucose, Bld: 104 mg/dL — ABNORMAL HIGH (ref 70–99)
Potassium: 5.7 mmol/L — ABNORMAL HIGH (ref 3.5–5.1)
Sodium: 133 mmol/L — ABNORMAL LOW (ref 135–145)
Total Bilirubin: 1 mg/dL (ref 0.3–1.2)
Total Protein: 5.7 g/dL — ABNORMAL LOW (ref 6.5–8.1)

## 2020-05-07 LAB — PROTEIN / CREATININE RATIO, URINE
Creatinine, Urine: 107.09 mg/dL
Protein Creatinine Ratio: 0.37 mg/mg{Cre} — ABNORMAL HIGH (ref 0.00–0.15)
Total Protein, Urine: 40 mg/dL

## 2020-05-07 LAB — CBC
HCT: 29.6 % — ABNORMAL LOW (ref 36.0–46.0)
Hemoglobin: 9.4 g/dL — ABNORMAL LOW (ref 12.0–15.0)
MCH: 27.1 pg (ref 26.0–34.0)
MCHC: 31.8 g/dL (ref 30.0–36.0)
MCV: 85.3 fL (ref 80.0–100.0)
Platelets: 229 10*3/uL (ref 150–400)
RBC: 3.47 MIL/uL — ABNORMAL LOW (ref 3.87–5.11)
RDW: 14.4 % (ref 11.5–15.5)
WBC: 10.7 10*3/uL — ABNORMAL HIGH (ref 4.0–10.5)
nRBC: 0 % (ref 0.0–0.2)

## 2020-05-07 MED ORDER — SODIUM CHLORIDE 0.9 % IV SOLN
25.0000 mg | Freq: Once | INTRAVENOUS | Status: AC
  Administered 2020-05-07: 25 mg via INTRAVENOUS
  Filled 2020-05-07: qty 1

## 2020-05-07 MED ORDER — SODIUM CHLORIDE 0.9 % IV SOLN: Freq: Once | INTRAVENOUS | Status: AC

## 2020-05-07 MED ORDER — CYCLOBENZAPRINE HCL 5 MG PO TABS
10.0000 mg | ORAL_TABLET | Freq: Once | ORAL | Status: AC
  Administered 2020-05-07: 10 mg via ORAL
  Filled 2020-05-07: qty 2

## 2020-05-07 MED ORDER — SODIUM CHLORIDE 0.9% FLUSH
10.0000 mL | INTRAVENOUS | Status: DC | PRN
  Administered 2020-05-07: 10 mL via INTRAVENOUS

## 2020-05-07 MED ORDER — LACTATED RINGERS IV BOLUS
500.0000 mL | Freq: Once | INTRAVENOUS | Status: AC
  Administered 2020-05-07: 500 mL via INTRAVENOUS

## 2020-05-07 MED ORDER — SODIUM CHLORIDE 0.9 % IV SOLN
510.0000 mg | Freq: Once | INTRAVENOUS | Status: AC
  Administered 2020-05-07: 510 mg via INTRAVENOUS
  Filled 2020-05-07: qty 17

## 2020-05-07 MED ORDER — ACETAMINOPHEN 500 MG PO TABS
1000.0000 mg | ORAL_TABLET | Freq: Once | ORAL | Status: AC
  Administered 2020-05-07: 1000 mg via ORAL
  Filled 2020-05-07: qty 2

## 2020-05-07 NOTE — MAU Note (Signed)
Iv team at bedside  

## 2020-05-07 NOTE — MAU Note (Signed)
Jacqueline Shepherd is a 30 y.o. at [redacted]w[redacted]d here in MAU reporting: pt arrived via EMS complaining of abdominal pain. Denies bleeding. No LOF. +FM  Onset of complaint: last night at 2100  Pain score: 9/10  Vitals:   05/07/20 1123  BP: (!) 142/96  Pulse: 90  Resp: (!) 22  Temp: 99 F (37.2 C)  SpO2: 100%     FHT: +FM, EFM applied in room  Lab orders placed from triage: UA

## 2020-05-07 NOTE — Discharge Instructions (Signed)
Preeclampsia and Eclampsia Preeclampsia is a serious condition that may develop during pregnancy. This condition involves high blood pressure during pregnancy and causes symptoms such as headaches, vision changes, and increased swelling in the legs, hands, and face. Preeclampsia occurs after 20 weeks of pregnancy. Eclampsia is a seizure that happens from worsening preeclampsia. Diagnosing and managing preeclampsia early is important. If not treated early, it can cause serious problems for mother and baby. There is no cure for this condition. However, during pregnancy, delivering the baby may be the best treatment for preeclampsia or eclampsia. For most women, symptoms of preeclampsia and eclampsia go away after giving birth. In rare cases, a woman may develop preeclampsia or eclampsia after giving birth. This usually occurs within 48 hours after childbirth but may occur up to 6 weeks after giving birth. What are the causes? The cause of this condition is not known. What increases the risk? The following factors make you more likely to develop preeclampsia:  Being pregnant for the first time or being pregnant with multiples.  Having had preeclampsia or a condition called hemolysis, elevated liver enzymes, and low platelet count (HELLP)syndrome during a past pregnancy.  Having a family history of preeclampsia.  Being older than age 35.  Being obese.  Becoming pregnant through fertility treatments. Conditions that reduce blood flow or oxygen to your placenta and baby may also increase your risk. These include:  High blood pressure before, during, or immediately following pregnancy.  Kidney disease.  Diabetes.  Blood clotting disorders.  Autoimmune diseases, such as lupus.  Sleep apnea. What are the signs or symptoms? Common symptoms of this condition include:  A severe, throbbing headache that does not go away.  Vision problems, such as blurred or double vision and light  sensitivity.  Pain in the stomach, especially the right upper region.  Pain in the shoulder. Other symptoms that may develop as the condition gets worse include:  Sudden weight gain because of fluid buildup in the body. This causes swelling of the face, hands, legs, and feet.  Severe nausea and vomiting.  Urinating less than usual.  Shortness of breath.  Seizures. How is this diagnosed? Your health care provider will ask you about symptoms and check for signs of preeclampsia during your prenatal visits. You will also have routine tests, including:  Checking your blood pressure.  Urine tests to check for protein.  Blood tests to assess your organ function.  Monitoring your baby's heart rate.  Ultrasounds to check fetal growth.   How is this treated? You and your health care provider will determine the treatment that is best for you. Treatment may include:  Frequent prenatal visits to check for preeclampsia.  Medicine to lower your blood pressure.  Medicine to prevent seizures.  Low-dose aspirin during your pregnancy.  Staying in the hospital, in severe cases. You will be given medicines to control your blood pressure and the amount of fluids in your body.  Delivering your baby. Work with your health care provider to manage any chronic health conditions, such as diabetes or kidney problems. Also, work with your health care provider to manage weight gain during pregnancy. Follow these instructions at home: Eating and drinking  Drink enough fluid to keep your urine pale yellow.  Avoid caffeine. Caffeine may increase blood pressure and heart rate and lead to dehydration.  Reduce the amount of salt that you eat. Lifestyle  Do not use any products that contain nicotine or tobacco. These products include cigarettes, chewing tobacco, and   vaping devices, such as e-cigarettes. If you need help quitting, ask your health care provider.  Do not use alcohol or drugs.  Avoid  stress as much as possible.  Rest and get plenty of sleep. General instructions  Take over-the-counter and prescription medicines only as told by your health care provider.  When lying down, lie on your left side. This keeps pressure off your major blood vessels.  When sitting or lying down, raise (elevate) your feet. Try putting pillows underneath your lower legs.  Exercise regularly. Ask your health care provider what kinds of exercise are best for you.  Check your blood pressure as often as recommended by your health care provider.  Keep all prenatal and follow-up visits. This is important.   Contact a health care provider if:  You have symptoms that may need treatment or closer monitoring. These include: ? Headaches. ? Stomach pain or nausea and vomiting. ? Shoulder pain. ? Vision problems, such as spots in front of your eyes or blurry vision. ? Sudden weight gain or increased swelling in your face, hands, legs, and feet. ? Increased anxiety or feeling of impending doom. ? Signs or symptoms of labor. Get help right away if:  You have any of the following symptoms: ? A seizure. ? Shortness of breath or trouble breathing. ? Trouble speaking or slurred speech. ? Fainting. ? Chest pain. These symptoms may represent a serious problem that is an emergency. Do not wait to see if the symptoms will go away. Get medical help right away. Call your local emergency services (911 in the [place] Do not drive yourself to the hospital. Summary  Preeclampsia is a serious condition that may develop during pregnancy.  Diagnosing and treating preeclampsia early is very important.  Keep all prenatal and follow-up visits. This is important.  Get help right away if you have a seizure, shortness of breath or trouble breathing, trouble speaking or slurred speech, chest pain, or fainting. This information is not intended to replace advice given to you by your health care provider. Make sure you  discuss any questions you have with your health care provider. Document Revised: 10/02/2019 Document Reviewed: 10/02/2019 Elsevier Patient Education  2021 [employer]  

## 2020-05-07 NOTE — Progress Notes (Signed)
Orders placed for IOL on 05/13/2020. Buccal Cytotec.  Clayton Bibles, MSN, CNM Certified Nurse Midwife, [facility] for Jacqueline Shepherd, [facility] 05/07/20 5:41 PM

## 2020-05-07 NOTE — MAU Provider Note (Signed)
History     CSN: [id number]  Arrival date and time: 05/07/20 1116   Event Date/Time   First Provider Initiated Contact with Patient 05/07/20 1122      Chief Complaint  Patient presents with  . Abdominal Pain   HPI Jacqueline Shepherd is a 30 y.o. G2P0010 at 62w1dwho presents to MAU via EMS with chief complaint of contractions pain. This is a new problem, onset last night. She reports 9/10 lower abdominal pain. Her pain does not radiate. She has not taken medication or tried other treatments for this complaint.  Patient also c/o headache, recurrent. She endorses bilateral anterior pain, score of 7/10 on arrival to MAU. Her pain does not radiate. She denies aggravating or alleviating factors. She denies history of HTN. She denies visual disturbances, RUQ/epigastric pain, new onset swelling or weight gain.   Patient's pregnancy is c/b A1GDM. She denies dizziness, lightheadedness, weakness, syncope.  OB History    Gravida  2   Para      Term      Preterm      AB  1   Living        SAB  1   IAB      Ectopic      Multiple      Live Births              Past Medical History:  Diagnosis Date  . Abdominal pain   . Cannabinoid hyperemesis syndrome   . Chlamydia infection complicating pregnancy in second trimester 08/18/2018   Negative test of cure on 08/06/18  . Transient hypertension of pregnancy 10/12/2019  . Weight loss 10/12/2019    Past Surgical History:  Procedure Laterality Date  . WISDOM TOOTH EXTRACTION      Family History  Problem Relation Age of Onset  . Diabetes Mother   . Hypertension Mother     Social History   Tobacco Use  . Smoking status: Current Some Day Smoker    Packs/day: 0.50    Last attempt to quit: 01/22/2020    Years since quitting: 0.2  . Smokeless tobacco: Never Used  Vaping Use  . Vaping Use: Never used  Substance Use Topics  . Alcohol use: No  . Drug use: Yes    Types: Marijuana    Comment: 05/06/20    Allergies: No  Known Allergies  Medications Prior to Admission  Medication Sig Dispense Refill Last Dose  . Accu-Chek Softclix Lancets lancets Please check blood sugar up to four times per day 100 each 1   . aspirin EC 81 MG tablet Take 1 tablet (81 mg total) by mouth daily. Swallow whole. 30 tablet 11   . Blood Pressure Monitor KIT 1 Device by Does not apply route once a week. To be monitored Regularly at home. 1 kit 0   . famotidine (PEPCID) 20 MG tablet Take 1 tablet (20 mg total) by mouth 2 (two) times daily. 30 tablet 2   . glucose blood test strip Use as instructed 100 each 12   . metoCLOPramide (REGLAN) 10 MG tablet Take 1 tablet (10 mg total) by mouth every 6 (six) hours. 30 tablet 2   . ondansetron (ZOFRAN ODT) 4 MG disintegrating tablet Take 1 tablet (4 mg total) by mouth every 6 (six) hours as needed for nausea. 30 tablet 3   . Prenatal Vit-Fe Fumarate-FA (PREPLUS) 27-1 MG TABS Take 1 tablet by mouth daily. 30 tablet 13   . Prenatal Vit-Fe Phos-FA-Omega (VITAFOL  GUMMIES) 3.33-0.333-34.8 MG CHEW Chew 3 each by mouth daily. 90 tablet 3   . promethazine (PHENERGAN) 25 MG suppository Place 1 suppository (25 mg total) rectally every 6 (six) hours as needed for nausea or vomiting. 12 each 2   . scopolamine (TRANSDERM-SCOP) 1 MG/3DAYS Place 1 patch (1.5 mg total) onto the skin every 3 (three) days. 10 patch 12     Review of Systems  Constitutional: Positive for fatigue.  Gastrointestinal: Positive for abdominal pain.  Neurological: Positive for headaches.  All other systems reviewed and are negative.  Physical Exam   Blood pressure (!) 130/93, pulse 85, temperature 98.5 F (36.9 C), temperature source Oral, resp. rate 20, last menstrual period 09/17/2019, SpO2 100 %.  Physical Exam Vitals and nursing note reviewed. Exam conducted with a chaperone present.  Constitutional:      General: [name] in acute distress.     Appearance: [name] well-developed. [name] obese. [name] not ill-appearing,  toxic-appearing or diaphoretic.  Cardiovascular:     Rate and Rhythm: Normal rate and regular rhythm.     Heart sounds: Normal heart sounds.  Pulmonary:     Effort: Pulmonary effort is normal.     Breath sounds: Normal breath sounds.  Abdominal:     Palpations: Abdomen is soft.     Tenderness: There is no abdominal tenderness. There is left CVA tenderness. There is no right CVA tenderness, guarding or rebound.     Comments: Gravid  Skin:    General: Skin is warm and dry.     Capillary Refill: Capillary refill takes less than 2 seconds.  Neurological:     Mental Status: [name] alert and oriented to person, place, and time.  Psychiatric:        Mood and Affect: Mood normal.        Behavior: Behavior normal.     MAU Course  Procedures  --BP with systolic of 001 communicated during radio call from EMS crew. Multiple hypertensive pressures on chart. Problem list contains mention of transient HTN (found in the "Resolved" section of problem list).  Will collect labs and treat HA.  --CNM returned to bedside 15 min after patient arrival. Patient now calm, engaged in care, no external signs of discomfort. Requesting crackers  --Patient sleeping one and two hours after Tylenol and Flexeril. Consistenly wakes up when CNM or RN enter room, endorses headache pain. Discussed with patient that falling asleep is considered sign of management of pain. Patient verbalizes understanding  --Reactive tracing: baseline 135, mod var, + accels, no decels. Toco: irregular UI, otherwise quiet  --Discussed with Dr. Harolyn Rutherford. Pt stable for discharge home with precautions  Patient Vitals for the past 24 hrs:  BP Temp Temp src Pulse Resp SpO2  05/07/20 1531 102/77 -- -- 99 -- --  05/07/20 1448 (!) 130/93 98.5 F (36.9 C) Oral 85 20 --  05/07/20 1432 118/64 -- -- 82 -- --  05/07/20 1417 130/60 -- -- 82 -- --  05/07/20 1402 114/61 -- -- 88 -- --  05/07/20 1347 132/84 -- -- 80 -- --  05/07/20 1332 (!) 112/58  -- -- 79 -- --  05/07/20 1326 116/65 -- -- 81 -- --  05/07/20 1247 (!) 117/91 -- -- (!) 116 -- --  05/07/20 1232 122/72 -- -- 89 -- --  05/07/20 1226 (!) 132/56 -- -- 82 -- --  05/07/20 1132 125/80 -- -- 89 -- --  05/07/20 1123 (!) 142/96 99 F (37.2 C) Oral  90 (!) 22 100 %   Results for orders placed or performed during the hospital encounter of 05/07/20 (from the past 24 hour(s))  Protein / creatinine ratio, urine     Status: Abnormal   Collection Time: 05/07/20 11:24 AM  Result Value Ref Range   Creatinine, Urine 107.09 mg/dL   Total Protein, Urine 40 mg/dL   Protein Creatinine Ratio 0.37 (H) 0.00 - 0.15 mg/mg[Cre]  Urinalysis, Routine w reflex microscopic Urine, Clean Catch     Status: Abnormal   Collection Time: 05/07/20 11:42 AM  Result Value Ref Range   Color, Urine YELLOW YELLOW   APPearance HAZY (A) CLEAR   Specific Gravity, Urine 1.017 1.005 - 1.030   pH 6.0 5.0 - 8.0   Glucose, UA NEGATIVE NEGATIVE mg/dL   Hgb urine dipstick NEGATIVE NEGATIVE   Bilirubin Urine NEGATIVE NEGATIVE   Ketones, ur NEGATIVE NEGATIVE mg/dL   Protein, ur 30 (A) NEGATIVE mg/dL   Nitrite NEGATIVE NEGATIVE   Leukocytes,Ua TRACE (A) NEGATIVE   RBC / HPF 0-5 0 - 5 RBC/hpf   WBC, UA 6-10 0 - 5 WBC/hpf   Bacteria, UA RARE (A) NONE SEEN   Squamous Epithelial / LPF 0-5 0 - 5   Mucus PRESENT   CBC     Status: Abnormal   Collection Time: 05/07/20 12:20 PM  Result Value Ref Range   WBC 10.7 (H) 4.0 - 10.5 K/uL   RBC 3.47 (L) 3.87 - 5.11 MIL/uL   Hemoglobin 9.4 (L) 12.0 - 15.0 g/dL   HCT 29.6 (L) 36.0 - 46.0 %   MCV 85.3 80.0 - 100.0 fL   MCH 27.1 26.0 - 34.0 pg   MCHC 31.8 30.0 - 36.0 g/dL   RDW 14.4 11.5 - 15.5 %   Platelets 229 150 - 400 K/uL   nRBC 0.0 0.0 - 0.2 %  Comprehensive metabolic panel     Status: Abnormal   Collection Time: 05/07/20 12:20 PM  Result Value Ref Range   Sodium 133 (L) 135 - 145 mmol/L   Potassium 5.7 (H) 3.5 - 5.1 mmol/L   Chloride 106 98 - 111 mmol/L   CO2 20  (L) 22 - 32 mmol/L   Glucose, Bld 104 (H) 70 - 99 mg/dL   BUN 9 6 - 20 mg/dL   Creatinine, Ser 0.52 0.44 - 1.00 mg/dL   Calcium 8.7 (L) 8.9 - 10.3 mg/dL   Total Protein 5.7 (L) 6.5 - 8.1 g/dL   Albumin 2.7 (L) 3.5 - 5.0 g/dL   AST 31 15 - 41 U/L   ALT 12 0 - 44 U/L   Alkaline Phosphatase 102 38 - 126 U/L   Total Bilirubin 1.0 0.3 - 1.2 mg/dL   GFR, Estimated >60 >60 mL/min   Anion gap 7 5 - 15   Meds ordered this encounter  Medications  . lactated ringers bolus 500 mL  . acetaminophen (TYLENOL) tablet 1,000 mg  . cyclobenzaprine (FLEXERIL) tablet 10 mg  . promethazine (PHENERGAN) 25 mg in sodium chloride 0.9 % 50 mL IVPB  . ferumoxytol (FERAHEME) 510 mg in sodium chloride 0.9 % 100 mL IVPB  . sodium chloride flush (NS) 0.9 % injection 10 mL  . 0.9 %  sodium chloride infusion    Assessment and Plan  --30 y.o. G2P0010 at [redacted]w[redacted]d --Chronic vs GHTN with SIPC w/o SF --Reactive tracing --Not laboring --Sleeping after PO pain medication --Discharge home in stable condition with PEC precautions  F[facility]U: [facility] --IOL for PEC made for midnight 05/13/2020, arrive 2345 on 04/20  Darlina Rumpf, CNM 05/07/2020, 5:36 PM

## 2020-05-10 ENCOUNTER — Telehealth (HOSPITAL_COMMUNITY): Payer: Self-pay | Admitting: *Deleted

## 2020-05-10 ENCOUNTER — Encounter: Payer: Self-pay | Admitting: *Deleted

## 2020-05-10 ENCOUNTER — Other Ambulatory Visit: Payer: Self-pay

## 2020-05-10 ENCOUNTER — Ambulatory Visit (HOSPITAL_BASED_OUTPATIENT_CLINIC_OR_DEPARTMENT_OTHER): Payer: Medicaid Other

## 2020-05-10 ENCOUNTER — Ambulatory Visit: Payer: Medicaid Other | Admitting: *Deleted

## 2020-05-10 ENCOUNTER — Ambulatory Visit (INDEPENDENT_AMBULATORY_CARE_PROVIDER_SITE_OTHER): Payer: Medicaid Other | Admitting: Family Medicine

## 2020-05-10 ENCOUNTER — Encounter (HOSPITAL_COMMUNITY): Payer: Self-pay | Admitting: *Deleted

## 2020-05-10 ENCOUNTER — Encounter (HOSPITAL_COMMUNITY): Payer: Self-pay | Admitting: Obstetrics and Gynecology

## 2020-05-10 ENCOUNTER — Inpatient Hospital Stay (HOSPITAL_COMMUNITY)
Admission: AD | Admit: 2020-05-10 | Discharge: 2020-05-17 | DRG: 788 | Disposition: A | Discharge status: Home / Self Care | Payer: Medicaid Other | Attending: Obstetrics and Gynecology | Admitting: Obstetrics and Gynecology

## 2020-05-10 VITALS — BP 134/79 | HR 90 | Wt 347.0 lb

## 2020-05-10 DIAGNOSIS — O99214 Obesity complicating childbirth: Secondary | ICD-10-CM | POA: Diagnosis present

## 2020-05-10 DIAGNOSIS — F17210 Nicotine dependence, cigarettes, uncomplicated: Secondary | ICD-10-CM | POA: Diagnosis present

## 2020-05-10 DIAGNOSIS — O99210 Obesity complicating pregnancy, unspecified trimester: Secondary | ICD-10-CM | POA: Insufficient documentation

## 2020-05-10 DIAGNOSIS — O1490 Unspecified pre-eclampsia, unspecified trimester: Secondary | ICD-10-CM | POA: Diagnosis present

## 2020-05-10 DIAGNOSIS — Z975 Presence of (intrauterine) contraceptive device: Secondary | ICD-10-CM

## 2020-05-10 DIAGNOSIS — O24420 Gestational diabetes mellitus in childbirth, diet controlled: Secondary | ICD-10-CM | POA: Diagnosis present

## 2020-05-10 DIAGNOSIS — O98812 Other maternal infectious and parasitic diseases complicating pregnancy, second trimester: Secondary | ICD-10-CM | POA: Diagnosis present

## 2020-05-10 DIAGNOSIS — O99331 Smoking (tobacco) complicating pregnancy, first trimester: Secondary | ICD-10-CM | POA: Diagnosis present

## 2020-05-10 DIAGNOSIS — O1404 Mild to moderate pre-eclampsia, complicating childbirth: Secondary | ICD-10-CM | POA: Diagnosis present

## 2020-05-10 DIAGNOSIS — O99824 Streptococcus B carrier state complicating childbirth: Secondary | ICD-10-CM | POA: Diagnosis present

## 2020-05-10 DIAGNOSIS — D563 Thalassemia minor: Secondary | ICD-10-CM | POA: Diagnosis present

## 2020-05-10 DIAGNOSIS — Z2839 Other underimmunization status: Secondary | ICD-10-CM

## 2020-05-10 DIAGNOSIS — O0990 Supervision of high risk pregnancy, unspecified, unspecified trimester: Secondary | ICD-10-CM

## 2020-05-10 DIAGNOSIS — Z30430 Encounter for insertion of intrauterine contraceptive device: Secondary | ICD-10-CM | POA: Diagnosis not present

## 2020-05-10 DIAGNOSIS — O24410 Gestational diabetes mellitus in pregnancy, diet controlled: Secondary | ICD-10-CM

## 2020-05-10 DIAGNOSIS — O24013 Pre-existing diabetes mellitus, type 1, in pregnancy, third trimester: Secondary | ICD-10-CM | POA: Diagnosis not present

## 2020-05-10 DIAGNOSIS — Z3A37 37 weeks gestation of pregnancy: Secondary | ICD-10-CM | POA: Diagnosis not present

## 2020-05-10 DIAGNOSIS — Z20822 Contact with and (suspected) exposure to covid-19: Secondary | ICD-10-CM | POA: Diagnosis present

## 2020-05-10 DIAGNOSIS — O1493 Unspecified pre-eclampsia, third trimester: Secondary | ICD-10-CM | POA: Diagnosis not present

## 2020-05-10 DIAGNOSIS — F1290 Cannabis use, unspecified, uncomplicated: Secondary | ICD-10-CM | POA: Diagnosis present

## 2020-05-10 DIAGNOSIS — A749 Chlamydial infection, unspecified: Secondary | ICD-10-CM | POA: Diagnosis present

## 2020-05-10 DIAGNOSIS — E109 Type 1 diabetes mellitus without complications: Secondary | ICD-10-CM | POA: Diagnosis not present

## 2020-05-10 DIAGNOSIS — O99820 Streptococcus B carrier state complicating pregnancy: Secondary | ICD-10-CM | POA: Diagnosis not present

## 2020-05-10 DIAGNOSIS — O99334 Smoking (tobacco) complicating childbirth: Secondary | ICD-10-CM | POA: Diagnosis present

## 2020-05-10 DIAGNOSIS — O1400 Mild to moderate pre-eclampsia, unspecified trimester: Secondary | ICD-10-CM

## 2020-05-10 DIAGNOSIS — Z23 Encounter for immunization: Secondary | ICD-10-CM

## 2020-05-10 DIAGNOSIS — O1414 Severe pre-eclampsia complicating childbirth: Secondary | ICD-10-CM | POA: Diagnosis present

## 2020-05-10 DIAGNOSIS — O326XX0 Maternal care for compound presentation, not applicable or unspecified: Secondary | ICD-10-CM | POA: Diagnosis not present

## 2020-05-10 DIAGNOSIS — O329XX0 Maternal care for malpresentation of fetus, unspecified, not applicable or unspecified: Secondary | ICD-10-CM | POA: Diagnosis not present

## 2020-05-10 DIAGNOSIS — O9902 Anemia complicating childbirth: Secondary | ICD-10-CM | POA: Diagnosis present

## 2020-05-10 DIAGNOSIS — R112 Nausea with vomiting, unspecified: Secondary | ICD-10-CM | POA: Diagnosis present

## 2020-05-10 DIAGNOSIS — Z3A36 36 weeks gestation of pregnancy: Secondary | ICD-10-CM

## 2020-05-10 DIAGNOSIS — O361990 Maternal care for other isoimmunization, unspecified trimester, not applicable or unspecified: Secondary | ICD-10-CM | POA: Diagnosis present

## 2020-05-10 DIAGNOSIS — O09899 Supervision of other high risk pregnancies, unspecified trimester: Secondary | ICD-10-CM

## 2020-05-10 DIAGNOSIS — O1403 Mild to moderate pre-eclampsia, third trimester: Secondary | ICD-10-CM | POA: Diagnosis not present

## 2020-05-10 DIAGNOSIS — O24419 Gestational diabetes mellitus in pregnancy, unspecified control: Secondary | ICD-10-CM | POA: Diagnosis present

## 2020-05-10 DIAGNOSIS — D649 Anemia, unspecified: Secondary | ICD-10-CM | POA: Diagnosis present

## 2020-05-10 DIAGNOSIS — O361930 Maternal care for other isoimmunization, third trimester, not applicable or unspecified: Secondary | ICD-10-CM

## 2020-05-10 LAB — CBC
HCT: 30.2 % — ABNORMAL LOW (ref 36.0–46.0)
Hemoglobin: 9.6 g/dL — ABNORMAL LOW (ref 12.0–15.0)
MCH: 26.4 pg (ref 26.0–34.0)
MCHC: 31.8 g/dL (ref 30.0–36.0)
MCV: 83 fL (ref 80.0–100.0)
Platelets: 256 10*3/uL (ref 150–400)
RBC: 3.64 MIL/uL — ABNORMAL LOW (ref 3.87–5.11)
RDW: 14.5 % (ref 11.5–15.5)
WBC: 13.3 10*3/uL — ABNORMAL HIGH (ref 4.0–10.5)
nRBC: 0 % (ref 0.0–0.2)

## 2020-05-10 LAB — PROTEIN / CREATININE RATIO, URINE
Creatinine, Urine: 55.46 mg/dL
Protein Creatinine Ratio: 0.43 mg/mg{Cre} — ABNORMAL HIGH (ref 0.00–0.15)
Total Protein, Urine: 24 mg/dL

## 2020-05-10 LAB — COMPREHENSIVE METABOLIC PANEL
ALT: 15 U/L (ref 0–44)
AST: 16 U/L (ref 15–41)
Albumin: 2.8 g/dL — ABNORMAL LOW (ref 3.5–5.0)
Alkaline Phosphatase: 103 U/L (ref 38–126)
Anion gap: 8 (ref 5–15)
BUN: 8 mg/dL (ref 6–20)
CO2: 21 mmol/L — ABNORMAL LOW (ref 22–32)
Calcium: 9.1 mg/dL (ref 8.9–10.3)
Chloride: 109 mmol/L (ref 98–111)
Creatinine, Ser: 0.59 mg/dL (ref 0.44–1.00)
GFR, Estimated: >60 mL/min (ref >60)
Glucose, Bld: 94 mg/dL (ref 70–99)
Potassium: 3.8 mmol/L (ref 3.5–5.1)
Sodium: 138 mmol/L (ref 135–145)
Total Bilirubin: 0.3 mg/dL (ref 0.3–1.2)
Total Protein: 6.9 g/dL (ref 6.5–8.1)

## 2020-05-10 LAB — GLUCOSE, CAPILLARY
Glucose-Capillary: 115 mg/dL — ABNORMAL HIGH (ref 70–99)
Glucose-Capillary: 97 mg/dL (ref 70–99)

## 2020-05-10 MED ORDER — MISOPROSTOL 25 MCG QUARTER TABLET: 25.0000 ug | ORAL_TABLET | Freq: Once | ORAL | Status: AC

## 2020-05-10 MED ORDER — LACTATED RINGERS IV SOLN: 500.0000 mL | INTRAVENOUS | Status: DC | PRN

## 2020-05-10 MED ORDER — SOD CITRATE-CITRIC ACID 500-334 MG/5ML PO SOLN
30.0000 mL | ORAL | Status: DC | PRN
Start: 2020-05-10 — End: 2020-05-14
  Administered 2020-05-14: 30 mL via ORAL
  Filled 2020-05-10: qty 15

## 2020-05-10 MED ORDER — INSULIN ASPART 100 UNIT/ML ~~LOC~~ SOLN: 0.0000 [IU] | Freq: Every day | SUBCUTANEOUS | Status: DC

## 2020-05-10 MED ORDER — ACETAMINOPHEN 325 MG PO TABS
650.0000 mg | ORAL_TABLET | ORAL | Status: DC | PRN
  Administered 2020-05-11 – 2020-05-12 (×4): 650 mg via ORAL
  Filled 2020-05-10 (×4): qty 2

## 2020-05-10 MED ORDER — SODIUM CHLORIDE 0.9 % IV SOLN: 250.0000 mL | INTRAVENOUS | Status: DC | PRN

## 2020-05-10 MED ORDER — MISOPROSTOL 50MCG HALF TABLET
50.0000 ug | ORAL_TABLET | ORAL | Status: DC | PRN
  Administered 2020-05-13 (×2): 50 ug via BUCCAL
  Filled 2020-05-10 (×2): qty 1

## 2020-05-10 MED ORDER — LACTATED RINGERS IV SOLN
INTRAVENOUS | Status: DC
  Administered 2020-05-13: 125 mL/h via INTRAVENOUS

## 2020-05-10 MED ORDER — SODIUM CHLORIDE 0.9% FLUSH
3.0000 mL | Freq: Two times a day (BID) | INTRAVENOUS | Status: DC
  Administered 2020-05-11 – 2020-05-13 (×3): 3 mL via INTRAVENOUS

## 2020-05-10 MED ORDER — INSULIN ASPART 100 UNIT/ML ~~LOC~~ SOLN: 0.0000 [IU] | Freq: Three times a day (TID) | SUBCUTANEOUS | Status: DC

## 2020-05-10 MED ORDER — CALCIUM CARBONATE ANTACID 500 MG PO CHEW: 2.0000 | CHEWABLE_TABLET | ORAL | Status: DC | PRN

## 2020-05-10 MED ORDER — OXYTOCIN BOLUS FROM INFUSION: 333.0000 mL | Freq: Once | INTRAVENOUS | Status: DC

## 2020-05-10 MED ORDER — TERBUTALINE SULFATE 1 MG/ML IJ SOLN: 0.2500 mg | Freq: Once | INTRAMUSCULAR | Status: DC | PRN

## 2020-05-10 MED ORDER — FENTANYL CITRATE (PF) 100 MCG/2ML IJ SOLN
50.0000 ug | INTRAMUSCULAR | Status: DC | PRN
  Administered 2020-05-13 (×2): 100 ug via INTRAVENOUS
  Filled 2020-05-10 (×2): qty 2

## 2020-05-10 MED ORDER — SODIUM CHLORIDE 0.9% FLUSH: 3.0000 mL | INTRAVENOUS | Status: DC | PRN

## 2020-05-10 MED ORDER — PRENATAL MULTIVITAMIN CH
1.0000 | ORAL_TABLET | Freq: Every day | ORAL | Status: DC
  Administered 2020-05-11: 1 via ORAL
  Filled 2020-05-10: qty 1

## 2020-05-10 MED ORDER — ZOLPIDEM TARTRATE 5 MG PO TABS
5.0000 mg | ORAL_TABLET | Freq: Every evening | ORAL | Status: DC | PRN
  Administered 2020-05-10: 5 mg via ORAL
  Filled 2020-05-10: qty 1

## 2020-05-10 MED ORDER — ONDANSETRON HCL 4 MG/2ML IJ SOLN
4.0000 mg | Freq: Four times a day (QID) | INTRAMUSCULAR | Status: DC | PRN
Start: 2020-05-10 — End: 2020-05-14

## 2020-05-10 MED ORDER — MISOPROSTOL 25 MCG QUARTER TABLET
ORAL_TABLET | ORAL | Status: AC
  Administered 2020-05-10: 25 ug via VAGINAL
  Filled 2020-05-10: qty 1

## 2020-05-10 MED ORDER — LIDOCAINE HCL (PF) 1 % IJ SOLN
30.0000 mL | INTRAMUSCULAR | Status: DC | PRN
Start: 2020-05-10 — End: 2020-05-14

## 2020-05-10 MED ORDER — SODIUM CHLORIDE 0.9 % IV SOLN
5.0000 10*6.[IU] | Freq: Once | INTRAVENOUS | Status: AC
  Administered 2020-05-10: 5 10*6.[IU] via INTRAVENOUS
  Filled 2020-05-10: qty 5

## 2020-05-10 MED ORDER — DOCUSATE SODIUM 100 MG PO CAPS
100.0000 mg | ORAL_CAPSULE | Freq: Every day | ORAL | Status: DC
  Administered 2020-05-11 – 2020-05-12 (×2): 100 mg via ORAL
  Filled 2020-05-10 (×2): qty 1

## 2020-05-10 MED ORDER — OXYTOCIN-SODIUM CHLORIDE 30-0.9 UT/500ML-% IV SOLN
2.5000 [IU]/h | INTRAVENOUS | Status: DC
  Filled 2020-05-10: qty 500

## 2020-05-10 MED ORDER — PENICILLIN G POT IN DEXTROSE 60000 UNIT/ML IV SOLN: 3.0000 10*6.[IU] | INTRAVENOUS | Status: DC

## 2020-05-10 NOTE — Progress Notes (Signed)
Discussed pt with OB team for the evening.  At this point pt does not meet severe criteria.  Pt discussed in detail with Dr. Parke Poisson, MFM.  He felt it would be reasonable for continued observation at this time since she is not severe.  If patient does develop end organ manifestations or severe range pressures, pt could be moved back to L and D for delivery.  As it stands she is scheduled for delivery on 4/21.  Pt will be transferred to antepartum for further observation.  Mariel Aloe, MD

## 2020-05-10 NOTE — Telephone Encounter (Signed)
Preadmission screen  

## 2020-05-10 NOTE — H&P (Addendum)
OBSTETRIC ADMISSION HISTORY AND PHYSICAL  Jacqueline Shepherd is a 30 y.o. female G2P0010 with IUP at [redacted]w[redacted]d by early [place] (6w) presenting for IOL for pre-eclampsia with symptoms concerning for PEC with severe features. She presented for routine OB visit today and had increased LE swelling over the last week and 2 days of intermittent blurry vision with "black spots in her vision." BP was measured at 134/79 so she was sent here for IOL. [name] asymptomatic currently. Denies blurry vision, headaches or RUQ pain.  She reports +FMs, No LOF, no VB. She plans on breast and bottle feeding. She request IUD for birth control. She received her prenatal care at [facility]   Dating: By 6 wk [place] --->  Estimated Date of Delivery: 06/03/20  Sono:    '@[redacted]w[redacted]d'$ , normal anatomy, cephalic presentation, AFI 57% lie, 2610 g, 48% EFW BPP 8/8   Prenatal History/Complications:  Preeclampsia (Pr/Cr 0.37 on 4/15)  A1GDM  Obesity  Hyperemesis gravidarum Cannabinoid hyperemesis syndrome  Previous tobacco use in pregnancy  Alpha thalassemia silent carrier  Lewis isoimmunization during pregnancy Remote history of chlamydia (neg tox)  Rubella NI   Past Medical History: Past Medical History:  Diagnosis Date  . Abdominal pain   . Cannabinoid hyperemesis syndrome   . Chlamydia infection complicating pregnancy in second trimester 08/18/2018   Negative test of cure on 08/06/18  . Transient hypertension of pregnancy 10/12/2019  . Weight loss 10/12/2019    Past Surgical History: Past Surgical History:  Procedure Laterality Date  . WISDOM TOOTH EXTRACTION      Obstetrical History: OB History    Gravida  2   Para      Term      Preterm      AB  1   Living        SAB  1   IAB      Ectopic      Multiple      Live Births              Social History Social History   Socioeconomic History  . Marital status: Single    Spouse name: Not on file  . Number of children: Not on file  . Years of education: Not  on file  . Highest education level: Not on file  Occupational History  . Not on file  Tobacco Use  . Smoking status: Current Some Day Smoker    Packs/day: 0.25    Last attempt to quit: 01/22/2020    Years since quitting: 0.2  . Smokeless tobacco: Never Used  Vaping Use  . Vaping Use: Never used  Substance and Sexual Activity  . Alcohol use: No  . Drug use: Yes    Types: Marijuana    Comment: 05/06/20  . Sexual activity: Not Currently    Partners: Male    Birth control/protection: None    Comment: Pregnant   Other Topics Concern  . Not on file  Social History Narrative  . Not on file   Social Determinants of Health   Financial Resource Strain: Not on file  Food Insecurity: Food Insecurity Present  . Worried About Charity fundraiser in the Last Year: Often true  . Ran Out of Food in the Last Year: Often true  Transportation Needs: No Transportation Needs  . Lack of Transportation (Medical): No  . Lack of Transportation (Non-Medical): No  Physical Activity: Not on file  Stress: Not on file  Social Connections: Not on file  Family History: Family History  Problem Relation Age of Onset  . Diabetes Mother   . Hypertension Mother     Allergies: No Known Allergies  Medications Prior to Admission  Medication Sig Dispense Refill Last Dose  . aspirin EC 81 MG tablet Take 1 tablet (81 mg total) by mouth daily. Swallow whole. 30 tablet 11 05/09/2020 at Unknown time  . Prenatal Vit-Fe Fumarate-FA (PREPLUS) 27-1 MG TABS Take 1 tablet by mouth daily. 30 tablet 13 05/09/2020 at Unknown time  . Accu-Chek Softclix Lancets lancets Please check blood sugar up to four times per day 100 each 1   . Blood Pressure Monitor KIT 1 Device by Does not apply route once a week. To be monitored Regularly at home. 1 kit 0   . famotidine (PEPCID) 20 MG tablet Take 1 tablet (20 mg total) by mouth 2 (two) times daily. (Patient not taking: Reported on 05/10/2020) 30 tablet 2   . glucose blood test  strip Use as instructed 100 each 12   . metoCLOPramide (REGLAN) 10 MG tablet Take 1 tablet (10 mg total) by mouth every 6 (six) hours. (Patient not taking: Reported on 05/10/2020) 30 tablet 2   . ondansetron (ZOFRAN ODT) 4 MG disintegrating tablet Take 1 tablet (4 mg total) by mouth every 6 (six) hours as needed for nausea. (Patient not taking: Reported on 05/10/2020) 30 tablet 3   . Prenatal Vit-Fe Phos-FA-Omega (VITAFOL GUMMIES) 3.33-0.333-34.8 MG CHEW Chew 3 each by mouth daily. (Patient not taking: Reported on 05/10/2020) 90 tablet 3   . promethazine (PHENERGAN) 25 MG suppository Place 1 suppository (25 mg total) rectally every 6 (six) hours as needed for nausea or vomiting. (Patient not taking: Reported on 05/10/2020) 12 each 2   . scopolamine (TRANSDERM-SCOP) 1 MG/3DAYS Place 1 patch (1.5 mg total) onto the skin every 3 (three) days. (Patient not taking: Reported on 05/10/2020) 10 patch 12      Review of Systems   All systems reviewed and negative except as stated in HPI  Blood pressure (!) 114/58, pulse (!) 102, temperature 98.8 F (37.1 C), temperature source Oral, resp. rate 17, height $RemoveBe'5\' 6"'loodIrFNS$  (1.676 m), weight (!) 156.2 kg, last menstrual period 09/17/2019, SpO2 100 %. General appearance: alert and no distress Lungs: normal WOB Heart: regular rate Abdomen: soft, non-tender Pelvic: 1, thick, -3 Neuro: 2+ reflexes, no clonus Extremities: Trace LE edema. no sign of DVT Presentation: cephalic Fetal monitoringBaseline: 135 bpm, Variability: Good {> 6 bpm), Accelerations: Reactive and Decelerations: Absent Uterine activity: Frequency: Mild, Every 10 minutes Dilation: 1 Effacement (%): Thick Station: -3 Exam by:: Dr Berniece Andreas   Prenatal labs: ABO, Rh: --/--/PENDING (04/18 1743) Antibody: PENDING (04/18 1743) Rubella: <0.90 (10/19 1355) RPR: Non Reactive (03/09 1215)  HBsAg: Negative (10/19 1355)  HIV: Non Reactive (03/09 1215)  GBS:   culture pending  2 hr Glucola  96*/135/111 Genetic screening: [place] showed silent carrier of alpha thalassemia, increased risk of SMA, Panorama with low risk Anatomy US normal  Prenatal Transfer Tool  Maternal Diabetes: Yes:  Diabetes Type:  Diet controlled Genetic Screening: Normal Maternal Ultrasounds/Referrals: Normal Fetal Ultrasounds or other Referrals:  Referred to Materal Fetal Medicine  Maternal Substance Abuse:  No  Significant Maternal Medications:  Meds include: Other:  81 mg ASA, Pepcid, Zofran, Phenergan Significant Maternal Lab Results: Other:  GBS unknown  Results for orders placed or performed during the hospital encounter of 05/10/20 (from the past 24 hour(s))  Type and screen   Collection Time: 05/10/20  5:43 PM  Result Value Ref Range   ABO/RH(D) PENDING    Antibody Screen PENDING    Sample Expiration      05/13/2020,2359 Performed at [facility], [place] [address] [place], [place] [address]     Patient Active Problem List   Diagnosis Date Noted  . Preeclampsia 05/10/2020  . Antepartum mild preeclampsia 05/07/2020  . Carpal tunnel syndrome during pregnancy 04/15/2020  . Gestational diabetes mellitus (GDM), antepartum 04/13/2020  . Dysuria during pregnancy in second trimester 02/23/2020  . Abnormal TSH 12/09/2019  . Alpha thalassemia silent carrier 11/24/2019  . Hyperemesis gravidarum, antepartum 11/22/2019  . Tobacco use in pregnancy, antepartum, first trimester 11/11/2019  . Other social stressor 11/11/2019  . 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 Obesity in pregnancy 10/12/2019  . BMI 40.0-44.9, adult ([place]) 10/12/2019  . Cannabinoid hyperemesis syndrome   . Supervision of high risk pregnancy, antepartum 10/06/2019  . Chlamydia infection affecting pregnancy in second trimester 08/18/2018  . Rubella non-immune status, antepartum 08/18/2018    Assessment/Plan:  Maely JAZSMINE MACARI is a 30 y.o. G2P0010 at [redacted]w[redacted]d here for IOL-gHTN w/ symptoms concerning for severe  features.   Initially admitted for IOL for PEC w SF. Given one dose of cytotec. Discussed case with Dr. Elgie Congo and Dr. Annamaria Boots - given asymptomatic at this time and <37 weeks, decision made to NOT proceed with induction. Will plan for admission to [facility] specialty care for observation and induction at 37 weeks. Low threshold to bring back to L&D should she demonstrate any signs/symptoms of severe features. Dr. Elgie Congo discussed this with patient and family at bedside and they are in agreement with plan. To [facility] specialty care for admission.   #PEC: Pr/Cr 0.37 on 4/15. Sent for blurry vision/scotomas/LE edema. Asymptomatic currently so hold Mg for now.   - Low threshold to start Mg if shows sx again  #ID: GBS unknown, culture pending   - start PCN  #Rubella NI: MMR postpartum  #MOF: breast/bottle  #MOC: IUD postpartum  #Circ:  NA  Jule Economy, Medical Student  05/10/2020, 6:36 PM   I saw and evaluated the patient and repeated all pertinent components of HPI and exam. I have made edits above as necessary.  I agree with the findings and the plan of care as documented in the medical student's note.  Sharene Skeans, MD [facility] Family Medicine Fellow, [facility], [place]

## 2020-05-10 NOTE — Progress Notes (Signed)
PRENATAL VISIT NOTE  Subjective:  Jacqueline Shepherd is a 30 y.o. G2P0010 at 61w4dbeing seen today for ongoing prenatal care.  [name] currently monitored for the following issues for this high-risk pregnancy and has Chlamydia infection affecting pregnancy in second trimester; Rubella non-immune status, antepartum; Supervision of high risk pregnancy, antepartum; Cannabinoid hyperemesis syndrome; Obesity in pregnancy; BMI 40.0-44.9, adult ([place]; Lewis isoimmunization during pregnancy; Tobacco use in pregnancy, antepartum, first trimester; Other social stressor; Hyperemesis gravidarum, antepartum; Alpha thalassemia silent carrier; Abnormal TSH; Dysuria during pregnancy in second trimester; Gestational diabetes mellitus (GDM), antepartum; Carpal tunnel syndrome during pregnancy; and Antepartum mild preeclampsia on their problem list.  Patient reports blurred vision and seeing bright spots when her BP is elevated for the past 2 days. Denies headaches, cp, sob. Also endorses LE edema x1 week.  Contractions: Not present. Vag. Bleeding: None.  Movement: Present. Denies leaking of fluid.   The following portions of the patient's history were reviewed and updated as appropriate: allergies, current medications, past family history, past medical history, past social history, past surgical history and problem list.   Objective:   Vitals:   05/10/20 1433  BP: 134/79  Pulse: 90  Weight: (!) 347 lb (157.4 kg)    Fetal Status: Fetal Heart Rate (bpm): 160   Movement: Present     General:  Alert, oriented and cooperative. Patient is in no acute distress.  Skin: Skin is warm and dry. No rash noted.   Cardiovascular: Normal heart rate noted  Respiratory: Normal respiratory effort, no problems with respiration noted  Abdomen: Soft, gravid, appropriate for gestational age.  Pain/Pressure: Present     Pelvic: Cervical exam deferred        Extremities: Normal range of motion.     Mental Status: Normal mood and  affect. Normal behavior. Normal judgment and thought content.   Assessment and Plan:  Pregnancy: G2P0010 at 382w4d. Antepartum mild preeclampsia Prior diagnosis, now endorses vision changes x2 days (blurred vision and scotoma). Also endorses LE edema and has had a 10 pound weight gain in the last 1 week. Denies other symptoms. Discussed with Dr. WoSi Raidernd decision made for direct admission to L&D for preeclampsia with severe features. preE labs on admit.  2. Supervision of high risk pregnancy, antepartum Send to L&D for IOL, as noted above  3. Diet controlled gestational diabetes mellitus (GDM), antepartum Did not discuss BGL logs today. BPP 8/8.  4. Alpha thalassemia silent carrier  5. Lewis isoimmunization during pregnancy in third trimester, single or unspecified fetus  6. Chlamydia infection affecting pregnancy in second trimester Neg toc.  7. Rubella non-immune status, antepartum MMR postpartum  Preterm labor symptoms and general obstetric precautions including but not limited to vaginal bleeding, contractions, leaking of fluid and fetal movement were reviewed in detail with the patient. Please refer to After Visit Summary for other counseling recommendations.   No follow-ups on file.  Future Appointments  Date Time Provider DeAspinwall4/19/2022 10:15 AM MC-SCREENING MC-SDSC None  05/13/2020 12:00 AM MC-LD SCHED ROOM MC-INDC None    AlArrie SenateMD

## 2020-05-11 ENCOUNTER — Other Ambulatory Visit (HOSPITAL_COMMUNITY): Payer: Medicaid Other | Attending: Family Medicine

## 2020-05-11 DIAGNOSIS — O1403 Mild to moderate pre-eclampsia, third trimester: Secondary | ICD-10-CM

## 2020-05-11 DIAGNOSIS — Z3A36 36 weeks gestation of pregnancy: Secondary | ICD-10-CM | POA: Diagnosis not present

## 2020-05-11 LAB — GLUCOSE, CAPILLARY
Glucose-Capillary: 79 mg/dL (ref 70–99)
Glucose-Capillary: 95 mg/dL (ref 70–99)
Glucose-Capillary: 96 mg/dL (ref 70–99)
Glucose-Capillary: 105 mg/dL — ABNORMAL HIGH (ref 70–99)

## 2020-05-11 LAB — RPR: RPR Ser Ql: NONREACTIVE

## 2020-05-11 LAB — SARS CORONAVIRUS 2 (TAT 6-24 HRS): SARS Coronavirus 2: NEGATIVE

## 2020-05-11 MED ORDER — BUTALBITAL-APAP-CAFFEINE 50-325-40 MG PO TABS
1.0000 | ORAL_TABLET | ORAL | Status: DC | PRN
  Administered 2020-05-11 – 2020-05-12 (×3): 1 via ORAL
  Filled 2020-05-11 (×4): qty 1

## 2020-05-11 NOTE — Progress Notes (Signed)
FACULTY PRACTICE ANTEPARTUM PROGRESS NOTE  Jacqueline Shepherd is a 30 y.o. G2P0010 at [redacted]w[redacted]d who is admitted for induction of labor due to mild preeclampsia, no severe features.  Estimated Date of Delivery: 06/03/20 Fetal presentation is cephalic.  Length of Stay:  1 Days. Admitted 05/10/2020  Subjective: Pt received one dose of cytotec with minimal progress.  IOL stopped as patient did not show severe features. This morning the pt is doing well.  She denies headache, visual changes or RUQ pain. Patient reports normal fetal movement.  She denies uterine contractions, denies bleeding and leaking of fluid per vagina.  Vitals:  Blood pressure 129/62, pulse 90, temperature 98.1 F (36.7 C), temperature source Oral, resp. rate 20, height 5\' 6"  (1.676 m), weight (!) 156.2 kg, last menstrual period 09/17/2019, SpO2 100 %. Physical Examination: CONSTITUTIONAL: Well-developed, well-nourished female in no acute distress.  HENT:  Normocephalic, atraumatic, External right and left ear normal. Oropharynx is clear and moist EYES: Conjunctivae and EOM are normal. No scleral icterus.  NECK: Normal range of motion, supple, no masses. SKIN: Skin is warm and dry. No rash noted. Not diaphoretic. No erythema. No pallor. NEUROLGIC: Alert and oriented to person, place, and time. Normal reflexes, muscle tone coordination. No cranial nerve deficit noted. PSYCHIATRIC: Normal mood and affect. Normal behavior. Normal judgment and thought content. CARDIOVASCULAR: Normal heart rate noted, regular rhythm RESPIRATORY: Effort and breath sounds normal, no problems with respiration noted MUSCULOSKELETAL: Normal range of motion. No edema and no tenderness. ABDOMEN: Soft, nontender, nondistended, gravid. CERVIX: deferred  Fetal monitoring: FHR: 130s bpm, Variability: moderate, Accelerations: Present, Decelerations: Absent  Uterine activity: irregular   Results for orders placed or performed during the hospital encounter of  05/10/20 (from the past 48 hour(s))  CBC     Status: Abnormal   Collection Time: 05/10/20  5:43 PM  Result Value Ref Range   WBC 13.3 (H) 4.0 - 10.5 K/uL   RBC 3.64 (L) 3.87 - 5.11 MIL/uL   Hemoglobin 9.6 (L) 12.0 - 15.0 g/dL   HCT 05/12/20 (L) 62.9 - 52.8 %   MCV 83.0 80.0 - 100.0 fL   MCH 26.4 26.0 - 34.0 pg   MCHC 31.8 30.0 - 36.0 g/dL   RDW 41.3 24.4 - 01.0 %   Platelets 256 150 - 400 K/uL   nRBC 0.0 0.0 - 0.2 %    Comment: Performed at [facility], [address], [place], [place]  Type and screen     Status: None (Preliminary result)   Collection Time: 05/10/20  5:43 PM  Result Value Ref Range   ABO/RH(D) B POS    Antibody Screen POS    Sample Expiration 05/13/2020,2359    Antibody Identification      ANTI LEA 05/15/2020 a) Performed at [facility], [address], [place], [place]    Unit Number [id number]    Blood Component Type RED CELLS,LR    Unit division 00    Status of Unit ALLOCATED    Donor AG Type NEGATIVE FOR LEWIS A ANTIGEN    Transfusion Status OK TO TRANSFUSE    Crossmatch Result COMPATIBLE    Unit Number [id number]    Blood Component Type RED CELLS,LR    Unit division 00    Status of Unit ALLOCATED    Donor AG Type NEGATIVE FOR LEWIS A ANTIGEN    Transfusion Status OK TO TRANSFUSE    Crossmatch Result COMPATIBLE   Comprehensive metabolic panel  Status: Abnormal   Collection Time: 05/10/20  5:43 PM  Result Value Ref Range   Sodium 138 135 - 145 mmol/L   Potassium 3.8 3.5 - 5.1 mmol/L   Chloride 109 98 - 111 mmol/L   CO2 21 (L) 22 - 32 mmol/L   Glucose, Bld 94 70 - 99 mg/dL    Comment: Glucose reference range applies only to samples taken after fasting for at least 8 hours.   BUN 8 6 - 20 mg/dL   Creatinine, Ser 6.76 0.44 - 1.00 mg/dL   Calcium 9.1 8.9 - 19.5 mg/dL   Total Protein 6.9 6.5 - 8.1 g/dL   Albumin 2.8 (L) 3.5 - 5.0 g/dL   AST 16 15 - 41 U/L   ALT 15 0 - 44 U/L   Alkaline Phosphatase 103 38 - 126  U/L   Total Bilirubin 0.3 0.3 - 1.2 mg/dL   GFR, Estimated >09 >32 mL/min    Comment: (NOTE) Calculated using the CKD-EPI Creatinine Equation (2021)    Anion gap 8 5 - 15    Comment: Performed at [facility], [address], [place], [place] [address]  Protein / creatinine ratio, urine     Status: Abnormal   Collection Time: 05/10/20  5:43 PM  Result Value Ref Range   Creatinine, Urine 55.46 mg/dL   Total Protein, Urine 24 mg/dL    Comment: NO NORMAL RANGE ESTABLISHED FOR THIS TEST   Protein Creatinine Ratio 0.43 (H) 0.00 - 0.15 mg/mg[Cre]    Comment: Performed at [facility], [address] [place], [place] [address]  SARS CORONAVIRUS 2 (TAT 6-24 HRS) Nasopharyngeal     Status: None   Collection Time: 05/10/20  7:16 PM   Specimen: Nasopharyngeal  Result Value Ref Range   SARS Coronavirus 2 NEGATIVE NEGATIVE    Comment: (NOTE) SARS-CoV-2 target nucleic acids are NOT DETECTED.  The SARS-CoV-2 RNA is generally detectable in upper and lower respiratory specimens during the acute phase of infection. Negative results do not preclude SARS-CoV-2 infection, do not rule out co-infections with other pathogens, and should not be used as the sole basis for treatment or other patient management decisions. Negative results must be combined with clinical observations, patient history, and epidemiological information. The expected result is Negative.  Fact Sheet for Patients: HairSlick.no  Fact Sheet for Healthcare Providers: quierodirigir.com  This test is not yet approved or cleared by the [place] FDA and  has been authorized for detection and/or diagnosis of SARS-CoV-2 by FDA under an Emergency Use Authorization (EUA). This EUA will remain  in effect (meaning this test can be used) for the duration of the COVID-19 declaration under Se ction 564(b)(1) of the Act, 21 U.S.C. section 360bbb-3(b)(1), unless the  authorization is terminated or revoked sooner.  Performed at [facility], [address], [place], [place] [address]   Glucose, capillary     Status: Abnormal   Collection Time: 05/10/20  8:23 PM  Result Value Ref Range   Glucose-Capillary 115 (H) 70 - 99 mg/dL    Comment: Glucose reference range applies only to samples taken after fasting for at least 8 hours.  Glucose, capillary     Status: None   Collection Time: 05/10/20 10:17 PM  Result Value Ref Range   Glucose-Capillary 97 70 - 99 mg/dL    Comment: Glucose reference range applies only to samples taken after fasting for at least 8 hours.    I have reviewed the  patient's current medications.  ASSESSMENT: Active Problems:   Preeclampsia Blood pressure currently normotensive.  No signs of severe preeclampsia  PLAN: Continue inpatient stay with planned start of IOL at midnight 05/13/20 Follow blood sugars and continue diabetic diet.   Continue routine antenatal care.   Mariel Aloe, MD [facility] Faculty Attending, Center for [facility] 05/11/2020 7:30 AM

## 2020-05-11 NOTE — Plan of Care (Signed)
  Problem: Education: Goal: Knowledge of Childbirth will improve Outcome: Not Applicable Goal: Ability to make informed decisions regarding treatment and plan of care will improve Outcome: Not Applicable Goal: Ability to state and carry out methods to decrease the pain will improve Outcome: Not Applicable Goal: Individualized Educational Video(s) Outcome: Not Applicable   Problem: Coping: Goal: Ability to verbalize concerns and feelings about labor and delivery will improve Outcome: Not Applicable   Problem: Life Cycle: Goal: Ability to make normal progression through stages of labor will improve Outcome: Not Applicable Goal: Ability to effectively push during vaginal delivery will improve Outcome: Not Applicable   Problem: Role Relationship: Goal: Will demonstrate positive interactions with the child Outcome: Not Applicable   Problem: Safety: Goal: Risk of complications during labor and delivery will decrease Outcome: Not Applicable   Problem: Pain Management: Goal: Relief or control of pain from uterine contractions will improve Outcome: Not Applicable

## 2020-05-11 NOTE — Progress Notes (Signed)
Patient has been eating since admission to unit.  Explained to patient about carb modified diet and CBGs that is ordered and patient stated she is not a diabetic that she eats any thing that she wants including  Ice cream and cakes.  Explained the pros and cons of GDM, and she stated "I am not a diabetic and never was".

## 2020-05-12 DIAGNOSIS — E109 Type 1 diabetes mellitus without complications: Secondary | ICD-10-CM | POA: Diagnosis not present

## 2020-05-12 DIAGNOSIS — O1493 Unspecified pre-eclampsia, third trimester: Secondary | ICD-10-CM

## 2020-05-12 DIAGNOSIS — O24013 Pre-existing diabetes mellitus, type 1, in pregnancy, third trimester: Secondary | ICD-10-CM

## 2020-05-12 LAB — CULTURE, BETA STREP (GROUP B ONLY)

## 2020-05-12 LAB — GLUCOSE, CAPILLARY
Glucose-Capillary: 98 mg/dL (ref 70–99)
Glucose-Capillary: 91 mg/dL (ref 70–99)
Glucose-Capillary: 90 mg/dL (ref 70–99)
Glucose-Capillary: 91 mg/dL (ref 70–99)

## 2020-05-12 MED ORDER — COMPLETENATE 29-1 MG PO CHEW
1.0000 | CHEWABLE_TABLET | Freq: Every day | ORAL | Status: DC
  Administered 2020-05-12: 1 via ORAL
  Filled 2020-05-12 (×3): qty 1

## 2020-05-12 NOTE — Progress Notes (Signed)
Patient ID: Jacqueline Shepherd, female   DOB: 03/25/1990, 30 y.o.   MRN: [id number] ACULTY PRACTICE ANTEPARTUM COMPREHENSIVE PROGRESS NOTE  Jacqueline Shepherd is a 30 y.o. G2P0010 at [redacted]w[redacted]d  who is admitted for mild preeclampsia and IOL on 05/13/20.   Fetal presentation is cephalic. Length of Stay:  2  Days  Subjective: Pt has no complaints this morning. She denies HA or visual changes.  Did have an elevated BP early this morning but pt was upset. Repeat not in severe range Patient reports good fetal movement.  She reports no uterine contractions, no bleeding and no loss of fluid per vagina.  Vitals:  Blood pressure (!) 149/89, pulse 96, temperature 98.2 F (36.8 C), temperature source Oral, resp. rate 18, height 5\' 6"  (1.676 m), weight (!) 156.2 kg, last menstrual period 09/17/2019, SpO2 100 %.   Physical Examination: Lungs clear Heart RRR Abd soft + BS gravid Ext non tender    Labs:  Results for orders placed or performed during the hospital encounter of 05/10/20 (from the past 24 hour(s))  Glucose, capillary   Collection Time: 05/11/20 11:55 AM  Result Value Ref Range   Glucose-Capillary 95 70 - 99 mg/dL  Glucose, capillary   Collection Time: 05/11/20  5:17 PM  Result Value Ref Range   Glucose-Capillary 96 70 - 99 mg/dL  Glucose, capillary   Collection Time: 05/11/20 10:20 PM  Result Value Ref Range   Glucose-Capillary 105 (H) 70 - 99 mg/dL  Glucose, capillary   Collection Time: 05/12/20  8:50 AM  Result Value Ref Range   Glucose-Capillary 98 70 - 99 mg/dL    Imaging Studies:    NA   Medications:  Scheduled . docusate sodium  100 mg Oral Daily  . insulin aspart  0-15 Units Subcutaneous TID WC  . insulin aspart  0-5 Units Subcutaneous QHS  . oxytocin 40 units in LR 1000 mL  333 mL Intravenous Once  . prenatal vitamin w/FE, FA  1 tablet Oral Q1200  . sodium chloride flush  3 mL Intravenous Q12H   I have reviewed the patient's current medications.  ASSESSMENT: IUP 36  6/7 weeks Mild PEC A1DM Rubella, non immune  PLAN: BP stable. No S/Sx of SPEC. CBG's in range. For IOL at midnight pending availability in L & D.  Continue routine antenatal care.   05/14/20 05/12/2020,11:26 AM

## 2020-05-13 ENCOUNTER — Encounter (HOSPITAL_COMMUNITY): Payer: Medicaid Other

## 2020-05-13 ENCOUNTER — Inpatient Hospital Stay (HOSPITAL_COMMUNITY)
Admission: AD | Admit: 2020-05-13 | Payer: Medicaid Other | Source: Home / Self Care | Admitting: Obstetrics & Gynecology

## 2020-05-13 ENCOUNTER — Inpatient Hospital Stay (HOSPITAL_COMMUNITY): Payer: Medicaid Other

## 2020-05-13 LAB — COMPREHENSIVE METABOLIC PANEL
ALT: 18 U/L (ref 0–44)
AST: 16 U/L (ref 15–41)
Albumin: 2.7 g/dL — ABNORMAL LOW (ref 3.5–5.0)
Alkaline Phosphatase: 90 U/L (ref 38–126)
Anion gap: 7 (ref 5–15)
BUN: 10 mg/dL (ref 6–20)
CO2: 21 mmol/L — ABNORMAL LOW (ref 22–32)
Calcium: 9 mg/dL (ref 8.9–10.3)
Chloride: 108 mmol/L (ref 98–111)
Creatinine, Ser: 0.61 mg/dL (ref 0.44–1.00)
GFR, Estimated: >60 mL/min (ref >60)
Glucose, Bld: 100 mg/dL — ABNORMAL HIGH (ref 70–99)
Potassium: 3.9 mmol/L (ref 3.5–5.1)
Sodium: 136 mmol/L (ref 135–145)
Total Bilirubin: 0.1 mg/dL — ABNORMAL LOW (ref 0.3–1.2)
Total Protein: 6.1 g/dL — ABNORMAL LOW (ref 6.5–8.1)

## 2020-05-13 LAB — CBC
HCT: 30 % — ABNORMAL LOW (ref 36.0–46.0)
Hemoglobin: 9.5 g/dL — ABNORMAL LOW (ref 12.0–15.0)
MCH: 26.5 pg (ref 26.0–34.0)
MCHC: 31.7 g/dL (ref 30.0–36.0)
MCV: 83.8 fL (ref 80.0–100.0)
Platelets: 197 10*3/uL (ref 150–400)
RBC: 3.58 MIL/uL — ABNORMAL LOW (ref 3.87–5.11)
RDW: 15 % (ref 11.5–15.5)
WBC: 11.6 10*3/uL — ABNORMAL HIGH (ref 4.0–10.5)
nRBC: 0 % (ref 0.0–0.2)

## 2020-05-13 LAB — GLUCOSE, CAPILLARY
Glucose-Capillary: 87 mg/dL (ref 70–99)
Glucose-Capillary: 130 mg/dL — ABNORMAL HIGH (ref 70–99)
Glucose-Capillary: 101 mg/dL — ABNORMAL HIGH (ref 70–99)
Glucose-Capillary: 90 mg/dL (ref 70–99)
Glucose-Capillary: 116 mg/dL — ABNORMAL HIGH (ref 70–99)
Glucose-Capillary: 108 mg/dL — ABNORMAL HIGH (ref 70–99)

## 2020-05-13 LAB — PROTEIN / CREATININE RATIO, URINE
Creatinine, Urine: 10.04 mg/dL
Total Protein, Urine: <6 mg/dL

## 2020-05-13 MED ORDER — LABETALOL HCL 5 MG/ML IV SOLN: 80.0000 mg | INTRAVENOUS | Status: DC | PRN

## 2020-05-13 MED ORDER — LABETALOL HCL 5 MG/ML IV SOLN: 20.0000 mg | INTRAVENOUS | Status: DC | PRN

## 2020-05-13 MED ORDER — MAGNESIUM SULFATE 40 GM/1000ML IV SOLN
2.0000 g/h | INTRAVENOUS | Status: DC
  Administered 2020-05-14 – 2020-05-15 (×2): 2 g/h via INTRAVENOUS
  Filled 2020-05-13 (×2): qty 1000

## 2020-05-13 MED ORDER — MISOPROSTOL 25 MCG QUARTER TABLET
ORAL_TABLET | ORAL | Status: AC
  Administered 2020-05-13: 25 ug via VAGINAL
  Filled 2020-05-13: qty 1

## 2020-05-13 MED ORDER — MISOPROSTOL 25 MCG QUARTER TABLET: 25.0000 ug | ORAL_TABLET | ORAL | Status: DC | PRN

## 2020-05-13 MED ORDER — HYDRALAZINE HCL 20 MG/ML IJ SOLN: 10.0000 mg | INTRAMUSCULAR | Status: DC | PRN

## 2020-05-13 MED ORDER — SODIUM CHLORIDE 0.9 % IV SOLN
5.0000 10*6.[IU] | Freq: Once | INTRAVENOUS | Status: AC
  Administered 2020-05-13: 5 10*6.[IU] via INTRAVENOUS
  Filled 2020-05-13: qty 5

## 2020-05-13 MED ORDER — MISOPROSTOL 50MCG HALF TABLET
50.0000 ug | ORAL_TABLET | ORAL | Status: DC
  Administered 2020-05-13 – 2020-05-14 (×2): 50 ug via ORAL
  Filled 2020-05-13 (×2): qty 1

## 2020-05-13 MED ORDER — BUTORPHANOL TARTRATE 1 MG/ML IJ SOLN
1.0000 mg | INTRAMUSCULAR | Status: DC | PRN
  Administered 2020-05-13 – 2020-05-14 (×6): 1 mg via INTRAVENOUS
  Filled 2020-05-13 (×6): qty 1

## 2020-05-13 MED ORDER — LABETALOL HCL 5 MG/ML IV SOLN: 40.0000 mg | INTRAVENOUS | Status: DC | PRN

## 2020-05-13 MED ORDER — MAGNESIUM SULFATE 40 GM/1000ML IV SOLN
INTRAVENOUS | Status: AC
  Filled 2020-05-13: qty 1000

## 2020-05-13 MED ORDER — MAGNESIUM SULFATE BOLUS VIA INFUSION
4.0000 g | Freq: Once | INTRAVENOUS | Status: AC
  Administered 2020-05-13: 4 g via INTRAVENOUS
  Filled 2020-05-13: qty 1000

## 2020-05-13 MED ORDER — ACETAMINOPHEN 500 MG PO TABS
1000.0000 mg | ORAL_TABLET | Freq: Four times a day (QID) | ORAL | Status: DC | PRN
  Administered 2020-05-13 – 2020-05-14 (×3): 1000 mg via ORAL
  Filled 2020-05-13 (×3): qty 2

## 2020-05-13 MED ORDER — BUTORPHANOL TARTRATE 1 MG/ML IJ SOLN
INTRAMUSCULAR | Status: AC
  Administered 2020-05-13: 1 mg via INTRAVENOUS
  Filled 2020-05-13: qty 1

## 2020-05-13 MED ORDER — PENICILLIN G POT IN DEXTROSE 60000 UNIT/ML IV SOLN
3.0000 10*6.[IU] | INTRAVENOUS | Status: DC
  Administered 2020-05-13 – 2020-05-14 (×7): 3 10*6.[IU] via INTRAVENOUS
  Filled 2020-05-13 (×8): qty 50

## 2020-05-13 NOTE — Progress Notes (Signed)
Labor Progress Note Jacqueline Shepherd is a 30 y.o. G2P0010 at [redacted]w[redacted]d presented for IOL for PEC  S:  Comfortable, no c/o.   O:  BP 134/79   Pulse 87   Temp 99.3 F (37.4 C) (Oral)   Resp 18   Ht 5\' 6"  (1.676 m)   Wt (!) 156.2 kg   LMP 09/17/2019   SpO2 100%   BMI 55.56 kg/m  EFM: baseline 135 bpm/ mod variability/ no accels/ no decels  Toco/IUPC: irregular SVE: 1/50/-3; anterior  A/P: 30 y.o. G2P0010 [redacted]w[redacted]d  1. Labor: latent 2. FWB: Cat I 3. Pain: analgesia/anesthesia prn 4. A1GDM: stable 5. PEC: stable  S/p Cytotec x4. Consented for FB, attempted w/o success. Will premedicate and attempt placement with speculum.  Anticipate labor progress and SVD.  [redacted]w[redacted]d, CNM 11:38 AM

## 2020-05-13 NOTE — Progress Notes (Addendum)
Labor Progress Note Jacqueline Shepherd is a 30 y.o. G2P0010 at [redacted]w[redacted]d presented for IOL-preE w/o SF.  S: Having increased pain with contractions. Also complains of headache, rates 9/10. Denies blurry vision. Feels overwhelmed and ready to have baby. Thinks this is contributing to headache also.   O:  BP 130/81   Pulse 91   Temp 99.3 F (37.4 C) (Oral)   Resp 18   Ht $R'5\' 6"'AU$  (1.676 m)   Wt (!) 156.2 kg   LMP 09/17/2019   SpO2 100%   BMI 55.56 kg/m  EFM: baseline 145bpm/mod variability/+accels/no decels Toco: 2-3 minutes, moderate   CVE: deferred  Most recent exam:  Dilation: 1 Effacement (%): 50 Cervical Position: Posterior Station: -3 Presentation: Vertex Exam by:: K Faucett RN   A&P: 30 y.o. G2P0010 [redacted]w[redacted]d presented for IOL-preE w/o SF. Now having symptoms concerning for severe features.  #IOL: Initially direct admitted from clinic 4/18 for preE with headaches and vision changes, upon presentation was asymptomatic and decision made to defer IOL to 37w. S/p cyto x1 at that time. Patient transferred from antepartum overnight for IOL and since has received cyto x4, last at 1032. Exam unchanged; Cook balloon placed $RemoveBefore'@1249'pLghZPLDpdRuN$ .   #Pain: PRN, plans for epidural. 100 mcg fentanyl during placement of cook balloon.  #FWB: cat 1 #GBS positive, PCN #preE w/ SF: p/c 0.37 on admit, was having HA/blurry vision. Now having increasing HA unresponsive to analgesia. Will start Mg infusion and CTM.   #A1GDM: q4 CBG in latent labor and q2 in active labor. EFW wnl.  - glucose 90 $RemoveBe'@1425'OxzxAvnHl$  #BMI 56 #Rubella NI: MMR postpartum #Anemia of pregnancy: admit hgb 9.6, asymptomatic. PO vs IV iron postpartum.  Jule Economy, Medical Student 4:09 PM  Midwife attestation I agree with the documentation in the student's note.   Julianne Handler, CNM 4:24 PM

## 2020-05-13 NOTE — Progress Notes (Signed)
Labor Progress Note Jacqueline Shepherd is a 30 y.o. G2P0010 at 55w0dpresented for IOL-preE w/o SF.  S: Doing well overall. Feeling contractions. Headache resolved with tylenol.   O:  BP 131/77 (BP Location: Left Arm)   Pulse 97   Temp 99.1 F (37.3 C) (Oral)   Resp 18   Ht '5\' 6"'  (1.676 m)   Wt (!) 156.2 kg   LMP 09/17/2019   SpO2 100%   BMI 55.56 kg/m  EFM: baseline 140bpm/mod variability/+accels/no decels Toco: irritable, difficult to trace  CVE: Dilation: 1 Effacement (%): 30 Cervical Position: Posterior Station: -3 Presentation: Vertex Exam by:: Dr. FSylvester Harder  A&P: 30y.o. G2P0010 325w0dresented for IOL-preE w/o SF. #IOL: Initially direct admitted from clinic 4/18 for preE with headaches and vision changes, upon presentation was asymptomatic and decision made to defer IOL to 37w. S/p cyto x1 at that time. Patient transferred from antepartum overnight for IOL and since has received cyto x1. Will re-dose cytotec and place FB when able. #Pain: PRN #FWB: cat 1 #GBS positive, PCN #preE w/o SF: p/c 0.37 on admit, currently having headache and vision changes. Will dose tylenol 1000 mg and if headache does not resolve will start Mg. Will repeat preE labs.  #A1GDM: q4 CBG in latent labor and q2 in active labor. EFW wnl. #BMI 5644Rubella NI: MMR postpartum #Anemia of pregnancy: admit hgb 9.6, asymptomatic. PO vs IV iron postpartum.  AlArrie SenateMD 5:27 AM

## 2020-05-13 NOTE — Progress Notes (Signed)
Labor Progress Note Jacqueline Shepherd is a 30 y.o. G2P0010 at 5w0dpresented for IOL-preE w/o SF.  S: Doing well overall. Transferred from antepartum. Complaining of headache currently. Denies cp, sob, palpitations, LE edema.   O:  BP 131/77 (BP Location: Left Arm)   Pulse 97   Temp 98 F (36.7 C) (Oral)   Resp 18   Ht '5\' 6"'  (1.676 m)   Wt (!) 156.2 kg   LMP 09/17/2019   SpO2 100%   BMI 55.56 kg/m  EFM: baseline 135bpm/mod variability/+accels/no decels Toco: irritable  CVE: Dilation: 1 Effacement (%): Thick Cervical Position: Posterior Station: -3 Presentation: Vertex Exam by:: Dr. FSylvester Harder  A&P: 30y.o. G2P0010 362w0dresented for IOL-preE w/o SF. #IOL: Initially direct admitted from clinic 4/18 for preE with headaches and vision changes, upon presentation was asymptomatic and decision made to defer IOL to 37w. S/p cyto x1. Given cervical exam will re-dose cyto 50 mcg buccal and attempt FB at next check if appropriate. #Pain: PRN #FWB: cat 1 #GBS positive, PCN #preE w/o SF: p/c 0.37 on admit, currently having headache and vision changes. Will dose tylenol 1000 mg and if headache does not resolve will start Mg. Will repeat preE labs.  #A1GDM: q4 CBG in latent labor and q2 in active labor. EFW wnl. #BMI 5667Rubella NI: MMR postpartum #Anemia of pregnancy: admit hgb 9.6, asymptomatic. PO vs IV iron postpartum.  AlArrie SenateMD 1:01 AM

## 2020-05-13 NOTE — Progress Notes (Addendum)
Labor Progress Note Jacqueline Shepherd is a 30 y.o. G2P0010 at [redacted]w[redacted]d presented for IOL-preE w/o SF.  S: Doing well overall. Increased urine frequency without dysuria. Likely due to baby's positioning. Feeling pressure.   O:  BP 132/63   Pulse 91   Temp 99.3 F (37.4 C) (Oral)   Resp 18   Ht $R'5\' 6"'aZ$  (1.676 m)   Wt (!) 156.2 kg   LMP 09/17/2019   SpO2 100%   BMI 55.56 kg/m  EFM: baseline 140bpm/mod variability/+accels/no decels Toco: irritable, difficult to trace  CVE: Dilation: 1 Effacement (%): 50 Cervical Position: Posterior Station: -3 Presentation: Vertex Exam by:: K Faucett RN   A&P: 30 y.o. G2P0010 [redacted]w[redacted]d presented for IOL-preE w/o SF. #IOL: Initially direct admitted from clinic 4/18 for preE with headaches and vision changes, upon presentation was asymptomatic and decision made to defer IOL to 37w. S/p cyto x1 at that time. Patient transferred from antepartum overnight for IOL and since has received cyto x3, last at 1032. Exam unchanged; Cook balloon placed $RemoveBefore'@1249'fTNaXdniGAceD$ .   #Pain: PRN, plans for epidural. 100 mcg fentanyl during placement of cook balloon.  #FWB: cat 1 #GBS positive, PCN #preE w/o SF: p/c 0.37 on admit, was having HA/blurry vision. Asymptomatic currenty. No proteinuria on repeat labs  #A1GDM: q4 CBG in latent labor and q2 in active labor. EFW wnl.  - POCT glucose 101 $RemoveBef'@1053'jYvqPmZTDB$  #BMI 56 #Rubella NI: MMR postpartum #Anemia of pregnancy: admit hgb 9.6, asymptomatic. PO vs IV iron postpartum.  Jule Economy, Medical Student 10:57 AM  Midwife attestation I agree with the documentation in the student's note.   Julianne Handler, CNM 2:33 PM

## 2020-05-14 ENCOUNTER — Encounter (HOSPITAL_COMMUNITY): Payer: Self-pay | Admitting: Obstetrics & Gynecology

## 2020-05-14 ENCOUNTER — Inpatient Hospital Stay (HOSPITAL_COMMUNITY): Payer: Medicaid Other | Admitting: Anesthesiology

## 2020-05-14 ENCOUNTER — Encounter (HOSPITAL_COMMUNITY)
Admission: AD | Disposition: A | Discharge status: Home / Self Care | Payer: Self-pay | Source: Home / Self Care | Attending: Obstetrics and Gynecology

## 2020-05-14 DIAGNOSIS — Z30430 Encounter for insertion of intrauterine contraceptive device: Secondary | ICD-10-CM

## 2020-05-14 DIAGNOSIS — O1414 Severe pre-eclampsia complicating childbirth: Secondary | ICD-10-CM

## 2020-05-14 DIAGNOSIS — O326XX0 Maternal care for compound presentation, not applicable or unspecified: Secondary | ICD-10-CM | POA: Diagnosis not present

## 2020-05-14 DIAGNOSIS — O24420 Gestational diabetes mellitus in childbirth, diet controlled: Secondary | ICD-10-CM

## 2020-05-14 DIAGNOSIS — O329XX0 Maternal care for malpresentation of fetus, unspecified, not applicable or unspecified: Secondary | ICD-10-CM | POA: Diagnosis not present

## 2020-05-14 DIAGNOSIS — Z3A37 37 weeks gestation of pregnancy: Secondary | ICD-10-CM

## 2020-05-14 DIAGNOSIS — O99820 Streptococcus B carrier state complicating pregnancy: Secondary | ICD-10-CM

## 2020-05-14 LAB — TYPE AND SCREEN
ABO/RH(D): B POS
Antibody Screen: POSITIVE
Donor AG Type: NEGATIVE
Donor AG Type: NEGATIVE
Unit division: 0
Unit division: 0

## 2020-05-14 LAB — CBC
HCT: 29.8 % — ABNORMAL LOW (ref 36.0–46.0)
Hemoglobin: 9.4 g/dL — ABNORMAL LOW (ref 12.0–15.0)
MCH: 26.3 pg (ref 26.0–34.0)
MCHC: 31.5 g/dL (ref 30.0–36.0)
MCV: 83.5 fL (ref 80.0–100.0)
Platelets: 201 10*3/uL (ref 150–400)
RBC: 3.57 MIL/uL — ABNORMAL LOW (ref 3.87–5.11)
RDW: 15.3 % (ref 11.5–15.5)
WBC: 12.8 10*3/uL — ABNORMAL HIGH (ref 4.0–10.5)
nRBC: 0 % (ref 0.0–0.2)

## 2020-05-14 LAB — COMPREHENSIVE METABOLIC PANEL
ALT: 17 U/L (ref 0–44)
AST: 17 U/L (ref 15–41)
Albumin: 2.7 g/dL — ABNORMAL LOW (ref 3.5–5.0)
Alkaline Phosphatase: 101 U/L (ref 38–126)
Anion gap: 6 (ref 5–15)
BUN: 8 mg/dL (ref 6–20)
CO2: 22 mmol/L (ref 22–32)
Calcium: 7.8 mg/dL — ABNORMAL LOW (ref 8.9–10.3)
Chloride: 107 mmol/L (ref 98–111)
Creatinine, Ser: 0.62 mg/dL (ref 0.44–1.00)
GFR, Estimated: >60 mL/min (ref >60)
Glucose, Bld: 118 mg/dL — ABNORMAL HIGH (ref 70–99)
Potassium: 4.2 mmol/L (ref 3.5–5.1)
Sodium: 135 mmol/L (ref 135–145)
Total Bilirubin: 0.1 mg/dL — ABNORMAL LOW (ref 0.3–1.2)
Total Protein: 6.4 g/dL — ABNORMAL LOW (ref 6.5–8.1)

## 2020-05-14 LAB — GLUCOSE, CAPILLARY
Glucose-Capillary: 133 mg/dL — ABNORMAL HIGH (ref 70–99)
Glucose-Capillary: 132 mg/dL — ABNORMAL HIGH (ref 70–99)
Glucose-Capillary: 130 mg/dL — ABNORMAL HIGH (ref 70–99)
Glucose-Capillary: 104 mg/dL — ABNORMAL HIGH (ref 70–99)
Glucose-Capillary: 107 mg/dL — ABNORMAL HIGH (ref 70–99)
Glucose-Capillary: 103 mg/dL — ABNORMAL HIGH (ref 70–99)
Glucose-Capillary: 86 mg/dL (ref 70–99)

## 2020-05-14 LAB — BPAM RBC
Blood Product Expiration Date: 202205082359
Blood Product Expiration Date: 202205082359
Unit Type and Rh: 7300
Unit Type and Rh: 7300

## 2020-05-14 SURGERY — Surgical Case
Anesthesia: Spinal | Wound class: Clean Contaminated

## 2020-05-14 MED ORDER — DEXAMETHASONE SODIUM PHOSPHATE 4 MG/ML IJ SOLN
INTRAMUSCULAR | Status: DC | PRN
  Administered 2020-05-14: 8 mg via INTRAVENOUS

## 2020-05-14 MED ORDER — HYDROMORPHONE HCL 2 MG PO TABS
1.0000 mg | ORAL_TABLET | ORAL | Status: DC | PRN
Start: 2020-05-14 — End: 2020-05-17
  Administered 2020-05-16 (×2): 1 mg via ORAL
  Filled 2020-05-14 (×2): qty 1

## 2020-05-14 MED ORDER — MORPHINE SULFATE (PF) 0.5 MG/ML IJ SOLN
INTRAMUSCULAR | Status: AC
  Filled 2020-05-14: qty 10

## 2020-05-14 MED ORDER — PHENYLEPHRINE HCL (PRESSORS) 10 MG/ML IV SOLN
INTRAVENOUS | Status: DC | PRN
  Administered 2020-05-14: 160 ug via INTRAVENOUS
  Administered 2020-05-14: 80 ug via INTRAVENOUS
  Administered 2020-05-14: 120 ug via INTRAVENOUS
  Administered 2020-05-14: 200 ug via INTRAVENOUS

## 2020-05-14 MED ORDER — SENNOSIDES-DOCUSATE SODIUM 8.6-50 MG PO TABS
2.0000 | ORAL_TABLET | Freq: Every day | ORAL | Status: DC
  Administered 2020-05-15 – 2020-05-17 (×3): 2 via ORAL
  Filled 2020-05-14 (×3): qty 2

## 2020-05-14 MED ORDER — OXYCODONE HCL 5 MG PO TABS
5.0000 mg | ORAL_TABLET | ORAL | Status: DC | PRN
  Administered 2020-05-15 – 2020-05-17 (×10): 10 mg via ORAL
  Filled 2020-05-14 (×10): qty 2

## 2020-05-14 MED ORDER — DIPHENHYDRAMINE HCL 25 MG PO CAPS: 25.0000 mg | ORAL_CAPSULE | Freq: Four times a day (QID) | ORAL | Status: DC | PRN

## 2020-05-14 MED ORDER — WITCH HAZEL-GLYCERIN EX PADS: 1.0000 "application " | MEDICATED_PAD | CUTANEOUS | Status: DC | PRN

## 2020-05-14 MED ORDER — MEPERIDINE HCL 25 MG/ML IJ SOLN: 6.2500 mg | INTRAMUSCULAR | Status: DC | PRN

## 2020-05-14 MED ORDER — SIMETHICONE 80 MG PO CHEW
80.0000 mg | CHEWABLE_TABLET | Freq: Three times a day (TID) | ORAL | Status: DC
  Administered 2020-05-15 – 2020-05-17 (×7): 80 mg via ORAL
  Filled 2020-05-14 (×7): qty 1

## 2020-05-14 MED ORDER — LACTATED RINGERS IV SOLN: INTRAVENOUS | Status: DC

## 2020-05-14 MED ORDER — FENTANYL CITRATE (PF) 100 MCG/2ML IJ SOLN
INTRAMUSCULAR | Status: AC
  Filled 2020-05-14: qty 2

## 2020-05-14 MED ORDER — OXYTOCIN-SODIUM CHLORIDE 30-0.9 UT/500ML-% IV SOLN
INTRAVENOUS | Status: AC
  Filled 2020-05-14: qty 500

## 2020-05-14 MED ORDER — ZOLPIDEM TARTRATE 5 MG PO TABS
5.0000 mg | ORAL_TABLET | Freq: Every evening | ORAL | Status: DC | PRN
Start: 2020-05-14 — End: 2020-05-17

## 2020-05-14 MED ORDER — SOD CITRATE-CITRIC ACID 500-334 MG/5ML PO SOLN: 30.0000 mL | ORAL | Status: DC

## 2020-05-14 MED ORDER — CEFAZOLIN IN SODIUM CHLORIDE 3-0.9 GM/100ML-% IV SOLN
3.0000 g | INTRAVENOUS | Status: AC
  Administered 2020-05-14: 3 g via INTRAVENOUS
  Filled 2020-05-14: qty 100

## 2020-05-14 MED ORDER — PHENYLEPHRINE 40 MCG/ML (10ML) SYRINGE FOR IV PUSH (FOR BLOOD PRESSURE SUPPORT)
PREFILLED_SYRINGE | INTRAVENOUS | Status: AC
  Filled 2020-05-14: qty 10

## 2020-05-14 MED ORDER — SODIUM CHLORIDE 0.9 % IR SOLN
Status: DC | PRN
  Administered 2020-05-14: 600 mL

## 2020-05-14 MED ORDER — ONDANSETRON HCL 4 MG/2ML IJ SOLN
4.0000 mg | Freq: Three times a day (TID) | INTRAMUSCULAR | Status: DC | PRN
  Administered 2020-05-15: 4 mg via INTRAVENOUS
  Filled 2020-05-14: qty 2

## 2020-05-14 MED ORDER — INSULIN REGULAR(HUMAN) IN NACL 100-0.9 UT/100ML-% IV SOLN
INTRAVENOUS | Status: DC
  Filled 2020-05-14 (×2): qty 100

## 2020-05-14 MED ORDER — NALBUPHINE HCL 10 MG/ML IJ SOLN: 5.0000 mg | INTRAMUSCULAR | Status: DC | PRN

## 2020-05-14 MED ORDER — OXYTOCIN-SODIUM CHLORIDE 30-0.9 UT/500ML-% IV SOLN
INTRAVENOUS | Status: DC | PRN
  Administered 2020-05-14: 300 mL via INTRAVENOUS

## 2020-05-14 MED ORDER — ONDANSETRON HCL 4 MG/2ML IJ SOLN
INTRAMUSCULAR | Status: AC
  Filled 2020-05-14: qty 2

## 2020-05-14 MED ORDER — DEXAMETHASONE SODIUM PHOSPHATE 4 MG/ML IJ SOLN
INTRAMUSCULAR | Status: AC
  Filled 2020-05-14: qty 2

## 2020-05-14 MED ORDER — OXYTOCIN-SODIUM CHLORIDE 30-0.9 UT/500ML-% IV SOLN: 2.5000 [IU]/h | INTRAVENOUS | Status: AC

## 2020-05-14 MED ORDER — EPHEDRINE SULFATE 50 MG/ML IJ SOLN
INTRAMUSCULAR | Status: DC | PRN
  Administered 2020-05-14: 10 mg via INTRAVENOUS

## 2020-05-14 MED ORDER — NALBUPHINE HCL 10 MG/ML IJ SOLN
5.0000 mg | Freq: Once | INTRAMUSCULAR | Status: DC | PRN
Start: 2020-05-14 — End: 2020-05-17

## 2020-05-14 MED ORDER — CEFAZOLIN IN SODIUM CHLORIDE 3-0.9 GM/100ML-% IV SOLN
INTRAVENOUS | Status: AC
  Filled 2020-05-14: qty 100

## 2020-05-14 MED ORDER — BUPIVACAINE HCL (PF) 0.5 % IJ SOLN
INTRAMUSCULAR | Status: DC | PRN
  Administered 2020-05-14: 30 mL

## 2020-05-14 MED ORDER — SODIUM CHLORIDE 0.9% FLUSH: 3.0000 mL | INTRAVENOUS | Status: DC | PRN

## 2020-05-14 MED ORDER — ONDANSETRON HCL 4 MG/2ML IJ SOLN
INTRAMUSCULAR | Status: DC | PRN
  Administered 2020-05-14: 4 mg via INTRAVENOUS

## 2020-05-14 MED ORDER — KETOROLAC TROMETHAMINE 30 MG/ML IJ SOLN: 30.0000 mg | Freq: Four times a day (QID) | INTRAMUSCULAR | Status: AC | PRN

## 2020-05-14 MED ORDER — TETANUS-DIPHTH-ACELL PERTUSSIS 5-2.5-18.5 LF-MCG/0.5 IM SUSY
0.5000 mL | PREFILLED_SYRINGE | Freq: Once | INTRAMUSCULAR | Status: AC
  Administered 2020-05-15: 0.5 mL via INTRAMUSCULAR
  Filled 2020-05-14: qty 0.5

## 2020-05-14 MED ORDER — PHENYLEPHRINE HCL-NACL 20-0.9 MG/250ML-% IV SOLN
INTRAVENOUS | Status: AC
  Filled 2020-05-14: qty 250

## 2020-05-14 MED ORDER — PRENATAL MULTIVITAMIN CH
1.0000 | ORAL_TABLET | Freq: Every day | ORAL | Status: DC
  Administered 2020-05-15 – 2020-05-16 (×2): 1 via ORAL
  Filled 2020-05-14 (×2): qty 1

## 2020-05-14 MED ORDER — BUPIVACAINE IN DEXTROSE 0.75-8.25 % IT SOLN
INTRATHECAL | Status: DC | PRN
  Administered 2020-05-14: 1.8 mL via INTRATHECAL

## 2020-05-14 MED ORDER — MORPHINE SULFATE (PF) 0.5 MG/ML IJ SOLN
INTRAMUSCULAR | Status: DC | PRN
  Administered 2020-05-14: 150 ug via INTRATHECAL

## 2020-05-14 MED ORDER — NALBUPHINE HCL 10 MG/ML IJ SOLN: 5.0000 mg | Freq: Once | INTRAMUSCULAR | Status: DC | PRN

## 2020-05-14 MED ORDER — ACETAMINOPHEN 500 MG PO TABS
1000.0000 mg | ORAL_TABLET | Freq: Four times a day (QID) | ORAL | Status: DC
  Administered 2020-05-14 – 2020-05-17 (×10): 1000 mg via ORAL
  Filled 2020-05-14 (×12): qty 2

## 2020-05-14 MED ORDER — MENTHOL 3 MG MT LOZG: 1.0000 | LOZENGE | OROMUCOSAL | Status: DC | PRN

## 2020-05-14 MED ORDER — NALOXONE HCL 4 MG/10ML IJ SOLN
1.0000 ug/kg/h | INTRAVENOUS | Status: DC | PRN
  Filled 2020-05-14: qty 5

## 2020-05-14 MED ORDER — FENTANYL CITRATE (PF) 100 MCG/2ML IJ SOLN
INTRAMUSCULAR | Status: DC | PRN
  Administered 2020-05-14: 15 ug via INTRATHECAL

## 2020-05-14 MED ORDER — ENOXAPARIN SODIUM 80 MG/0.8ML ~~LOC~~ SOLN
80.0000 mg | SUBCUTANEOUS | Status: DC
  Administered 2020-05-15 – 2020-05-17 (×3): 80 mg via SUBCUTANEOUS
  Filled 2020-05-14 (×3): qty 0.8

## 2020-05-14 MED ORDER — FENTANYL CITRATE (PF) 100 MCG/2ML IJ SOLN
25.0000 ug | INTRAMUSCULAR | Status: DC | PRN
  Administered 2020-05-14 (×2): 50 ug via INTRAVENOUS

## 2020-05-14 MED ORDER — LEVONORGESTREL 19.5 MCG/DAY IU IUD
INTRAUTERINE_SYSTEM | Freq: Once | INTRAUTERINE | Status: AC
  Filled 2020-05-14: qty 1

## 2020-05-14 MED ORDER — KETOROLAC TROMETHAMINE 30 MG/ML IJ SOLN
30.0000 mg | Freq: Four times a day (QID) | INTRAMUSCULAR | Status: AC | PRN
  Administered 2020-05-15: 30 mg via INTRAVENOUS
  Filled 2020-05-14: qty 1

## 2020-05-14 MED ORDER — OXYTOCIN-SODIUM CHLORIDE 30-0.9 UT/500ML-% IV SOLN
1.0000 m[IU]/min | INTRAVENOUS | Status: DC
  Administered 2020-05-14: 2 m[IU]/min via INTRAVENOUS

## 2020-05-14 MED ORDER — DIPHENHYDRAMINE HCL 25 MG PO CAPS: 25.0000 mg | ORAL_CAPSULE | ORAL | Status: DC | PRN

## 2020-05-14 MED ORDER — DIBUCAINE (PERIANAL) 1 % EX OINT: 1.0000 "application " | TOPICAL_OINTMENT | CUTANEOUS | Status: DC | PRN

## 2020-05-14 MED ORDER — EPHEDRINE 5 MG/ML INJ
INTRAVENOUS | Status: AC
  Filled 2020-05-14: qty 10

## 2020-05-14 MED ORDER — BUPIVACAINE HCL (PF) 0.5 % IJ SOLN
INTRAMUSCULAR | Status: AC
  Filled 2020-05-14: qty 30

## 2020-05-14 MED ORDER — COCONUT OIL OIL: 1.0000 "application " | TOPICAL_OIL | Status: DC | PRN

## 2020-05-14 MED ORDER — TERBUTALINE SULFATE 1 MG/ML IJ SOLN: 0.2500 mg | Freq: Once | INTRAMUSCULAR | Status: DC | PRN

## 2020-05-14 MED ORDER — PHENYLEPHRINE HCL-NACL 20-0.9 MG/250ML-% IV SOLN
INTRAVENOUS | Status: DC | PRN
  Administered 2020-05-14: 60 ug/min via INTRAVENOUS

## 2020-05-14 MED ORDER — NALOXONE HCL 0.4 MG/ML IJ SOLN: 0.4000 mg | INTRAMUSCULAR | Status: DC | PRN

## 2020-05-14 MED ORDER — TRANEXAMIC ACID-NACL 1000-0.7 MG/100ML-% IV SOLN
INTRAVENOUS | Status: DC | PRN
  Administered 2020-05-14: 1000 mg via INTRAVENOUS

## 2020-05-14 MED ORDER — DEXTROSE IN LACTATED RINGERS 5 % IV SOLN: INTRAVENOUS | Status: DC

## 2020-05-14 MED ORDER — DIPHENHYDRAMINE HCL 50 MG/ML IJ SOLN: 12.5000 mg | INTRAMUSCULAR | Status: DC | PRN

## 2020-05-14 MED ORDER — METOCLOPRAMIDE HCL 5 MG/ML IJ SOLN
INTRAMUSCULAR | Status: AC
  Filled 2020-05-14: qty 2

## 2020-05-14 MED ORDER — TRANEXAMIC ACID-NACL 1000-0.7 MG/100ML-% IV SOLN
INTRAVENOUS | Status: AC
  Filled 2020-05-14: qty 100

## 2020-05-14 MED ORDER — DEXTROSE 50 % IV SOLN: 0.0000 mL | INTRAVENOUS | Status: DC | PRN

## 2020-05-14 SURGICAL SUPPLY — 39 items
BENZOIN TINCTURE PRP APPL 2/3 (GAUZE/BANDAGES/DRESSINGS) IMPLANT
CANISTER WOUND CARE 500ML ATS (WOUND CARE) ×2 IMPLANT
CHLORAPREP W/TINT 26ML (MISCELLANEOUS) ×2 IMPLANT
CLAMP CORD UMBIL (MISCELLANEOUS) IMPLANT
CLOTH BEACON ORANGE TIMEOUT ST (SAFETY) ×2 IMPLANT
DRESSING PREVENA PLUS CUSTOM (GAUZE/BANDAGES/DRESSINGS) ×1 IMPLANT
DRSG OPSITE POSTOP 4X10 (GAUZE/BANDAGES/DRESSINGS) ×2 IMPLANT
DRSG PREVENA PLUS CUSTOM (GAUZE/BANDAGES/DRESSINGS) ×2
ELECT REM PT RETURN 9FT ADLT (ELECTROSURGICAL) ×2
ELECTRODE REM PT RTRN 9FT ADLT (ELECTROSURGICAL) ×1 IMPLANT
EXTRACTOR VACUUM BELL STYLE (SUCTIONS) IMPLANT
GLOVE BIOGEL PI IND STRL 6.5 (GLOVE) ×1 IMPLANT
GLOVE BIOGEL PI IND STRL 7.0 (GLOVE) ×2 IMPLANT
GLOVE BIOGEL PI INDICATOR 6.5 (GLOVE) ×1
GLOVE BIOGEL PI INDICATOR 7.0 (GLOVE) ×2
GLOVE ORTHOPEDIC STR SZ6.5 (GLOVE) ×2 IMPLANT
GOWN STRL REUS W/TWL LRG LVL3 (GOWN DISPOSABLE) ×6 IMPLANT
KIT ABG SYR 3ML LUER SLIP (SYRINGE) IMPLANT
MAT PREVALON FULL STRYKER (MISCELLANEOUS) ×2 IMPLANT
NEEDLE HYPO 22GX1.5 SAFETY (NEEDLE) ×2 IMPLANT
NEEDLE HYPO 25X1 1.5 SAFETY (NEEDLE) IMPLANT
NS IRRIG 1000ML POUR BTL (IV SOLUTION) ×2 IMPLANT
PACK C SECTION WH (CUSTOM PROCEDURE TRAY) ×2 IMPLANT
PAD OB MATERNITY 4.3X12.25 (PERSONAL CARE ITEMS) ×2 IMPLANT
PENCIL SMOKE EVAC W/HOLSTER (ELECTROSURGICAL) ×2 IMPLANT
RETAINER VISCERAL (MISCELLANEOUS) ×2 IMPLANT
RETRACTOR TRAXI PANNICULUS (MISCELLANEOUS) ×1 IMPLANT
STRIP CLOSURE SKIN 1/2X4 (GAUZE/BANDAGES/DRESSINGS) IMPLANT
SUT MON AB 4-0 PS1 27 (SUTURE) ×2 IMPLANT
SUT PLAIN 2 0 (SUTURE) ×2
SUT PLAIN ABS 2-0 CT1 27XMFL (SUTURE) ×1 IMPLANT
SUT VIC AB 0 CT1 36 (SUTURE) ×4 IMPLANT
SUT VIC AB 0 CTX 36 (SUTURE) ×2
SUT VIC AB 0 CTX36XBRD ANBCTRL (SUTURE) ×1 IMPLANT
SYR CONTROL 10ML LL (SYRINGE) ×2 IMPLANT
TOWEL OR 17X24 6PK STRL BLUE (TOWEL DISPOSABLE) ×2 IMPLANT
TRAXI PANNICULUS RETRACTOR (MISCELLANEOUS) ×1
TRAY FOLEY W/BAG SLVR 14FR LF (SET/KITS/TRAYS/PACK) ×2 IMPLANT
WATER STERILE IRR 1000ML POUR (IV SOLUTION) ×2 IMPLANT

## 2020-05-14 NOTE — Discharge Summary (Signed)
Postpartum Discharge Summary     Patient Name: Jacqueline Shepherd DOB: Dec 21, 1990 MRN: [id number]  Date of admission: 05/10/2020 Delivery date:05/14/2020  Delivering provider: Sloan Leiter  Date of discharge: 05/17/2020  Admitting diagnosis: Preeclampsia [O14.90] Intrauterine pregnancy: [redacted]w[redacted]d     Secondary diagnosis:  Active Problems:   Chlamydia infection affecting pregnancy in second trimester   Rubella non-immune status, antepartum   Supervision of high risk pregnancy, antepartum   Cannabinoid hyperemesis syndrome   Obesity in pregnancy   Lewis isoimmunization during pregnancy   Tobacco use in pregnancy, antepartum, first trimester   Alpha thalassemia silent carrier   Gestational diabetes mellitus (GDM), antepartum   Preeclampsia   Fetal malpresentation   IUD (intrauterine device) in place  Additional problems: none    Discharge diagnosis: Term Pregnancy Delivered, Preeclampsia (severe) and GDM A1                                              Post partum procedures:none Augmentation: AROM, Pitocin, Cytotec and IP Foley Complications: None  Hospital course: Induction of Labor With Cesarean Section   30 y.o. yo G2P1011 at [redacted]w[redacted]d was admitted to the hospital 05/10/2020 for induction of labor. Patient had a labor course significant for hand below the head several times which was able to be reduced. Eventually, hand not able to be reduced and head not palpable. The patient went for cesarean section due to Malpresentation. Delivery details are as follows: Membrane Rupture Time/Date: 12:20 PM ,05/14/2020   Delivery Method:C-Section, Low Transverse  Details of operation can be found in separate operative Note.    Due to preeclampsia with severe features, she was given IV magnesium x 24hr postpartum.  She was started on Procardia for BP control.  [name] ambulating, tolerating a regular diet, passing flatus, and urinating well.  Patient is discharged home in stable condition on  05/17/20.      Newborn Data: Birth date:05/14/2020  Birth time:6:26 PM  Gender:Female  Living status:Living  Apgars:8 ,9  Weight:2650 g                                 Magnesium Sulfate received: Yes: Seizure prophylaxis BMZ received: No Rhophylac:N/A MMR:Yes T-DaP:Given postpartum Flu: No Transfusion:No  Physical exam  Vitals:   05/17/20 0000 05/17/20 0002 05/17/20 0450 05/17/20 0451  BP:  (!) 146/89  (!) 135/55  Pulse:  77  79  Resp:    20  Temp:    98.3 F (36.8 C)  TempSrc:    Oral  SpO2: 97%  98% 97%  Weight:      Height:       General: alert, cooperative and no distress Lochia: appropriate Uterine Fundus: firm, appropriately tender Incision: Prevena dressing is clean, dry, and intact DVT Evaluation: No evidence of DVT seen on physical exam. Labs: Lab Results  Component Value Date   WBC 15.2 (H) 05/15/2020   HGB 8.5 (L) 05/15/2020   HCT 26.8 (L) 05/15/2020   MCV 83.2 05/15/2020   PLT 178 05/15/2020   CMP Latest Ref Rng & Units 05/14/2020  Glucose 70 - 99 mg/dL 118(H)  BUN 6 - 20 mg/dL 8  Creatinine 0.44 - 1.00 mg/dL 0.62  Sodium 135 - 145 mmol/L 135  Potassium 3.5 - 5.1 mmol/L 4.2  Chloride 98 - 111 mmol/L 107  CO2 22 - 32 mmol/L 22  Calcium 8.9 - 10.3 mg/dL 7.8(L)  Total Protein 6.5 - 8.1 g/dL 6.4(L)  Total Bilirubin 0.3 - 1.2 mg/dL 0.1(L)  Alkaline Phos 38 - 126 U/L 101  AST 15 - 41 U/L 17  ALT 0 - 44 U/L 17   Edinburgh Score: No flowsheet data found.   After visit meds:  Allergies as of 05/17/2020   No Known Allergies     Medication List    STOP taking these medications   aspirin EC 81 MG tablet   famotidine 20 MG tablet Commonly known as: PEPCID   metoCLOPramide 10 MG tablet Commonly known as: REGLAN   ondansetron 4 MG disintegrating tablet Commonly known as: Zofran ODT   promethazine 25 MG suppository Commonly known as: PHENERGAN   scopolamine 1 MG/3DAYS Commonly known as: TRANSDERM-SCOP   Vitafol Gummies 3.33-0.333-34.8  MG Chew     TAKE these medications   Accu-Chek Softclix Lancets lancets Please check blood sugar up to four times per day   acetaminophen 500 MG tablet Commonly known as: TYLENOL Take 1 tablet (500 mg total) by mouth every 6 (six) hours as needed.   Blood Pressure Monitor Kit 1 Device by Does not apply route once a week. To be monitored Regularly at home.   ferrous sulfate 325 (65 FE) MG tablet Take 1 tablet (325 mg total) by mouth every other day.   glucose blood test strip Use as instructed   ibuprofen 600 MG tablet Commonly known as: ADVIL Take 1 tablet (600 mg total) by mouth every 6 (six) hours.   NIFEdipine 60 MG 24 hr tablet Commonly known as: ADALAT CC Take 1 tablet (60 mg total) by mouth daily.   oxyCODONE 5 MG immediate release tablet Commonly known as: Oxy IR/ROXICODONE Take 1 tablet (5 mg total) by mouth every 6 (six) hours as needed for up to 7 days for severe pain or breakthrough pain.   PrePLUS 27-1 MG Tabs Take 1 tablet by mouth daily.            Discharge Care Instructions  (From admission, onward)         Start     Ordered   05/17/20 0000  Leave dressing on - Keep it clean, dry, and intact until clinic visit       Comments: Dressing to stay on for 7 days- please include [place] instructions   05/17/20 0654           Discharge home in stable condition Infant Feeding: Bottle Infant Disposition:home with mother Discharge instruction: per After Visit Summary and Postpartum booklet. Activity: Advance as tolerated. Pelvic rest for 6 weeks.  Diet: carb modified diet Future Appointments:No future appointments. Follow up Visit:  [place]. Go in 1 week(s).   Specialty: Obstetrics and Gynecology Contact information: [address] [place] [phone]               Please schedule this patient for a In person postpartum visit in 4 weeks  with the following provider: MD. Additional Postpartum F/U:Incision check 1 week and BP check 1 week  High risk pregnancy complicated by: GDM and HTN Delivery mode:  C-Section, Low Transverse  Anticipated Birth Control:  PP IUD placed   05/17/2020 Annalee Genta, DO

## 2020-05-14 NOTE — Progress Notes (Signed)
Patient Vitals for the past 4 hrs:  BP Pulse Resp  05/14/20 0000 (!) 116/100 83 17  05/13/20 2325 -- -- 17  05/13/20 2300 (!) 116/48 92 17  05/13/20 2200 (!) 156/88 93 17  05/13/20 2110 (!) 155/95 93 --   Foley still in, sleeping.  FHR Cat 1.  BS <120. Mild ctx.  MgS04 @ 2g/hr.  Continue cytotec until foley falls out and cx >70% effaced, then start Pitocin.

## 2020-05-14 NOTE — Anesthesia Preprocedure Evaluation (Signed)
Anesthesia Evaluation  Patient identified by MRN, date of birth, ID band Patient awake    Reviewed: Allergy & Precautions, Patient's Chart, lab work & pertinent test results  Airway Mallampati: III  TM Distance: >3 FB Neck ROM: Full    Dental no notable dental hx. (+) Teeth Intact   Pulmonary Current Smoker and Patient abstained from smoking.,    Pulmonary exam normal breath sounds clear to auscultation       Cardiovascular hypertension, Normal cardiovascular exam Rhythm:Regular Rate:Normal     Neuro/Psych  Neuromuscular disease negative psych ROS   GI/Hepatic Neg liver ROS, GERD  Medicated,(+)     substance abuse  marijuana use,   Endo/Other  diabetes, Well Controlled, GestationalMorbid obesity  Renal/GU   negative genitourinary   Musculoskeletal   Abdominal (+) + obese,   Peds  Hematology  (+) anemia ,   Anesthesia Other Findings   Reproductive/Obstetrics (+) Pregnancy Compound presentation Pre eclampsia on MgSO4 37 1/7 weeks                             Anesthesia Physical Anesthesia Plan  ASA: III and emergent  Anesthesia Plan: Spinal   Post-op Pain Management:    Induction:   PONV Risk Score and Plan: 3 and Treatment may vary due to age or medical condition, Ondansetron and Scopolamine patch - Pre-op  Airway Management Planned: Natural Airway  Additional Equipment:   Intra-op Plan:   Post-operative Plan:   Informed Consent: I have reviewed the patients History and Physical, chart, labs and discussed the procedure including the risks, benefits and alternatives for the proposed anesthesia with the patient or authorized representative who has indicated his/her understanding and acceptance.     Dental advisory given  Plan Discussed with: CRNA and Anesthesiologist  Anesthesia Plan Comments:         Anesthesia Quick Evaluation

## 2020-05-14 NOTE — Transfer of Care (Signed)
Immediate Anesthesia Transfer of Care Note  Patient: Jacqueline Shepherd  Procedure(s) Performed: CESAREAN SECTION (N/A )  Patient Location: PACU  Anesthesia Type:Spinal  Level of Consciousness: awake, alert  and oriented  Airway & Oxygen Therapy: Patient Spontanous Breathing  Post-op Assessment: Report given to RN and Post -op Vital signs reviewed and stable  Post vital signs: Reviewed and stable  Last Vitals:  Vitals Value Taken Time  BP 117/55 05/14/20 1949  Temp    Pulse    Resp 25 05/14/20 1952  SpO2    Vitals shown include unvalidated device data.  Last Pain:  Vitals:   05/14/20 1413  TempSrc: Oral  PainSc:       Patients Stated Pain Goal: 3 (05/12/20 0831)  Complications: No complications documented.

## 2020-05-14 NOTE — Progress Notes (Addendum)
Faculty Note  S: In to see patient at RN request, they felt hand through cervix.   O: BP 134/70   Pulse 90   Temp 98.7 F (37.1 C) (Oral)   Resp 18   Ht 5\' 6"  (1.676 m)   Wt (!) 156.2 kg   LMP 09/17/2019   SpO2 100%   BMI 55.56 kg/m   Gen: alert, oriented SVE: 2 with fetal hand through cervix  FHT: 150 bpm, moderate variability, accels not present, no decels Toco: ctx every few min  Bedside 09/19/2019: cephalic  A/P: Pt is 30 y.o. G2P0010 @ [redacted]w[redacted]d who is admitted for induction of labor for pre-eclampsia with severe features. Patient has progressed slowly, now fetal hand through cervix. Cannot palpate head to push fetal hand away. Patient requesting c-section, which I agree with as the safest course.   The risks of cesarean section were discussed with the patient; including but not limited to: infection which may require antibiotics; bleeding which may require transfusion or re-operation; injury to bowel, bladder, ureters or other surrounding organs; need for additional procedures in the event of a life-threatening hemorrhage; placental abnormalities wth subsequent pregnancies, risk of needing c-sections in future pregnancies, incisional problems, thromboembolic phenomenon and other postoperative/anesthesia complications. Answered all questions. The patient verbalized understanding of the plan, giving informed consent for the procedure. [name] agreeable to blood transfusion in the event of emergency.  Patient has been NPO since am, she will remain NPO for procedure Anesthesia and OR aware Preoperative prophylactic antibiotics and SCDs ordered on call to the OR  To OR when ready    K. [redacted]w[redacted]d, MD, [facility] Attending Center for [facility])  Addendum: Patient desires IUD placed at the time of c-section. She was counseled regarding the risks/benefits of IUD including insertion risk of infection, hemorrhage, damage to surrounding tissue and organs. She was counseled  regarding risks of IUD including implantation, expulsion. Reviewed that she is also at slightly higher risk for ectopic pregnancy and she should take a pregnancy test if she believes she may be pregnant. She was advised to use backup method of protection for one week. She verbalized understanding of all of the above and consent signed.   [facility], MD, [facility] Attending Center for [facility])

## 2020-05-14 NOTE — Anesthesia Procedure Notes (Signed)
Spinal  Patient location during procedure: OR Start time: 05/14/2020 5:50 PM End time: 05/14/2020 6:00 PM Reason for block: surgical anesthesia Staffing Performed: anesthesiologist  Anesthesiologist: Elmer Picker, MD Preanesthetic Checklist Completed: patient identified, IV checked, risks and benefits discussed, surgical consent, monitors and equipment checked, pre-op evaluation and timeout performed Spinal Block Patient position: sitting Prep: DuraPrep and site prepped and draped Patient monitoring: cardiac monitor, continuous pulse ox and blood pressure Approach: midline Location: L3-4 Injection technique: single-shot Needle Needle type: Pencan  Needle gauge: 24 G Needle length: 9 cm Assessment Sensory level: T6 Events: CSF return Additional Notes Functioning IV was confirmed and monitors were applied. Sterile prep and drape, including hand hygiene and sterile gloves were used. The patient was positioned and the spine was prepped. The skin was anesthetized with lidocaine.  Free flow of clear CSF was obtained prior to injecting local anesthetic into the CSF.  The spinal needle aspirated freely following injection.  The needle was carefully withdrawn.  The patient tolerated the procedure well.

## 2020-05-14 NOTE — Op Note (Addendum)
Jacqueline Shepherd PROCEDURE DATE: 05/14/2020  PREOPERATIVE DIAGNOSES: Intrauterine pregnancy at [redacted]w[redacted]d weeks gestation; malpresentation: hand presentation during induction of labor and severe preeclampsia, gDMA1  POSTOPERATIVE DIAGNOSES: The same  PROCEDURE: Primary Low Transverse Cesarean Section, Liletta IUD placed  SURGEON:  K. Therese Sarah, MD  ASSISTANT:  Raelyn Mora, CNM  An experienced assistant was required given the standard of surgical care given the complexity of the case.  This assistant was needed for exposure, dissection, suctioning, retraction, instrument exchange, assisting with delivery with administration of fundal pressure, and for overall help during the procedure.  ANESTHESIOLOGY TEAM: Anesthesiologist: Elmer Picker, MD CRNA: Graciela Husbands, CRNA; Renford Dills, CRNA  INDICATIONS: Jacqueline Shepherd is a 30 y.o. G2P1011 at [redacted]w[redacted]d here for cesarean section secondary to the indications listed under preoperative diagnoses; please see preoperative note for further details.  The risks of cesarean section were discussed with the patient including but were not limited to: bleeding which may require transfusion or reoperation; infection which may require antibiotics; injury to bowel, bladder, ureters or other surrounding organs; injury to the fetus; need for additional procedures including hysterectomy in the event of a life-threatening hemorrhage; placental abnormalities wth subsequent pregnancies, incisional problems, thromboembolic phenomenon and other postoperative/anesthesia complications.  She consents to blood transfusion in the event of an emergency. The patient verbalized understanding of the plan, giving informed written consent for the procedure.    FINDINGS:  Viable female infant in cephalic presentation.  Apgars 8 and 9. Weight: pending. Clear amniotic fluid.  Intact placenta, three vessel cord.  Normal uterus, fallopian tubes and ovaries  bilaterally.  ANESTHESIA: Spinal INTRAVENOUS FLUIDS: 2000 ml   ESTIMATED BLOOD LOSS: 377 ml URINE OUTPUT:  250 ml SPECIMENS: Placenta sent to L&D COMPLICATIONS: None immediate  PROCEDURE IN DETAIL:  The patient preoperatively received intravenous antibiotics and had sequential compression devices applied to her lower extremities.  She was then taken to the operating room where spinal anesthesia was administered and was found to be adequate. She was then placed in a dorsal supine position with a leftward tilt, and a Traxi retraction device was placed to good effect on the pannus/abdomen. She was prepped and draped in a sterile manner.  A foley catheter was placed into her bladder with sterile technique and attached to constant gravity.  After a timeout was performed, a Pfannenstiel skin incision was made with scalpel and carried through to the underlying layer of fascia. The fascia was incised in the midline, and this incision was extended bilaterally using the Mayo scissors.  Kocher clamps were applied to the superior aspect of the fascial incision and the underlying rectus muscles were dissected off bluntly & sharply.  A similar process was carried out on the inferior aspect of the fascial incision. The rectus muscles were separated in the midline bluntly and the peritoneum was entered bluntly. The peritoneal incision was carefully extended laterally and caudad with good visualization of the bladder. The uterus appeared normal. The Alexis O-ring retractor was placed into the incision, taking care not to incorporate bowel or omentum. The bladder blade was inserted. Attention was turned to the lower uterine segment where a low transverse hysterotomy was made with a scalpel and extended bilaterally bluntly.  The infant was delivered from cephalic position, nose and mouth were bulb suctioned, and the cord clamped and cut after 1 minute. The infant was then handed over to the waiting neonatology team. Uterine  massage was then performed, and the placenta delivered intact with  a three-vessel cord. The uterus was then cleared of clots and debris.  The hysterotomy was closed with 0-Vicryl. Once the hysterotomy closure was completed approximately halfway across, the Liletta was removed from the packaging in a sterile fashion. The strings were trimmed and the IUD was removed from the inserted. It was placed at the fundus using a ring forceps and the strings were tucked towards the cervix. The hysterotomy closure was completed, taking care not to incorporate the IUD strings into the hysterotomy. An imbricating layer was also placed with 0 Vicryl. The fallopian tubes and ovaries were visualized bilaterally and normal appearing. The Alexis retractor was removed.  The pelvis was cleared of all clot and debris. Hemostasis was again confirmed on all surfaces. The peritoneum was re-approximated using 2-0 Vicryl suture. The fascia was then closed using 0 PDS in a running fashion.  The subcutaneous layer was irrigated, then reapproximated with 2-0 plain gut, and 30 ml of 0.5% Marcaine was injected subcutaneously around the incision.  The skin was closed with a 4-0 Monocryl subcuticular stitch. A Prevena suction dressing was placed over the incision. The patient tolerated the procedure well. Sponge, lap, instrument and needle counts were correct x 3.  She was taken to the recovery room in stable condition.    Jacqueline Shepherd, M.D. Attending Obstetrician & Gynecologist, [facility] for [employer], [facility]

## 2020-05-14 NOTE — Progress Notes (Signed)
Jacqueline Shepherd is a 30 y.o. G2P0010 at [redacted]w[redacted]d by ultrasound admitted for induction of labor due to Pre-eclampsia.  Subjective: RN requesting AROM with subsequent insertion of IUPC and FSE. Difficulty tracing contractions or fetal heart rate at this time. Patient comfortable with IV pain meds; some discomfort with contractions.  Objective: BP (!) 160/88   Pulse 87   Temp (!) 96.8 F (36 C) (Axillary)   Resp 18   Ht 5\' 6"  (1.676 m)   Wt (!) 156.2 kg   LMP 09/17/2019   SpO2 100%   BMI 55.56 kg/m  I/O last 3 completed shifts: In: 3297.5 [P.O.:724; I.V.:2223.5; IV Piggyback:350] Out: 3050 [Urine:3050] Total I/O In: 1066.9 [P.O.:370; I.V.:651.9; IV Piggyback:45] Out: 1900 [Urine:1900]  FHT:  FHR: 135 bpm, variability: moderate,  accelerations:  Present,  decelerations:  Absent UC:   regular, every 2-2.5 minutes SVE:   Dilation: 4.5 Effacement (%): 80 Station: -3 Exam by:: [email or web address], CNM AROM with copious amounts of clear fluid in return. IUPC placed without difficulty.   Labs: Lab Results  Component Value Date   WBC 12.8 (H) 05/14/2020   HGB 9.4 (L) 05/14/2020   HCT 29.8 (L) 05/14/2020   MCV 83.5 05/14/2020   PLT 201 05/14/2020    Assessment / Plan: Induction of labor due to preeclampsia,  progressing well on pitocin  Labor: Progressing on Pitocin, will continue to increase Preeclampsia:  on magnesium sulfate and no signs or symptoms of toxicity Fetal Wellbeing:  Category I Pain Control:  IV pain meds I/D:  (+) GBS Anticipated MOD:  NSVD  05/16/2020, CNM 05/14/2020, 12:40 PM

## 2020-05-14 NOTE — Progress Notes (Addendum)
Jacqueline Shepherd is a 30 y.o. G2P0010 at [redacted]w[redacted]d admitted for induction of labor due to [facility].  Subjective:  Comfortable, up ad lib. Reports walking laps around 4am.  Objective:  BP (!) 143/50   Pulse 95   Temp 98.5 F (36.9 C) (Oral)   Resp 17   Ht 5\' 6"  (1.676 m)   Wt (!) 156.2 kg   LMP 09/17/2019   SpO2 100%   BMI 55.56 kg/m  I/O last 3 completed shifts: In: 3297.5 [P.O.:724; I.V.:2223.5; IV Piggyback:350] Out: 3050 [Urine:3050] No intake/output data recorded.  FHT:  FHR: 130 bpm, variability: moderate,  accelerations:  Present,  decelerations:  Absent UC:   Indeterminate  SVE:   Dilation: 4.5 Effacement (%): 60 Station: -3 Exam by:: [email or web address], CNM  Labs:  Lab Results  Component Value Date   WBC 11.6 (H) 05/13/2020   HGB 9.5 (L) 05/13/2020   HCT 30.0 (L) 05/13/2020   MCV 83.8 05/13/2020   PLT 197 05/13/2020    Assessment / Plan:  30 y.o GeP0010 @ [redacted]w[redacted]d  Labor: Continue Mag at 2g/hr; start pitocin after patient eats breakfast Preeclampsia:  labs stable Fetal Wellbeing:  Category I Pain Control:  Labor support without medications I/D:  n/a Anticipated MOD:  NSVD  [redacted]w[redacted]d, Student-MidWife Juliann Pares 05/14/2020, 7:51 AM

## 2020-05-14 NOTE — Progress Notes (Signed)
Blood sugars<120.  Foley still in. Irregular and mild ctx.  FHR Cat 1. BP 140-150/80-90/  MgSO4 @ 2gm/hr.  No HA, vision changes, RUQ pain.  Will continue w/cytotec until foley falls out and cx >70% effaced, then start Pitocin.,

## 2020-05-15 ENCOUNTER — Encounter (HOSPITAL_COMMUNITY): Payer: Self-pay | Admitting: Obstetrics and Gynecology

## 2020-05-15 DIAGNOSIS — Z975 Presence of (intrauterine) contraceptive device: Secondary | ICD-10-CM

## 2020-05-15 LAB — CBC
HCT: 26.8 % — ABNORMAL LOW (ref 36.0–46.0)
Hemoglobin: 8.5 g/dL — ABNORMAL LOW (ref 12.0–15.0)
MCH: 26.4 pg (ref 26.0–34.0)
MCHC: 31.7 g/dL (ref 30.0–36.0)
MCV: 83.2 fL (ref 80.0–100.0)
Platelets: 178 10*3/uL (ref 150–400)
RBC: 3.22 MIL/uL — ABNORMAL LOW (ref 3.87–5.11)
RDW: 15.2 % (ref 11.5–15.5)
WBC: 15.2 10*3/uL — ABNORMAL HIGH (ref 4.0–10.5)
nRBC: 0 % (ref 0.0–0.2)

## 2020-05-15 LAB — GLUCOSE, CAPILLARY: Glucose-Capillary: 134 mg/dL — ABNORMAL HIGH (ref 70–99)

## 2020-05-15 MED ORDER — MEASLES, MUMPS & RUBELLA VAC IJ SOLR
0.5000 mL | Freq: Once | INTRAMUSCULAR | Status: AC
  Administered 2020-05-16: 0.5 mL via SUBCUTANEOUS
  Filled 2020-05-15: qty 0.5

## 2020-05-15 MED ORDER — MAGNESIUM SULFATE 40 GM/1000ML IV SOLN: 2.0000 g/h | INTRAVENOUS | Status: AC

## 2020-05-15 MED ORDER — FERROUS SULFATE 325 (65 FE) MG PO TABS
325.0000 mg | ORAL_TABLET | Freq: Every day | ORAL | Status: DC
  Administered 2020-05-16 – 2020-05-17 (×2): 325 mg via ORAL
  Filled 2020-05-15 (×2): qty 1

## 2020-05-15 MED ORDER — IBUPROFEN 600 MG PO TABS
600.0000 mg | ORAL_TABLET | Freq: Four times a day (QID) | ORAL | Status: DC
  Administered 2020-05-15 – 2020-05-17 (×7): 600 mg via ORAL
  Filled 2020-05-15 (×7): qty 1

## 2020-05-15 NOTE — Progress Notes (Signed)
OB/GYN Faculty Attending Note  Post Op Day 1  Subjective: Patient is feeling very well. She reports well controlled pain on PO pain meds. [name] ambulating and denies light-headedness or dizziness. She has not voided yet, foley catheter recently out. [name] not passing flatus, has not had a BM yet. [name] tolerating a regular diet without nausea/vomiting. Bleeding is moderate. [name] breast feeding. Baby is in room and doing well.  Objective: Blood pressure 122/85, pulse 82, temperature (!) 97.5 F (36.4 C), temperature source Axillary, resp. rate 18, height 5\' 6"  (1.676 m), weight (!) 156.2 kg, last menstrual period 09/17/2019, SpO2 100 %, unknown if currently breastfeeding. Temp:  [96.5 F (35.8 C)-98.7 F (37.1 C)] 97.5 F (36.4 C) (04/23 0750) Pulse Rate:  [65-96] 82 (04/23 0750) Resp:  [12-32] 18 (04/23 0750) BP: (99-160)/(50-98) 122/85 (04/23 0750) SpO2:  [93 %-100 %] 100 % (04/23 0750)  Physical Exam:  General: alert, oriented, cooperative Chest: normal respiratory effort Heart: RRR  Abdomen: soft, appropriately tender to palpation, incision covered by dressing with no evidence of active bleeding  Uterine Fundus: firm, 2 fingers below the umbilicus Lochia: moderate, rubra DVT Evaluation: no evidence of DVT Extremities: no edema, no calf tenderness  UOP: >50 mL/hr clear yellow urine  Recent Labs    05/14/20 0902 05/15/20 0538  HGB 9.4* 8.5*  HCT 29.8* 26.8*    Assessment/Plan: Patient Active Problem List   Diagnosis Date Noted  . IUD (intrauterine device) in place 05/15/2020  . Fetal malpresentation 05/14/2020  . Preeclampsia 05/10/2020  . Antepartum mild preeclampsia 05/07/2020  . Carpal tunnel syndrome during pregnancy 04/15/2020  . Gestational diabetes mellitus (GDM), antepartum 04/13/2020  . Dysuria during pregnancy in second trimester 02/23/2020  . Abnormal TSH 12/09/2019  . Alpha thalassemia silent carrier 11/24/2019  . Hyperemesis gravidarum, antepartum  11/22/2019  . Tobacco use in pregnancy, antepartum, first trimester 11/11/2019  . Other social stressor 11/11/2019  . 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 Obesity in pregnancy 10/12/2019  . BMI 40.0-44.9, adult (HCC) 10/12/2019  . Cannabinoid hyperemesis syndrome   . Supervision of high risk pregnancy, antepartum 10/06/2019  . Chlamydia infection affecting pregnancy in second trimester 08/18/2018  . Rubella non-immune status, antepartum 08/18/2018    Patient is 30 y.o. G2P1011 POD#1 s/p 1LTCS + IUD insertion at [redacted]w[redacted]d for malpresentation. Course complicated by severe pre-eclampsia, remains on magnesium until 7 pm tonight. [name] doing very well, recovering appropriately and complains only of mild positional discomfort. Has not been started on anti-hypertensives but reviewed she will likely need to be at some point.   Mag until 7 pm tonight Continue routine post partum care Pain meds prn Regular diet Lovenox 40 mg daily Harrison IUD in place for birth control Plan for discharge possibly tomorrow   K. [redacted]w[redacted]d, MD, [facility] Attending Center for [facility] (Faculty Practice)  05/15/2020, 8:12 AM

## 2020-05-15 NOTE — Clinical Social Work Maternal (Signed)
CLINICAL SOCIAL WORK MATERNAL/CHILD NOTE  Patient Details  Name: Jacqueline Shepherd MRN: [id number] Date of Birth: 06/01/1990  Date:  05/15/2020  Clinical Social Worker Initiating Note:  Victoire Deans, MSW, LCSW-A Date/Time: Initiated:  05/15/20/1107     Child's Name:  Jacqueline Shepherd   Biological Parents:  Mother,Father (Jacqueline Shepherd)   Need for Interpreter:  None   Reason for Referral:  Current Substance Use/Substance Use During Pregnancy    Address:  [address] [place], [place] [address]   Phone number:  [phone] (home)     Additional phone number:   Household Members/Support Persons (HM/SP):       HM/SP Name Relationship DOB or Age  HM/SP -1        HM/SP -2        HM/SP -3        HM/SP -4        HM/SP -5        HM/SP -6        HM/SP -7        HM/SP -8          Natural Supports (not living in the home):  Immediate Family   Professional Supports:     Employment: Full-time   Type of Work: CNA   Education:  College graduate   Homebound arranged:    Financial Resources:  Medicaid   Other Resources:  Food Stamps ,WIC   Cultural/Religious Considerations Which May Impact Care:    Strengths:  Ability to meet basic needs ,Compliance with medical plan ,Pediatrician chosen   Psychotropic Medications:         Pediatrician:    [place] area  Pediatrician List:   [facility]  [place]    [place]    [place]     [place]    [place]      Pediatrician Fax Number:    Risk Factors/Current Problems:  Substance Use    Cognitive State:  Insightful ,Alert ,Able to Concentrate    Mood/Affect:  Relaxed ,Blunted    CSW Assessment: CSW met with MOB to complete assessment for substance use. CSW observed MOB resting in bed, infant in bassinet and female friend sleeping on couch. CSW explained role and reason for consult. MOB was pleasant, polite and engaged with CSW. MOB reported, once she  was aware of pregnancy, she stopped using THC. MOB also, denied use of any other substances. MOB reported, hx of depression from 9- 18 years old. However, at this time able to manage symptoms with no medication. MOB reported, her father and brother are her supports. MOB denied SI, HI and DV when CSW assessed for safety. MOB reported, she is currently feeling okay.   MOB reported, she lives alone and unsure if FOB will be involved. MOB reported, receiving WIC/FS. CSW informed MOB about adding infant to WIS/FS.   MOB reported, infant's pediatrician will be at [facility] and there are no barriers to follow up care. MOB reported, she has all essentials need to care for infant. MOB reported, infant has a new car seat and bassinet.     CSW provided education regarding the baby blues period vs. perinatal mood disorders, discussed treatment and gave resources for mental health follow up if concerns arise. CSW recommends self- evaluation during the postpartum time period using the New Mom Checklist from Postpartum Progress and encouraged MOB to contact a medical professional if symptoms are noted at any time.     CSW   provided education on Sudden Infant Death Syndrome (SIDS).   CSW informed MOB of Drug Screen Policy and MOB was understanding of protocol.CSW will continue to follow the CDS and will make [place] CPS report if warranted.   CSW identifies no further need for intervention or barriers to discharge at this time.   CSW Plan/Description:  No Further Intervention Required/No Barriers to Discharge,Sudden Infant Death Syndrome (SIDS) Education,Perinatal Mood and Anxiety Disorder (PMADs) Education,Hospital Drug Screen Policy Information,CSW Will Continue to Monitor Umbilical Cord Tissue Drug Screen Results and Make Report if Warranted    Tajah Schreiner, LCSWA 05/15/2020, 11:16 AM 

## 2020-05-15 NOTE — Lactation Note (Signed)
This note was copied from a baby's chart. Lactation Consultation Note  Patient Name: Jacqueline Shepherd HUDJS'H Date: 05/15/2020 Reason for consult: Follow-up assessment;Mother's request;Difficult latch;Early term 37-38.6wks;Maternal endocrine disorder;Other (Comment) (GHTN) Age:30 years  Infant receiving bottles. Last feeding at the breast at 11 am. On arrival, Mom offering Similac 22 cal/oz 4 ml. LC assisted with infant latching. She will latch but only give a few sucks with breast compression and then stop. LC tried both cross cradle and football with same results. LC applied 20 NS infant will sustain the latch but no signs of milk transfer.   LC had Mom pump using DEBP for 5 -10 minutes. Infant not able to latch at this feeding. But Mom will call for next feeding and will pre pump before latching.   Aunt is paced bottle feeding infant rest of the formula for a total of 16 ml.  LC reviewed feeding guidelines for LPTI to reduce calorie loss including keeping total feeding under 30 minutes.   Plan 1. Mom to latch infant at breast and look for signs of milk transfer feeding based on cues 8-12x in 24 hr period no more than 3 hrs without an attempt. Mom to call RN or [facility] for assistance.             2. Mom to paced bottle feed EBM or Similac 22 cal/oz based on LPTI guidelines and hrs of age since birth.              3. Mom to pump using dEBP q  3 hrs for 15 minutes.   All questions answered at the end of the visit.   Maternal Data    Feeding Mother's Current Feeding Choice: Breast Milk and Formula Nipple Type: Slow - flow  LATCH Score Latch: Repeated attempts needed to sustain latch, nipple held in mouth throughout feeding, stimulation needed to elicit sucking reflex.  Audible Swallowing: A few with stimulation  Type of Nipple: Everted at rest and after stimulation  Comfort (Breast/Nipple): Soft / non-tender  Hold (Positioning): Assistance needed to correctly position infant at  breast and maintain latch.  LATCH Score: 7   Lactation Tools Discussed/Used Tools: Flanges;Pump;Nipple Shields Nipple shield size: 20 Flange Size: 24 Breast pump type: Double-Electric Breast Pump Reason for Pumping: increase stimulation Pumping frequency: every 3 hrs for 15 minutes  Interventions Interventions: Breast feeding basics reviewed;Breast compression;Assisted with latch;Adjust position;Skin to skin;Support pillows;Breast massage;Position options;Education;Hand express;Expressed milk;DEBP  Discharge    Consult Status Consult Status: Follow-up Date: 05/16/20 Follow-up type: In-patient    Tamiki Kuba  Nicholson-Springer 05/15/2020, 4:34 PM

## 2020-05-15 NOTE — Lactation Note (Signed)
This note was copied from a baby's chart. Lactation Consultation Note Baby 9 hrs old at the start of consult. Mom stated she had just finished feeding the baby. Mom was walking in room going on the other side of bed. LC asked mom if she would like to pump, mom stated yes. Mom shown how to use DEBP & how to disassemble, clean, & reassemble parts. Mom knows to pump q3h for 15-20 min. Asked mom if she would like to pump now, mom stated yes. Mom pumped collected 1 drop of colostrum. Milk storage discussed. LPI information sheet and lactation brochure given.  Mom encouraged to feed baby 8-12 times/24 hours and with feeding cues.  Mom asked several good questions. Mom has a DEBP at home. Mom plans to BF, pump and formula feed. RN told this LC that mom told her she was going to bottle feed until she went home then she was going to BF. Lactation brochure given.  Patient Name: Jacqueline Shepherd [id number] Date: 05/15/2020 Reason for consult: Initial assessment;Primapara;Early term 37-38.6wks Age:30 hours  Maternal Data Has patient been taught Hand Expression?: Yes Does the patient have breastfeeding experience prior to this delivery?: No  Feeding Nipple Type: Slow - flow  LATCH Score       Type of Nipple: Everted at rest and after stimulation  Comfort (Breast/Nipple): Soft / non-tender         Lactation Tools Discussed/Used Tools: Pump Breast pump type: Double-Electric Breast Pump Pump Education: Setup, frequency, and cleaning;Milk Storage Reason for Pumping: less than 6 lbs Pumping frequency: Q 3 hrs Pumped volume:  (drop)  Interventions Interventions: Breast feeding basics reviewed;Breast massage;Hand express;Pre-pump if needed;DEBP;Breast compression  Discharge WIC Program: Yes  Consult Status Consult Status: Follow-up Date: 05/16/20 Follow-up type: In-patient    Charyl Dancer 05/15/2020, 4:48 AM

## 2020-05-16 MED ORDER — NIFEDIPINE ER OSMOTIC RELEASE 30 MG PO TB24
30.0000 mg | ORAL_TABLET | Freq: Every day | ORAL | Status: DC
  Administered 2020-05-16: 30 mg via ORAL
  Filled 2020-05-16: qty 1

## 2020-05-16 NOTE — Progress Notes (Signed)
POSTPARTUM PROGRESS NOTE  POD #2  Subjective:  Jacqueline Shepherd is a 30 y.o. G2P1011 s/p primary C-section at [redacted]w[redacted]d. Today she notes she is having considerable pain- rates her pain 10 this am. Spoke with RN- pt had slept through the night and did not require medication.  Prior to this was getting scheduled pain medication. She denies any problems with ambulating, voiding or po intake. Denies nausea or vomiting. She has + flatus, noBM. Lochia minimal Denies fever/chills/chest pain/SOB.  no HA, no blurry vision, no RUQ pain  Objective: Blood pressure (!) 154/97, pulse 69, temperature 98.3 F (36.8 C), temperature source Oral, resp. rate 18, height 5\' 6"  (1.676 m), weight (!) 156.2 kg, last menstrual period 09/17/2019, SpO2 100 %, unknown if currently breastfeeding.  Physical Exam:  General: alert, cooperative and no distress Chest: no respiratory distress Heart: regular rate and rhythm Abdomen: soft, nontender, obese, +BS Uterine Fundus: firm, appropriately tender Incision: with prevena dressing DVT Evaluation: no evidence of DVT, no calf tenderness Extremities: 1+ edema Skin: warm, dry  No results found for this or any previous visit (from the past 24 hour(s)).  Assessment/Plan: Jacqueline Shepherd is a 30 y.o. G2P1011 s/p pLTCS at [redacted]w[redacted]d POD#2 complicated by: 1) Preeclampsia with severe features -s/p Mag x 24hr -BP elevated this am, plan to start ProcardiaXL 30mg  daily  2) Postop care -Pain management- discussed with RN, continued scheduled pain medication- pt due this am, will continue to monitor -encourage ambulation -SCDs while in bed, Lovenox daily  Contraception: postpartum IUD Feeding: breast  Dispo: Continue with postop care as outlined above, plan for discharge on POD#3   LOS: 6 days   [redacted]w[redacted]d, DO Faculty Attending, Center for [facility] 05/16/2020, 10:06 AM

## 2020-05-16 NOTE — Discharge Instructions (Addendum)
Cesarean Delivery, Care After This sheet gives you information about how to care for yourself after your procedure. Your health care provider may also give you more specific instructions. If you have problems or questions, contact your health care provider. What can I expect after the procedure? After the procedure, it is common to have:  A small amount of blood or clear fluid coming from the incision.  Some redness, swelling, and pain in your incision area.  Some abdominal pain and soreness.  Vaginal bleeding (lochia). Even though you did not have a vaginal delivery, you will still have vaginal bleeding and discharge.  Pelvic cramps.  Fatigue. You may have pain, swelling, and discomfort in the tissue between your vagina and your anus (perineum) if:  Your C-section was unplanned, and you were allowed to labor and push.  An incision was made in the area (episiotomy) or the tissue tore during attempted vaginal delivery. Follow these instructions at home: Incision care  Follow instructions from your health care provider about how to take care of your incision. Make sure you: ? Wash your hands with soap and water before you change your bandage (dressing). If soap and water are not available, use hand sanitizer. ? If you have a dressing, change it or remove it as told by your health care provider. ? Leave stitches (sutures), skin staples, skin glue, or adhesive strips in place. These skin closures may need to stay in place for 2 weeks or longer. If adhesive strip edges start to loosen and curl up, you may trim the loose edges. Do not remove adhesive strips completely unless your health care provider tells you to do that.  Check your incision area every day for signs of infection. Check for: ? More redness, swelling, or pain. ? More fluid or blood. ? Warmth. ? Pus or a bad smell.  Do not take baths, swim, or use a hot tub until your health care provider says it's okay. Ask your health  care provider if you can take showers.  When you cough or sneeze, hug a pillow. This helps with pain and decreases the chance of your incision opening up (dehiscing). Do this until your incision heals.   Medicines  Take over-the-counter and prescription medicines only as told by your health care provider.  If you were prescribed an antibiotic medicine, take it as told by your health care provider. Do not stop taking the antibiotic even if you start to feel better.  Do not drive or use heavy machinery while taking prescription pain medicine. Lifestyle  Do not drink alcohol. This is especially important if you are breastfeeding or taking pain medicine.  Do not use any products that contain nicotine or tobacco, such as cigarettes, e-cigarettes, and chewing tobacco. If you need help quitting, ask your health care provider. Eating and drinking  Drink at least 8 eight-ounce glasses of water every day unless told not to by your health care provider. If you breastfeed, you may need to drink even more water.  Eat high-fiber foods every day. These foods may help prevent or relieve constipation. High-fiber foods include: ? Whole grain cereals and breads. ? Brown rice. ? Beans. ? Fresh fruits and vegetables. Activity  If possible, have someone help you care for your baby and help with household activities for at least a few days after you leave the hospital.  Return to your normal activities as told by your health care provider. Ask your health care provider what activities are safe for   you.  Rest as much as possible. Try to rest or take a nap while your baby is sleeping.  Do not lift anything that is heavier than 10 lbs (4.5 kg), or the limit that you were told, until your health care provider says that it is safe.  Talk with your health care provider about when you can engage in sexual activity. This may depend on your: ? Risk of infection. ? How fast you heal. ? Comfort and desire to  engage in sexual activity.   General instructions  Do not use tampons or douches until your health care provider approves.  Wear loose, comfortable clothing and a supportive and well-fitting bra.  Keep your perineum clean and dry. Wipe from front to back when you use the toilet.  If you pass a blood clot, save it and call your health care provider to discuss. Do not flush blood clots down the toilet before you get instructions from your health care provider.  Keep all follow-up visits for you and your baby as told by your health care provider. This is important. Contact a health care provider if:  You have: ? A fever. ? Bad-smelling vaginal discharge. ? Pus or a bad smell coming from your incision. ? Difficulty or pain when urinating. ? A sudden increase or decrease in the frequency of your bowel movements. ? More redness, swelling, or pain around your incision. ? More fluid or blood coming from your incision. ? A rash. ? Nausea. ? Little or no interest in activities you used to enjoy. ? Questions about caring for yourself or your baby.  Your incision feels warm to the touch.  Your breasts turn red or become painful or hard.  You feel unusually sad or worried.  You vomit.  You pass a blood clot from your vagina.  You urinate more than usual.  You are dizzy or light-headed. Get help right away if:  You have: ? Pain that does not go away or get better with medicine. ? Chest pain. ? Difficulty breathing. ? Blurred vision or spots in your vision. ? Thoughts about hurting yourself or your baby. ? New pain in your abdomen or in one of your legs. ? A severe headache.  You faint.  You bleed from your vagina so much that you fill more than one sanitary pad in one hour. Bleeding should not be heavier than your heaviest period. Summary  After the procedure, it is common to have pain at your incision site, abdominal cramping, and slight bleeding from your vagina.  Check  your incision area every day for signs of infection.  Tell your health care provider about any unusual symptoms.  Keep all follow-up visits for you and your baby as told by your health care provider. This information is not intended to replace advice given to you by your health care provider. Make sure you discuss any questions you have with your health care provider. Document Revised: 07/18/2017 Document Reviewed: 07/18/2017 Elsevier Patient Education  2021 [employer]vier Inc.  

## 2020-05-16 NOTE — Lactation Note (Signed)
This note was copied from a baby's chart. Lactation Consultation Note  Patient Name: Jacqueline Shepherd JOACZ'Y Date: 05/16/2020 Reason for consult: Follow-up assessment;Primapara;1st time breastfeeding;Early term 37-38.6wks;Infant < 6lbs;Maternal endocrine disorder Age:30 hours  LC in to visit with P1 Mom of ET infant.  Baby's weight is at 2% weight loss today with 1 stool and 2 voids last 24 hrs.   Mom bottle feeding baby when LC came into room.  23 ml was gone from bottle, but baby was still cueing.  Mom stated that baby didn't take 23 ml from this feeding, she had used the same bottle for a few feedings.  Educated Mom to use a different bottle at each feeding.  Mom interested in breastfeeding.  Offered to help.  Mom still in pain from C/S, sitting in recliner.  Assisted with STS on chest and baby showing some subtle cues.  Assisted Mom to support her breast and baby's head in cross cradle hold.. Reviewed breast massage and hand expression, drop of colostrum expressed. LC assisted to sandwich breast and baby able to attain somewhat adequate latch.  Baby sucked using jaw extensions a couple times before falling asleep.  LC swaddled baby and placed her on her back in her crib, but she continued to cue.    Assisted Mom to double pump on initiation setting.  24 mm flanges are a good fit currently.    LC attempted to feed baby with the purple nipple provided.  Baby noted to thrust her tongue and tuck her upper lip in.  Flanged lips and aimed nipple to roof of mouth and baby was sucking, but unable to create suction at this time.  Ped and RN aware of LC concern.  Mom states she has WIC.  Did not send referral today as Mom and baby won't be DC'd today or tomorrow.     Feeding Nipple Type: Nfant Slow Flow (purple)  LATCH Score Latch: Repeated attempts needed to sustain latch, nipple held in mouth throughout feeding, stimulation needed to elicit sucking reflex.  Audible Swallowing:  None  Type of Nipple: Everted at rest and after stimulation  Comfort (Breast/Nipple): Soft / non-tender  Hold (Positioning): Full assist, staff holds infant at breast  LATCH Score: 5   Lactation Tools Discussed/Used Tools: Pump;Flanges Flange Size: 24 Breast pump type: Double-Electric Breast Pump Pumping frequency: Q 3hrs Pumped volume: 0 mL  Interventions Interventions: Breast feeding basics reviewed;Assisted with latch;Skin to skin;Breast massage;Hand express;Adjust position;Support pillows;Position options;DEBP  Consult Status Consult Status: Follow-up Date: 05/17/20 Follow-up type: In-patient    Jacqueline Shepherd 05/16/2020, 11:55 AM

## 2020-05-16 NOTE — Anesthesia Postprocedure Evaluation (Signed)
Anesthesia Post Note  Patient: Jacqueline Shepherd  Procedure(s) Performed: CESAREAN SECTION (N/A )     Patient location during evaluation: PACU Anesthesia Type: Spinal Level of consciousness: oriented and awake and alert Pain management: pain level controlled Vital Signs Assessment: post-procedure vital signs reviewed and stable Respiratory status: spontaneous breathing, respiratory function stable and patient connected to nasal cannula oxygen Cardiovascular status: blood pressure returned to baseline and stable Postop Assessment: no headache, no backache and no apparent nausea or vomiting Anesthetic complications: no   No complications documented.  Last Vitals:  Vitals:   05/15/20 2358 05/16/20 0400  BP: 132/69 138/69  Pulse: 82 71  Resp: 17   Temp: 37.1 C 37.1 C  SpO2: 100% 100%    Last Pain:  Vitals:   05/16/20 0622  TempSrc:   PainSc: 9    Pain Goal: Patients Stated Pain Goal: 3 (05/16/20 0622)                 Romie Jumper L Lavanna Rog

## 2020-05-17 ENCOUNTER — Other Ambulatory Visit (HOSPITAL_COMMUNITY): Payer: Self-pay

## 2020-05-17 MED ORDER — FERROUS SULFATE 325 (65 FE) MG PO TABS
325.0000 mg | ORAL_TABLET | ORAL | 3 refills | Status: DC
  Filled 2020-05-17: qty 30, 60d supply, fill #0

## 2020-05-17 MED ORDER — OXYCODONE HCL 5 MG PO TABS
5.0000 mg | ORAL_TABLET | Freq: Four times a day (QID) | ORAL | 0 refills | Status: AC | PRN
  Filled 2020-05-17: qty 25, 7d supply, fill #0

## 2020-05-17 MED ORDER — IBUPROFEN 600 MG PO TABS
600.0000 mg | ORAL_TABLET | Freq: Four times a day (QID) | ORAL | 0 refills | Status: DC
  Filled 2020-05-17: qty 30, 8d supply, fill #0

## 2020-05-17 MED ORDER — NIFEDIPINE ER OSMOTIC RELEASE 30 MG PO TB24
60.0000 mg | ORAL_TABLET | Freq: Every day | ORAL | Status: DC
  Administered 2020-05-17: 60 mg via ORAL
  Filled 2020-05-17: qty 2

## 2020-05-17 MED ORDER — NIFEDIPINE ER 60 MG PO TB24
60.0000 mg | ORAL_TABLET | Freq: Every day | ORAL | 3 refills | Status: DC
  Filled 2020-05-17: qty 30, 30d supply, fill #0

## 2020-05-17 MED ORDER — ACETAMINOPHEN 500 MG PO TABS
500.0000 mg | ORAL_TABLET | Freq: Four times a day (QID) | ORAL | 0 refills | Status: DC | PRN
  Filled 2020-05-17: qty 30, 8d supply, fill #0

## 2020-05-17 NOTE — Plan of Care (Signed)
  Problem: Coping: Goal: Level of anxiety will decrease Outcome: Adequate for Discharge   Problem: Pain Managment: Goal: General experience of comfort will improve Outcome: Adequate for Discharge   Problem: Safety: Goal: Ability to remain free from injury will improve Outcome: Adequate for Discharge   Problem: Skin Integrity: Goal: Risk for impaired skin integrity will decrease Outcome: Adequate for Discharge   Problem: Education: Goal: Knowledge of disease or condition will improve Outcome: Adequate for Discharge Goal: Knowledge of the prescribed therapeutic regimen will improve Outcome: Adequate for Discharge Goal: Individualized Educational Video(s) Outcome: Adequate for Discharge   Problem: Clinical Measurements: Goal: Complications related to the disease process, condition or treatment will be avoided or minimized Outcome: Adequate for Discharge   Problem: Education: Goal: Ability to describe self-care measures that may prevent or decrease complications (Diabetes Survival Skills Education) will improve Outcome: Adequate for Discharge Goal: Individualized Educational Video(s) Outcome: Adequate for Discharge   Problem: Coping: Goal: Ability to adjust to condition or change in health will improve Outcome: Adequate for Discharge   Problem: Fluid Volume: Goal: Ability to maintain a balanced intake and output will improve Outcome: Adequate for Discharge   Problem: Health Behavior/Discharge Planning: Goal: Ability to identify and utilize available resources and services will improve Outcome: Adequate for Discharge Goal: Ability to manage health-related needs will improve Outcome: Adequate for Discharge   Problem: Metabolic: Goal: Ability to maintain appropriate glucose levels will improve Outcome: Adequate for Discharge   Problem: Nutritional: Goal: Maintenance of adequate nutrition will improve Outcome: Adequate for Discharge Goal: Progress toward achieving an  optimal weight will improve Outcome: Adequate for Discharge   Problem: Skin Integrity: Goal: Risk for impaired skin integrity will decrease Outcome: Adequate for Discharge   Problem: Tissue Perfusion: Goal: Adequacy of tissue perfusion will improve Outcome: Adequate for Discharge   Problem: Education: Goal: Knowledge of condition will improve Outcome: Adequate for Discharge Goal: Individualized Educational Video(s) Outcome: Adequate for Discharge Goal: Individualized Newborn Educational Video(s) Outcome: Adequate for Discharge   Problem: Activity: Goal: Will verbalize the importance of balancing activity with adequate rest periods Outcome: Adequate for Discharge Goal: Ability to tolerate increased activity will improve Outcome: Adequate for Discharge   Problem: Coping: Goal: Ability to identify and utilize available resources and services will improve Outcome: Adequate for Discharge   Problem: Life Cycle: Goal: Chance of risk for complications during the postpartum period will decrease Outcome: Adequate for Discharge   Problem: Role Relationship: Goal: Ability to demonstrate positive interaction with newborn will improve Outcome: Adequate for Discharge   Problem: Skin Integrity: Goal: Demonstration of wound healing without infection will improve Outcome: Adequate for Discharge

## 2020-05-17 NOTE — Lactation Note (Signed)
This note was copied from a baby's chart. Lactation Consultation Note  Patient Name: Jacqueline Shepherd OZDGU'Y Date: 05/17/2020 Reason for consult: Follow-up assessment;Early term 37-38.6wks;Infant < 6lbs;1st time breastfeeding;NICU baby Age:30 hours   [name] whose infant is now 39 hours old.  This is an ETI at 37+1 weeks weighing < 6 lbs.  Baby has a 3% weight loss this morning.  Family will be discharged home today.  Visited with mother to review feeding plan and assess mother's knowledge on how to care for baby after discharge.  Mother will continue to feed at least every three hours or sooner if baby shows feeding cues.  Discussed bottle feeding prior to breast feeding per the advice of SLP visit from yesterday.  Explained the benefits to providing adequate calories frequently and to spend 30 minutes or less for the total feeding time between breast and bottle.  Mother stated that her baby would not nipple feed for her until the "speech lady" showed her how to feed the baby.  Mother reported that baby does better with nipple feedings now.  Discussed volumes of 20-30 mls per feeding now and baby should increase to 30+ mls by tonight.  Baby has been receiving Similac 20 and Similac 22 calorie formula.  RN to provide Similac 22 calorie formula for the next feeding.  Mother has been pumping but not consistently.  She could not verbalize how many mls the pumping sessions have yielded.  She explained that it is only enough to come into the flanges at this time.  Encouraged pumping every three hours.  She has her brother (who has moved in with her) to assist with care.  Explained how her brother could assist with the feedings.    Mother is a [facility] participant.  No referral faxed yet.  Obtained mother's signature and faxed a referral with urgency to allow mother to pick it up today if possible.  Made mother aware of the importance of consistently pumping to help establish a good milk supply.  RN,  Crystal, will page me if [facility] unable to provide a pump today and I will discuss the loaner program with mother.    OP referral message sent for [facility] follow up consult.     Maternal Data Has patient been taught Hand Expression?: Yes Does the patient have breastfeeding experience prior to this delivery?: No  Feeding Mother's Current Feeding Choice: Breast Milk and Formula Nipple Type: Nfant Slow Flow (purple)  LATCH Score                    Lactation Tools Discussed/Used Breast pump type: Double-Electric Breast Pump;Manual Pumping frequency: Every three hours  Interventions Interventions: Education  Discharge Discharge Education: Engorgement and breast care;Warning signs for feeding baby;Outpatient recommendation;Outpatient Epic message sent Pump: DEBP;Manual;Personal (Plans to obtain [facility] DEBP; mother to call this a.m. prior to discharge) [facility] Program: Yes  Consult Status Consult Status: Complete Date: 05/17/20 Follow-up type: Call as needed    Jacqueline Shepherd Jacqueline Shepherd 05/17/2020, 9:29 AM

## 2020-05-17 NOTE — Progress Notes (Signed)
Discharge instructions given, all questions answered, pt verbalized understanding. Pt is alert and oriented x4. Pt is ambulatory. Pt waiting in room for infant's d/c.

## 2020-05-18 LAB — TYPE AND SCREEN
ABO/RH(D): B POS
Antibody Screen: POSITIVE
Donor AG Type: NEGATIVE
Donor AG Type: NEGATIVE
Unit division: 0
Unit division: 0

## 2020-05-18 LAB — BPAM RBC
Blood Product Expiration Date: 202205082359
Blood Product Expiration Date: 202205082359
Unit Type and Rh: 7300
Unit Type and Rh: 7300

## 2020-05-21 ENCOUNTER — Inpatient Hospital Stay (HOSPITAL_COMMUNITY)
Admission: AD | Admit: 2020-05-21 | Discharge: 2020-05-21 | Disposition: A | Discharge status: Home / Self Care | Payer: Medicaid Other | Attending: Obstetrics and Gynecology | Admitting: Obstetrics and Gynecology

## 2020-05-21 ENCOUNTER — Encounter (HOSPITAL_COMMUNITY): Payer: Self-pay | Admitting: Obstetrics and Gynecology

## 2020-05-21 ENCOUNTER — Other Ambulatory Visit: Payer: Self-pay

## 2020-05-21 DIAGNOSIS — Z4801 Encounter for change or removal of surgical wound dressing: Secondary | ICD-10-CM

## 2020-05-21 DIAGNOSIS — F17210 Nicotine dependence, cigarettes, uncomplicated: Secondary | ICD-10-CM | POA: Diagnosis not present

## 2020-05-21 DIAGNOSIS — O9089 Other complications of the puerperium, not elsewhere classified: Secondary | ICD-10-CM | POA: Diagnosis present

## 2020-05-21 DIAGNOSIS — Z4689 Encounter for fitting and adjustment of other specified devices: Secondary | ICD-10-CM

## 2020-05-21 DIAGNOSIS — Z79899 Other long term (current) drug therapy: Secondary | ICD-10-CM | POA: Diagnosis not present

## 2020-05-21 NOTE — MAU Note (Signed)
Pt presents to MAU via EMS because her wound vac stopped working around 10 pm this evening.  Pt denies any other concerns at this time.  Pt states wound vac was supposed to shut off on 05/24/2020.

## 2020-05-21 NOTE — MAU Provider Note (Signed)
Chief Complaint:  wound vac stopped working   Event Date/Time   First Provider Initiated Contact with Patient 05/21/20 0219      HPI: Jacqueline Shepherd is a 30 y.o. G2P1011 who is 7 days Postop from a Cesarean Delivery,  presents via EMA to maternity admissions reporting her wound vac had stopped working.  She had understood it was supposed to work until 05/24/20, so she is worried because it stopped (company lit states it is programmed to stop on Day 7). .Has no other complaints. She denies h/a, dizziness, n/v, or fever/chills.    HPI RN Note: Pt presents to MAU via EMS because her wound vac stopped working around 10 pm this evening.  Pt denies any other concerns at this time.  Pt states wound vac was supposed to shut off on 05/24/2020.  Past Medical History: Past Medical History:  Diagnosis Date  . Abdominal pain   . Cannabinoid hyperemesis syndrome   . Chlamydia infection complicating pregnancy in second trimester 08/18/2018   Negative test of cure on 08/06/18  . Transient hypertension of pregnancy 10/12/2019  . Weight loss 10/12/2019    Past obstetric history: OB History  Gravida Para Term Preterm AB Living  _0 SAB IAB Ectopic Multiple Live Births  1     0 1    # Outcome Date GA Lbr Len/2nd Weight Sex Delivery Anes PTL Lv  2 Term 05/14/20 [redacted]w[redacted]d 2650 g F CS-LTranv Spinal  LIV  1 SAB 08/15/18 187w6d         Past Surgical History: Past Surgical History:  Procedure Laterality Date  . CESAREAN SECTION N/A 05/14/2020   Procedure: CESAREAN SECTION;  Surgeon: DaSloan LeiterMD;  [facility];  Service: Obstetrics;  Laterality: N/A;  Primary C/S malpresentation & IUD placement  . WISDOM TOOTH EXTRACTION      Family History: Family History  Problem Relation Age of Onset  . Diabetes Mother   . Hypertension Mother     Social History: Social History   Tobacco Use  . Smoking status: Current Some Day Smoker    Packs/day: 0.25    Last attempt to quit:  01/22/2020    Years since quitting: 0.3  . Smokeless tobacco: Never Used  Vaping Use  . Vaping Use: Never used  Substance Use Topics  . Alcohol use: No  . Drug use: Yes    Types: Marijuana    Comment: 05/06/20    Allergies: No Known Allergies  Meds:  Medications Prior to Admission  Medication Sig Dispense Refill Last Dose  . acetaminophen (TYLENOL) 500 MG tablet Take 1 tablet (500 mg total) by mouth every 6 (six) hours as needed. 30 tablet 0 05/20/2020 at Unknown time  . ferrous sulfate 325 (65 FE) MG tablet Take 1 tablet (325 mg total) by mouth every other day. 30 tablet 3 05/20/2020 at Unknown time  . ibuprofen (ADVIL) 600 MG tablet Take 1 tablet (600 mg total) by mouth every 6 (six) hours. 30 tablet 0 05/20/2020 at Unknown time  . NIFEdipine (ADALAT CC) 60 MG 24 hr tablet Take 1 tablet (60 mg total) by mouth daily. 30 tablet 3 05/20/2020 at Unknown time  . oxyCODONE (OXY IR/ROXICODONE) 5 MG immediate release tablet Take 1 tablet (5 mg total) by mouth every 6 (six) hours as needed for up to 7 days for severe pain or breakthrough pain. 25 tablet 0 05/20/2020 at Unknown time  .  Accu-Chek Softclix Lancets lancets Please check blood sugar up to four times per day 100 each 1   . Blood Pressure Monitor KIT 1 Device by Does not apply route once a week. To be monitored Regularly at home. 1 kit 0   . glucose blood test strip Use as instructed 100 each 12   . Prenatal Vit-Fe Fumarate-FA (PREPLUS) 27-1 MG TABS Take 1 tablet by mouth daily. 30 tablet 13     I have reviewed patient's Past Medical Hx, Surgical Hx, Family Hx, Social Hx, medications and allergies.  ROS:  Review of Systems  Constitutional: Negative for chills and fever.  Eyes: Negative for visual disturbance.  Respiratory: Negative for shortness of breath.   Gastrointestinal: Positive for abdominal pain (appropriate for postop day#7). Negative for constipation, diarrhea, nausea and vomiting.  Neurological: Negative for dizziness and  weakness.   Other systems negative     Physical Exam   Patient Vitals for the past 24 hrs:  BP Temp Temp src Pulse SpO2  05/21/20 0147 123/73 -- -- 75 --  05/21/20 0145 -- -- -- -- 100 %  05/21/20 0140 -- -- -- -- 100 %  05/21/20 0130 138/84 98.6 F (37 C) Oral 84 99 %   Constitutional: Well-developed, well-nourished female in no acute distress.  Cardiovascular: normal rate  Respiratory: normal effort, no distress.  GI: Abd soft, appropriately tender.  Nondistended.  No rebound, No guarding.         Prevena not functioning, dressing clean and intact.   MS: Extremities nontender, no edema, normal ROM Neurologic: Alert and oriented x 4.   Grossly nonfocal. GU: Neg CVAT. Skin:  Warm and Dry Psych:  Affect appropriate.   Labs: No results found for this or any previous visit (from the past 24 hour(s)). --/--/B POS (04/22 2021)  Imaging:    MAU Course/MDM: Dressing removed with great difficulty due to patient intolerance  She would not let me remove remainder of adhesive residue Incision is clean and intact, no erethema or drainage    Pt stable at time of discharge.  Assessment: PostOp Day #7 Prevena not working Dressing removed  Plan: Discharge home Recommend Warm soapy showers to remove adhesive.  Pt states she cannot bear to touch it at all.  Encouraged to relax, used analgesics as needed   Follow-up [place] Follow up.   Specialty: Obstetrics and Gynecology Why: 05/24/20 at 1:40pm Contact information: [address] [place] [phone]              Encouraged to return here or to other Urgent Care/ED if she develops worsening of symptoms, increase in pain, fever, or other concerning symptoms.   Hansel Feinstein CNM, MSN Certified Nurse-Midwife 05/21/2020 2:19 AM

## 2020-05-21 NOTE — Discharge Instructions (Signed)
Incision Care, Adult An incision is a cut that a doctor makes in your skin for surgery. Most times, these cuts are closed after surgery. Your cut from surgery may be closed with:  Stitches (sutures).  Staples.  Skin glue.  Skin tape (adhesive) strips. You may need to return to your doctor to have stitches or staples taken out. This may happen many days or many weeks after your surgery. You need to take good care of your cut so it does not get infected. Follow instructions from your doctor about how to care for your cut. Supplies needed:  Soap and water.  A clean hand towel.  Wound cleanser.  Clean gauze. How to care for your cut from surgery Cleaning your cut Ask your doctor how to clean your cut. You may need to:  Use mild soap and water, or a wound cleanser.  Use a clean gauze to pat your cut dry after you clean it.  YOU MAY LEAVE YOUR INCISION OPEN TO AIR< NO DRESSING. Checking for infection Check your cut area every day for signs of infection. Check for:  More redness, swelling, or pain.  More fluid or blood.  Warmth.  Pus or a bad smell.   Follow these instructions at home Medicines  Take over-the-counter and prescription medicines only as told by your doctor.  If you were prescribed an antibiotic medicine, cream, or ointment, use it as told by your doctor. Do not stop using the antibiotic even if your condition improves. Eating and drinking  Eat foods that have a lot of certain nutrients, such as protein, vitamin A, and vitamin C. These foods help your cut heal. ? Foods rich in protein include meat, fish, eggs, dairy, beans, and nuts. ? Foods rich in vitamin A include carrots and dark green, leafy vegetables. ? Foods rich in vitamin C include citrus fruits, tomatoes, broccoli, and peppers.  Drink enough fluid to keep your pee (urine) pale yellow. General instructions  YOU MAY SHOWER AND WASH AREA WITH SOAP AND WATER  Limit movement around your cut. This  helps with healing. ? Try not to strain, lift, or exercise for the first 2 weeks, or for as long as told by your doctor. ? Return to your normal activities as told by your doctor. Ask your doctor what activities are safe for you.  Do not scratch or pick at your cut. Keep it covered as told by your doctor.  Protect your cut from the sun when you are outside for the first 6 months, or for as long as told by your doctor. Cover up the scar area or put on sunscreen that has an SPF of at least 30.  Do not use any products that contain nicotine or tobacco, such as cigarettes, e-cigarettes, and chewing tobacco. These can delay cut healing after surgery. If you need help quitting, ask your doctor.  Keep all follow-up visits as told by your doctor. This is important.   Contact a doctor if:  You have any of these signs of infection around your cut: ? More redness, swelling, or pain. ? More fluid or blood. ? Warmth. ? Pus or a bad smell.  You have a fever.  You feel like you may vomit (nauseous).  You vomit.  You are dizzy.  Your stitches, staples, skin glue, or tape strips come undone. Get help right away if:  Your cut has a red streak coming from it.  Your cut bleeds through your bandage, and bleeding does not stop  with gentle pressure.  Your cut opens up and comes apart.  Your body reacts very badly to an infection. This may include: ? A fever, chills, or feeling cold. ? Feeling mixed up, worried, or nervous. ? Very bad pain. ? Trouble breathing. ? A fast heartbeat. ? Clammy or sweaty skin. ? A rash. These symptoms may be an emergency. Do not wait to see if the symptoms will go away. Get medical help right away. Call your local emergency services (911 in the [place] Do not drive yourself to the hospital. Summary  Follow instructions from your doctor about how to care for your cut from surgery.  Wash your hands with soap and water for at least 20 seconds before and after you  change your bandage. If you cannot use soap and water, use hand sanitizer.  Check your cut area every day for signs of infection.  Keep all follow-up visits as told by your doctor. This is important. This information is not intended to replace advice given to you by your health care provider. Make sure you discuss any questions you have with your health care provider. Document Revised: 10/30/2018 Document Reviewed: 10/30/2018 Elsevier Patient Education  2021 [employer].

## 2020-05-24 ENCOUNTER — Other Ambulatory Visit: Payer: Self-pay

## 2020-05-24 ENCOUNTER — Ambulatory Visit (INDEPENDENT_AMBULATORY_CARE_PROVIDER_SITE_OTHER): Payer: Medicaid Other

## 2020-05-24 ENCOUNTER — Ambulatory Visit: Payer: Medicaid Other

## 2020-05-24 VITALS — BP 132/85 | HR 72

## 2020-05-24 DIAGNOSIS — Z5189 Encounter for other specified aftercare: Secondary | ICD-10-CM

## 2020-05-24 NOTE — Progress Notes (Signed)
Subjective:     Jacqueline Shepherd is a 30 y.o. female who presents to the clinic two weeks status post c-section. Eating a regular diet with out difficulty. Bowel movements are normal. No abnormal pain at this time. Review of Systems   Objective:    BP 132/85   Pulse 72   LMP 09/17/2019  General:   well appearance   Abdomen: {post op abd exam:,soft","bowel sounds active,non-tender  Incision:   {incision:"no dehiscence,incision well approximated","healing well,no drainage,no erythema,no hernia,no seroma,no swelling     Assessment:   Doing well and blood pressure is well-controlled at this time.   Plan:    1. Continue any current medications. 2. Wound care discussed. 3. Activity restrictions: as tolerated 4. Anticipated return to work: six weeks. 5. Follow up:  Schedule for 06/28/2020  Ivo Moga Emeline Darling, CMA

## 2020-05-26 ENCOUNTER — Ambulatory Visit (INDEPENDENT_AMBULATORY_CARE_PROVIDER_SITE_OTHER): Payer: Medicaid Other | Admitting: Obstetrics and Gynecology

## 2020-05-26 ENCOUNTER — Encounter: Payer: Self-pay | Admitting: Obstetrics and Gynecology

## 2020-05-26 ENCOUNTER — Other Ambulatory Visit: Payer: Self-pay

## 2020-05-26 ENCOUNTER — Telehealth: Payer: Self-pay

## 2020-05-26 VITALS — BP 141/97 | HR 79 | Wt 314.0 lb

## 2020-05-26 DIAGNOSIS — Z5189 Encounter for other specified aftercare: Secondary | ICD-10-CM

## 2020-05-26 MED ORDER — HYDROCHLOROTHIAZIDE 25 MG PO TABS: 25.0000 mg | ORAL_TABLET | Freq: Every day | ORAL | 3 refills | Status: DC

## 2020-05-26 NOTE — Progress Notes (Signed)
PP patient presents for incision check again pt was seen on 05/24/20.  Pt complains today of  knots on her stomach. Pt states pain is 10/10x pt states it feels like stones are in her stomach.

## 2020-05-26 NOTE — Progress Notes (Signed)
30 yo P1 s/p c-section on 05/14/20 presenting for re-evaluation of incision. Patient reports the presence of knots under her skin near the incision. She denies fever, drainage from her incision or erythema. She reports pain controlled with ibuprofen. Patient is both breast and formula feeding.   Past Medical History:  Diagnosis Date  . Abdominal pain   . Cannabinoid hyperemesis syndrome   . Chlamydia infection complicating pregnancy in second trimester 08/18/2018   Negative test of cure on 08/06/18  . Transient hypertension of pregnancy 10/12/2019  . Weight loss 10/12/2019   Past Surgical History:  Procedure Laterality Date  . CESAREAN SECTION N/A 05/14/2020   Procedure: CESAREAN SECTION;  Surgeon: Conan Bowens, MD;  Location: [facility] LD ORS;  Service: Obstetrics;  Laterality: N/A;  Primary C/S malpresentation & IUD placement  . WISDOM TOOTH EXTRACTION     Family History  Problem Relation Age of Onset  . Diabetes Mother   . Hypertension Mother    Social History   Tobacco Use  . Smoking status: Current Some Day Smoker    Packs/day: 0.25    Last attempt to quit: 01/22/2020    Years since quitting: 0.3  . Smokeless tobacco: Never Used  Vaping Use  . Vaping Use: Never used  Substance Use Topics  . Alcohol use: No  . Drug use: Yes    Types: Marijuana    Comment: 05/06/20   ROS See pertinent in HPI. All other systems reviewed and non contributory  Blood pressure (!) 141/97, pulse 79, weight (!) 314 lb (142.4 kg), unknown if currently breastfeeding. GENERAL: Well-developed, well-nourished female in no acute distress.  BREASTS: Symmetric in size. No palpable masses or lymphadenopathy, skin changes, or nipple drainage. ABDOMEN: Soft, nontender, nondistended.  Incision: no erythema, induration or drainage EXTREMITIES: No cyanosis, clubbing, or edema, 2+ distal pulses.  A/P 30 yo here for incision check - Reassurance provided - Patient with elevated BP without symptoms Rx HCTZ  provided - RTC in a week and pp visit

## 2020-05-26 NOTE — Telephone Encounter (Signed)
Pt called stating that she needed to be seen ASAP. When I asked why she needed to be seen patient stated she had a "knot" on her stomach. I asked patient where the knot was and patient said it on her stomach. I tried asking more questions to see if the knot was in relation to patient's incision or could be an umbilical hernia but when I tried to ask patient more questions she became combative and told me just to make her an appointment so she could be seen. Pt was advised to come in at 2:15 for evaluation.

## 2020-06-03 ENCOUNTER — Inpatient Hospital Stay (HOSPITAL_COMMUNITY): Payer: Medicaid Other

## 2020-06-03 ENCOUNTER — Inpatient Hospital Stay (HOSPITAL_COMMUNITY)
Admission: AD | Admit: 2020-06-03 | Discharge: 2020-06-04 | Disposition: A | Discharge status: Home / Self Care | Payer: Medicaid Other | Attending: Obstetrics & Gynecology | Admitting: Obstetrics & Gynecology

## 2020-06-03 ENCOUNTER — Encounter (HOSPITAL_COMMUNITY): Payer: Self-pay | Admitting: Obstetrics & Gynecology

## 2020-06-03 DIAGNOSIS — F17210 Nicotine dependence, cigarettes, uncomplicated: Secondary | ICD-10-CM | POA: Diagnosis not present

## 2020-06-03 DIAGNOSIS — R112 Nausea with vomiting, unspecified: Secondary | ICD-10-CM | POA: Insufficient documentation

## 2020-06-03 DIAGNOSIS — G8918 Other acute postprocedural pain: Secondary | ICD-10-CM | POA: Insufficient documentation

## 2020-06-03 DIAGNOSIS — F1290 Cannabis use, unspecified, uncomplicated: Secondary | ICD-10-CM | POA: Insufficient documentation

## 2020-06-03 DIAGNOSIS — K5900 Constipation, unspecified: Secondary | ICD-10-CM | POA: Diagnosis not present

## 2020-06-03 DIAGNOSIS — R1011 Right upper quadrant pain: Secondary | ICD-10-CM | POA: Diagnosis not present

## 2020-06-03 DIAGNOSIS — R1010 Upper abdominal pain, unspecified: Secondary | ICD-10-CM | POA: Diagnosis not present

## 2020-06-03 DIAGNOSIS — F12188 Cannabis abuse with other cannabis-induced disorder: Secondary | ICD-10-CM

## 2020-06-03 DIAGNOSIS — R1013 Epigastric pain: Secondary | ICD-10-CM | POA: Insufficient documentation

## 2020-06-03 LAB — URINALYSIS, ROUTINE W REFLEX MICROSCOPIC
Bacteria, UA: NONE SEEN
Bilirubin Urine: NEGATIVE
Glucose, UA: NEGATIVE mg/dL
Ketones, ur: 5 mg/dL — AB
Nitrite: NEGATIVE
Protein, ur: 30 mg/dL — AB
Specific Gravity, Urine: 1.024 (ref 1.005–1.030)
WBC, UA: >50 WBC/hpf — ABNORMAL HIGH (ref 0–5)
pH: 6 (ref 5.0–8.0)

## 2020-06-03 LAB — COMPREHENSIVE METABOLIC PANEL
ALT: 13 U/L (ref 0–44)
AST: 12 U/L — ABNORMAL LOW (ref 15–41)
Albumin: 3.2 g/dL — ABNORMAL LOW (ref 3.5–5.0)
Alkaline Phosphatase: 65 U/L (ref 38–126)
Anion gap: 7 (ref 5–15)
BUN: 17 mg/dL (ref 6–20)
CO2: 23 mmol/L (ref 22–32)
Calcium: 8.6 mg/dL — ABNORMAL LOW (ref 8.9–10.3)
Chloride: 107 mmol/L (ref 98–111)
Creatinine, Ser: 0.83 mg/dL (ref 0.44–1.00)
GFR, Estimated: >60 mL/min (ref >60)
Glucose, Bld: 97 mg/dL (ref 70–99)
Potassium: 3.6 mmol/L (ref 3.5–5.1)
Sodium: 137 mmol/L (ref 135–145)
Total Bilirubin: 0.5 mg/dL (ref 0.3–1.2)
Total Protein: 6.7 g/dL (ref 6.5–8.1)

## 2020-06-03 LAB — CBC WITH DIFFERENTIAL/PLATELET
Abs Immature Granulocytes: 0.04 10*3/uL (ref 0.00–0.07)
Basophils Absolute: 0 10*3/uL (ref 0.0–0.1)
Basophils Relative: 0 %
Eosinophils Absolute: 0 10*3/uL (ref 0.0–0.5)
Eosinophils Relative: 1 %
HCT: 36.4 % (ref 36.0–46.0)
Hemoglobin: 11.2 g/dL — ABNORMAL LOW (ref 12.0–15.0)
Immature Granulocytes: 1 %
Lymphocytes Relative: 17 %
Lymphs Abs: 1.5 10*3/uL (ref 0.7–4.0)
MCH: 25.9 pg — ABNORMAL LOW (ref 26.0–34.0)
MCHC: 30.8 g/dL (ref 30.0–36.0)
MCV: 84.3 fL (ref 80.0–100.0)
Monocytes Absolute: 0.3 10*3/uL (ref 0.1–1.0)
Monocytes Relative: 4 %
Neutro Abs: 6.8 10*3/uL (ref 1.7–7.7)
Neutrophils Relative %: 77 %
Platelets: 250 10*3/uL (ref 150–400)
RBC: 4.32 MIL/uL (ref 3.87–5.11)
RDW: 14.8 % (ref 11.5–15.5)
WBC: 8.7 10*3/uL (ref 4.0–10.5)
nRBC: 0 % (ref 0.0–0.2)

## 2020-06-03 LAB — LIPASE, BLOOD: Lipase: 26 U/L (ref 11–51)

## 2020-06-03 MED ORDER — HYDROMORPHONE HCL 1 MG/ML IJ SOLN
1.0000 mg | Freq: Once | INTRAMUSCULAR | Status: AC
  Administered 2020-06-03: 1 mg via INTRAVENOUS
  Filled 2020-06-03: qty 1

## 2020-06-03 MED ORDER — LACTATED RINGERS IV SOLN: INTRAVENOUS | Status: DC

## 2020-06-03 MED ORDER — SODIUM CHLORIDE 0.9 % IV SOLN
25.0000 mg | Freq: Once | INTRAVENOUS | Status: AC
  Administered 2020-06-03: 25 mg via INTRAVENOUS
  Filled 2020-06-03: qty 1

## 2020-06-03 NOTE — MAU Provider Note (Addendum)
History     CSN: [id number]  Arrival date and time: 06/03/20 1849   Event Date/Time   First Provider Initiated Contact with Patient 06/03/20 1915      Chief Complaint  Patient presents with  . Abdominal Pain  . Nausea  . Emesis   HPI Jacqueline Shepherd is a 30 y.o. G2P1011 at 3 weeks postpartum who presents to MAU via EMS for abdominal pain & n/v. Had primary c/secion on 4/22 due to preeclampsia & malpresentation. Continued with elevated BPs at her f/u appointment, was prescribed nifedipine 60 mg & HCTZ 25 mg - states she took these meds this morning. Denies headache or visual disturbance.   Current symptoms started at midnight. Reports severe epigastric pain that is constant. Rates pain 10/10. Has not taken anything for the pain. Pain is worse with vomiting. Has vomited 10 + times since then. Does not have antiemetic at home. Ate sloppy joes, chicken, and french fries last night before symptoms started. Denies fever/chills, or diarrhea. Reports some brown bleeding that took all day to fill up a pad. Last BM was this morning; hard & small.   OB History    Gravida  2   Para  1   Term  1   Preterm      AB  1   Living  1     SAB  1   IAB      Ectopic      Multiple  0   Live Births  1           Past Medical History:  Diagnosis Date  . Abdominal pain   . Cannabinoid hyperemesis syndrome   . Chlamydia infection complicating pregnancy in second trimester 08/18/2018   Negative test of cure on 08/06/18  . Transient hypertension of pregnancy 10/12/2019  . Weight loss 10/12/2019    Past Surgical History:  Procedure Laterality Date  . CESAREAN SECTION N/A 05/14/2020   Procedure: CESAREAN SECTION;  Surgeon: Conan Bowensavis, Kelly M, MD;  [facility];  Service: Obstetrics;  Laterality: N/A;  Primary C/S malpresentation & IUD placement  . WISDOM TOOTH EXTRACTION      Family History  Problem Relation Age of Onset  . Diabetes Mother   . Hypertension Mother     Social  History   Tobacco Use  . Smoking status: Current Some Day Smoker    Packs/day: 0.25    Last attempt to quit: 01/22/2020    Years since quitting: 0.3  . Smokeless tobacco: Never Used  Vaping Use  . Vaping Use: Never used  Substance Use Topics  . Alcohol use: No  . Drug use: Yes    Types: Marijuana    Comment: 05/06/20    Allergies: No Known Allergies  No medications prior to admission.    Review of Systems Physical Exam   Blood pressure 139/64, pulse 60, temperature 98.7 F (37.1 C), temperature source Oral, resp. rate 18, unknown if currently breastfeeding.  Physical Exam Vitals and nursing note reviewed.  Constitutional:      General: [name] in acute distress.     Appearance: [name] obese. [name] not ill-appearing, toxic-appearing or diaphoretic.  HENT:     Head: Normocephalic and atraumatic.  Eyes:     General: No scleral icterus. Cardiovascular:     Heart sounds: Normal heart sounds.  Pulmonary:     Effort: Pulmonary effort is normal. No respiratory distress.     Breath sounds: Normal breath sounds.  Abdominal:     General: Bowel sounds are normal.     Palpations: Abdomen is soft.     Tenderness: There is abdominal tenderness in the right upper quadrant and epigastric area.  Skin:    General: Skin is warm and dry.  Neurological:     Mental Status: [name] alert.     MAU Course  Procedures Results for orders placed or performed during the hospital encounter of 06/03/20 (from the past 24 hour(s))  CBC with Differential/Platelet     Status: Abnormal   Collection Time: 06/03/20  7:24 PM  Result Value Ref Range   WBC 8.7 4.0 - 10.5 K/uL   RBC 4.32 3.87 - 5.11 MIL/uL   Hemoglobin 11.2 (L) 12.0 - 15.0 g/dL   HCT 50.9 32.6 - 71.2 %   MCV 84.3 80.0 - 100.0 fL   MCH 25.9 (L) 26.0 - 34.0 pg   MCHC 30.8 30.0 - 36.0 g/dL   RDW 45.8 09.9 - 83.3 %   Platelets 250 150 - 400 K/uL   nRBC 0.0 0.0 - 0.2 %   Neutrophils Relative % 77 %   Neutro Abs 6.8 1.7 - 7.7 K/uL    Lymphocytes Relative 17 %   Lymphs Abs 1.5 0.7 - 4.0 K/uL   Monocytes Relative 4 %   Monocytes Absolute 0.3 0.1 - 1.0 K/uL   Eosinophils Relative 1 %   Eosinophils Absolute 0.0 0.0 - 0.5 K/uL   Basophils Relative 0 %   Basophils Absolute 0.0 0.0 - 0.1 K/uL   Immature Granulocytes 1 %   Abs Immature Granulocytes 0.04 0.00 - 0.07 K/uL  Comprehensive metabolic panel     Status: Abnormal   Collection Time: 06/03/20  7:24 PM  Result Value Ref Range   Sodium 137 135 - 145 mmol/L   Potassium 3.6 3.5 - 5.1 mmol/L   Chloride 107 98 - 111 mmol/L   CO2 23 22 - 32 mmol/L   Glucose, Bld 97 70 - 99 mg/dL   BUN 17 6 - 20 mg/dL   Creatinine, Ser 8.25 0.44 - 1.00 mg/dL   Calcium 8.6 (L) 8.9 - 10.3 mg/dL   Total Protein 6.7 6.5 - 8.1 g/dL   Albumin 3.2 (L) 3.5 - 5.0 g/dL   AST 12 (L) 15 - 41 U/L   ALT 13 0 - 44 U/L   Alkaline Phosphatase 65 38 - 126 U/L   Total Bilirubin 0.5 0.3 - 1.2 mg/dL   GFR, Estimated >05 >39 mL/min   Anion gap 7 5 - 15  Lipase, blood     Status: None   Collection Time: 06/03/20  7:24 PM  Result Value Ref Range   Lipase 26 11 - 51 U/L  Urinalysis, Routine w reflex microscopic     Status: Abnormal   Collection Time: 06/03/20 11:29 PM  Result Value Ref Range   Color, Urine YELLOW YELLOW   APPearance HAZY (A) CLEAR   Specific Gravity, Urine 1.024 1.005 - 1.030   pH 6.0 5.0 - 8.0   Glucose, UA NEGATIVE NEGATIVE mg/dL   Hgb urine dipstick MODERATE (A) NEGATIVE   Bilirubin Urine NEGATIVE NEGATIVE   Ketones, ur 5 (A) NEGATIVE mg/dL   Protein, ur 30 (A) NEGATIVE mg/dL   Nitrite NEGATIVE NEGATIVE   Leukocytes,Ua LARGE (A) NEGATIVE   RBC / HPF 21-50 0 - 5 RBC/hpf   WBC, UA >50 (H) 0 - 5 WBC/hpf   Bacteria, UA NONE SEEN NONE SEEN   Squamous Epithelial /  LPF 0-5 0 - 5   Mucus PRESENT    Non Squamous Epithelial 0-5 (A) NONE SEEN    MDM Patient at 3 weeks postpartum with epigastric pain & n/v after eating a high fat meal.  Intake BP is severe range, but BP normal  after applying appropriately sized cuff.   IV fluids, phenergan, & dilaudid given for symptoms. CBC w/diff, CMP, & lipase ordered.  Care turned over to [facility] CNM Judeth Horn, NP 06/03/2020 8:36 PM   Results for orders placed or performed during the hospital encounter of 06/03/20 (from the past 24 hour(s))  CBC with Differential/Platelet     Status: Abnormal   Collection Time: 06/03/20  7:24 PM  Result Value Ref Range   WBC 8.7 4.0 - 10.5 K/uL   RBC 4.32 3.87 - 5.11 MIL/uL   Hemoglobin 11.2 (L) 12.0 - 15.0 g/dL   HCT 62.8 31.5 - 17.6 %   MCV 84.3 80.0 - 100.0 fL   MCH 25.9 (L) 26.0 - 34.0 pg   MCHC 30.8 30.0 - 36.0 g/dL   RDW 16.0 73.7 - 10.6 %   Platelets 250 150 - 400 K/uL   nRBC 0.0 0.0 - 0.2 %   Neutrophils Relative % 77 %   Neutro Abs 6.8 1.7 - 7.7 K/uL   Lymphocytes Relative 17 %   Lymphs Abs 1.5 0.7 - 4.0 K/uL   Monocytes Relative 4 %   Monocytes Absolute 0.3 0.1 - 1.0 K/uL   Eosinophils Relative 1 %   Eosinophils Absolute 0.0 0.0 - 0.5 K/uL   Basophils Relative 0 %   Basophils Absolute 0.0 0.0 - 0.1 K/uL   Immature Granulocytes 1 %   Abs Immature Granulocytes 0.04 0.00 - 0.07 K/uL  Comprehensive metabolic panel     Status: Abnormal   Collection Time: 06/03/20  7:24 PM  Result Value Ref Range   Sodium 137 135 - 145 mmol/L   Potassium 3.6 3.5 - 5.1 mmol/L   Chloride 107 98 - 111 mmol/L   CO2 23 22 - 32 mmol/L   Glucose, Bld 97 70 - 99 mg/dL   BUN 17 6 - 20 mg/dL   Creatinine, Ser 2.69 0.44 - 1.00 mg/dL   Calcium 8.6 (L) 8.9 - 10.3 mg/dL   Total Protein 6.7 6.5 - 8.1 g/dL   Albumin 3.2 (L) 3.5 - 5.0 g/dL   AST 12 (L) 15 - 41 U/L   ALT 13 0 - 44 U/L   Alkaline Phosphatase 65 38 - 126 U/L   Total Bilirubin 0.5 0.3 - 1.2 mg/dL   GFR, Estimated >48 >54 mL/min   Anion gap 7 5 - 15  Lipase, blood     Status: None   Collection Time: 06/03/20  7:24 PM  Result Value Ref Range   Lipase 26 11 - 51 U/L  Urinalysis, Routine w reflex microscopic      Status: Abnormal   Collection Time: 06/03/20 11:29 PM  Result Value Ref Range   Color, Urine YELLOW YELLOW   APPearance HAZY (A) CLEAR   Specific Gravity, Urine 1.024 1.005 - 1.030   pH 6.0 5.0 - 8.0   Glucose, UA NEGATIVE NEGATIVE mg/dL   Hgb urine dipstick MODERATE (A) NEGATIVE   Bilirubin Urine NEGATIVE NEGATIVE   Ketones, ur 5 (A) NEGATIVE mg/dL   Protein, ur 30 (A) NEGATIVE mg/dL   Nitrite NEGATIVE NEGATIVE   Leukocytes,Ua LARGE (A) NEGATIVE   RBC / HPF 21-50 0 - 5 RBC/hpf  WBC, UA >50 (H) 0 - 5 WBC/hpf   Bacteria, UA NONE SEEN NONE SEEN   Squamous Epithelial / LPF 0-5 0 - 5   Mucus PRESENT    Non Squamous Epithelial 0-5 (A) NONE SEEN    DG Abdomen 1 View  Result Date: 06/03/2020 CLINICAL DATA:  Abdominal pain, history of recent Caesarean section EXAM: ABDOMEN - 1 VIEW COMPARISON:  None. FINDINGS: Cardiac shadow is enlarged. Lung bases are clear. Paucity of bowel gas is noted. No obstructive changes are seen. No definitive free air is noted. No bony abnormality is seen. IMPRESSION: No acute abnormality noted. Electronically Signed   By: Alcide Clever M.D.   On: 06/03/2020 23:50   US Abdomen Limited RUQ (LIVER/GB)  Result Date: 06/04/2020 CLINICAL DATA:  30 year old female with right upper quadrant abdominal pain EXAM: ULTRASOUND ABDOMEN LIMITED RIGHT UPPER QUADRANT COMPARISON:  None. FINDINGS: Gallbladder: No gallstones or wall thickening visualized. No sonographic Murphy sign noted by sonographer. Common bile duct: Diameter: 2 mm. Liver: No focal lesion identified. Within normal limits in parenchymal echogenicity. Portal vein is patent on color Doppler imaging with normal direction of blood flow towards the liver. Other: None. IMPRESSION: Unremarkable right upper quadrant ultrasound. Electronically Signed   By: Elgie Collard M.D.   On: 06/04/2020 01:15    MDM:    No acute abdomen, no emergent findings with RUQ [place] or KUB.  Pt reports hx of abdominal pain episodes outside of  pregnancy and on chart review pt treated in ED with haldol for cannabis hyperemesis syndrome in 2021.   Vaginal bleeding scant, no lower abdomen or pelvic pain in exam.  Will treat for likely constipation with Colace and Miralax and add Levsin for GI cramping.  Rx for Oxycodone 5 mg Q 8 hours PRN x 10 tabs renewed from pt postoperative medications.  Message sent to follow up with pt at [facility] as soon as possible, earlier than her postpartum appt scheduled 06/28/20.   A/P:  1. Pain of upper abdomen   2. RUQ abdominal pain   3. Non-intractable vomiting with nausea, unspecified vomiting type   4. Constipation, unspecified constipation type   5. Postoperative pain     Assessment and Plan   D/C home Rx for Levsin and Percocet UDS pending Keep outpatient appts Return for worsening symptoms or emergencies  Sharen Counter, CNM 4:31 AM

## 2020-06-03 NOTE — MAU Note (Signed)
PT is s/p c-section on 4/22. States she began having severe  abdominal pain last night which led to vomiting all day. Tried Ibuprofen but could not keep it down. Also reports some vaginal bleeding today; she says her bleeding had stopped. BP in EMS elevated. Pain 10/10 BP upon arrival in MAU 175/94. Denies HA, blurry vision, RUQ pain or swelling.

## 2020-06-04 ENCOUNTER — Inpatient Hospital Stay (HOSPITAL_COMMUNITY): Payer: Medicaid Other

## 2020-06-04 DIAGNOSIS — F12188 Cannabis abuse with other cannabis-induced disorder: Secondary | ICD-10-CM

## 2020-06-04 MED ORDER — POLYETHYLENE GLYCOL 3350 17 GM/SCOOP PO POWD: 17.0000 g | Freq: Every day | ORAL | 0 refills | Status: DC

## 2020-06-04 MED ORDER — DOCUSATE SODIUM 100 MG PO CAPS: 100.0000 mg | ORAL_CAPSULE | Freq: Two times a day (BID) | ORAL | 2 refills | Status: DC | PRN

## 2020-06-04 MED ORDER — HYOSCYAMINE SULFATE 0.125 MG PO TABS: 0.1250 mg | ORAL_TABLET | ORAL | 0 refills | Status: DC | PRN

## 2020-06-04 MED ORDER — HYOSCYAMINE SULFATE 0.5 MG/ML IJ SOLN
0.5000 mg | Freq: Once | INTRAMUSCULAR | Status: AC
  Administered 2020-06-04: 0.5 mg via INTRAVENOUS
  Filled 2020-06-04: qty 1

## 2020-06-04 MED ORDER — OXYCODONE HCL 5 MG PO TABS: 5.0000 mg | ORAL_TABLET | Freq: Three times a day (TID) | ORAL | 0 refills | Status: DC | PRN

## 2020-06-07 ENCOUNTER — Ambulatory Visit: Payer: Medicaid Other | Admitting: Obstetrics & Gynecology

## 2020-06-28 ENCOUNTER — Encounter: Payer: Self-pay | Admitting: Obstetrics and Gynecology

## 2020-06-28 ENCOUNTER — Ambulatory Visit (INDEPENDENT_AMBULATORY_CARE_PROVIDER_SITE_OTHER): Payer: Medicaid Other | Admitting: Obstetrics and Gynecology

## 2020-06-28 ENCOUNTER — Other Ambulatory Visit: Payer: Self-pay

## 2020-06-28 VITALS — BP 137/71 | HR 78 | Wt 317.0 lb

## 2020-06-28 DIAGNOSIS — Z30431 Encounter for routine checking of intrauterine contraceptive device: Secondary | ICD-10-CM | POA: Diagnosis not present

## 2020-06-28 DIAGNOSIS — Z9889 Other specified postprocedural states: Secondary | ICD-10-CM

## 2020-06-28 DIAGNOSIS — O1493 Unspecified pre-eclampsia, third trimester: Secondary | ICD-10-CM

## 2020-06-28 DIAGNOSIS — O24410 Gestational diabetes mellitus in pregnancy, diet controlled: Secondary | ICD-10-CM

## 2020-06-28 MED ORDER — ACETAMINOPHEN 500 MG PO TABS: 500.0000 mg | ORAL_TABLET | Freq: Four times a day (QID) | ORAL | 0 refills | Status: DC | PRN

## 2020-06-28 NOTE — Progress Notes (Signed)
Obstetrics/Postpartum Visit  Appointment Date: 06/28/2020  OBGYN Clinic: [facility]  Primary Care Provider: Patient, No Pcp Per (Inactive)  Chief Complaint:  Chief Complaint  Patient presents with  . Postpartum Care    History of Present Illness: Alicyn FLYNN LININGER is a 30 y.o. African-American G2P1011 (No LMP recorded.), seen for the above chief complaint. Her past medical history is significant for obesity, Lewis isoimmunization   [name] s/p primary LTCS on 05/14/20 at 37 weeks; she was discharged to home on POD#3. Pregnancy complicated by  Patient Active Problem List   Diagnosis Date Noted  . Cannabis hyperemesis syndrome concurrent with and due to cannabis abuse (HCC) 06/04/2020  . IUD (intrauterine device) in place 05/15/2020  . Fetal malpresentation 05/14/2020  . Preeclampsia 05/10/2020  . Antepartum mild preeclampsia 05/07/2020  . Carpal tunnel syndrome during pregnancy 04/15/2020  . Gestational diabetes mellitus (GDM), antepartum 04/13/2020  . Dysuria during pregnancy in second trimester 02/23/2020  . Abnormal TSH 12/09/2019  . Hyperemesis gravidarum, antepartum 11/22/2019  . Tobacco use in pregnancy, antepartum, first trimester 11/11/2019  . Other social stressor 11/11/2019  . 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 Obesity in pregnancy 10/12/2019  . BMI 40.0-44.9, adult (HCC) 10/12/2019  . Cannabinoid hyperemesis syndrome   . Chlamydia infection affecting pregnancy in second trimester 08/18/2018  . Rubella non-immune status, antepartum 08/18/2018    Complains of diffuse stomach pain  Vaginal bleeding or discharge: No  Breast or formula feeding: bottle Intercourse: Yes  Contraception: IUD placed during c-section PP depression s/s: No  Any bowel or bladder issues: No  Pap smear: no abnormalities (date: 07/2018)  Review of Systems: Positive for n/a.   Her 12 point review of systems is negative or as noted in the History of Present Illness.  Patient  Active Problem List   Diagnosis Date Noted  . Cannabis hyperemesis syndrome concurrent with and due to cannabis abuse (HCC) 06/04/2020  . IUD (intrauterine device) in place 05/15/2020  . Fetal malpresentation 05/14/2020  . Preeclampsia 05/10/2020  . Antepartum mild preeclampsia 05/07/2020  . Carpal tunnel syndrome during pregnancy 04/15/2020  . Gestational diabetes mellitus (GDM), antepartum 04/13/2020  . Dysuria during pregnancy in second trimester 02/23/2020  . Abnormal TSH 12/09/2019  . Hyperemesis gravidarum, antepartum 11/22/2019  . Tobacco use in pregnancy, antepartum, first trimester 11/11/2019  . Other social stressor 11/11/2019  . 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 Obesity in pregnancy 10/12/2019  . BMI 40.0-44.9, adult (HCC) 10/12/2019  . Cannabinoid hyperemesis syndrome   . Chlamydia infection affecting pregnancy in second trimester 08/18/2018  . Rubella non-immune status, antepartum 08/18/2018    Medications Jaleena Josefa Half had no medications administered during this visit. Current Outpatient Medications  Medication Sig Dispense Refill  . acetaminophen (TYLENOL) 500 MG tablet Take 1 tablet (500 mg total) by mouth every 6 (six) hours as needed. 30 tablet 0  . Accu-Chek Softclix Lancets lancets Please check blood sugar up to four times per day 100 each 1  . Blood Pressure Monitor KIT 1 Device by Does not apply route once a week. To be monitored Regularly at home. 1 kit 0  . docusate sodium (COLACE) 100 MG capsule Take 1 capsule (100 mg total) by mouth 2 (two) times daily as needed. 30 capsule 2  . ferrous sulfate 325 (65 FE) MG tablet Take 1 tablet (325 mg total) by mouth every other day. 30 tablet 3  . glucose blood test strip Use as instructed 100 each 12  .  hydrochlorothiazide (HYDRODIURIL) 25 MG tablet Take 1 tablet (25 mg total) by mouth daily. 30 tablet 3  . hyoscyamine (LEVSIN) 0.125 MG tablet Take 1 tablet (0.125 mg total) by mouth every 4  (four) hours as needed. 30 tablet 0  . ibuprofen (ADVIL) 600 MG tablet Take 1 tablet (600 mg total) by mouth every 6 (six) hours. 30 tablet 0  . NIFEdipine (ADALAT CC) 60 MG 24 hr tablet Take 1 tablet (60 mg total) by mouth daily. 30 tablet 3  . oxyCODONE (ROXICODONE) 5 MG immediate release tablet Take 1 tablet (5 mg total) by mouth every 8 (eight) hours as needed. 10 tablet 0  . polyethylene glycol powder (GLYCOLAX/MIRALAX) 17 GM/SCOOP powder Take 17 g by mouth daily. 255 g 0  . Prenatal Vit-Fe Fumarate-FA (PREPLUS) 27-1 MG TABS Take 1 tablet by mouth daily. 30 tablet 13   No current facility-administered medications for this visit.    Allergies Patient has no known allergies.  Physical Exam:  BP 137/71   Pulse 78   Wt (!) 317 lb (143.8 kg)   BMI 51.17 kg/m  Body mass index is 51.17 kg/m. General appearance: Well nourished, well developed female in no acute distress.  Cardiovascular: regular rate and rhythm Respiratory:  Normal respiratory effort Abdomen: no masses, hernias; diffusely non tender to palpation, non distended, well healed pfannenstiel incision Breasts: not examined. Neuro/Psych:  Normal mood and affect.  Skin:  Warm and dry.   Pelvic exam: is not limited by body habitus EGBUS: within normal limits Vagina: within normal limits and with None blood in the vault, Cervix:  Purple IUD strings from os  PP Depression Screening:    Edinburgh Postnatal Depression Scale - 06/28/20 1016      Edinburgh Postnatal Depression Scale:  In the Past 7 Days   I have been able to laugh and see the funny side of things. 0    I have looked forward with enjoyment to things. 0    I have blamed myself unnecessarily when things went wrong. 0    I have been anxious or worried for no good reason. 0    I have felt scared or panicky for no good reason. 0    Things have been getting on top of me. 0    I have been so unhappy that I have had difficulty sleeping. 1    I have felt sad or  miserable. 1    I have been so unhappy that I have been crying. 0    The thought of harming myself has occurred to me. 0    Edinburgh Postnatal Depression Scale Total 2           Assessment: Patient is a 30 y.o. G2P1011 who is 6 weeks post partum from a c-section for fetal malpresentation. [name] doing well.   Plan:  1. Postoperative state Some pain in stomach, no concern for infection, encouraged patience as she had major abdominal surgery - tylenol sent to pharmacy  2. Postpartum state Doing well  3. IUD check up IUD in place  4. Diet controlled gestational diabetes mellitus (GDM), antepartum - return for 2 hr GTT  5. Pre-eclampsia in third trimester - was prescribed meds after delivery but stopped taking them when pain medicine ran out - BP normal today - [facility]  Essential components of care per ACOG recommendations:  1.  Mood and well being: Patient with negative depression screening today. Reviewed local resources for support.  -  Patient does not use tobacco. If using tobacco we discussed reduction and for recently cessation risk of relapse - hx of drug use? No    2. Infant care and feeding:  -Patient currently breastmilk feeding? No  -Social determinants of health (SDOH) reviewed in EPIC. The following needs were identified; food security, referred to SW  3. Sexuality, contraception and birth spacing - Patient does not want a pregnancy in the next year.  Desired family size is 1 children.  - Reviewed forms of contraception in tiered fashion. Patient desired IUD today.   - Discussed birth spacing of 18 months  4. Sleep and fatigue -Encouraged family/partner/community support of 4 hrs of uninterrupted sleep to help with mood and fatigue  5. Physical Recovery  - Discussed patients delivery and complications - Patient had a CS. Patient expressed understanding - Patient has urinary incontinence? No  - Patient is safe to resume  physical and sexual activity  6.  Health Maintenance - Last pap smear done 07/2018 and was normal with negative HPV.  7. Chronic Disease - PCP follow up   RTC for GTT   K. Arvilla Meres, MD, [place])

## 2020-06-28 NOTE — Progress Notes (Signed)
Pt is post c/s on 05/14/20. Pt states she is having some "stomach" pain and cramping, no bleeding at this time. Denies constipation or urinary concerns. Pt has IUD in place.  Pt has had intercourse once since delivery.  EPDS score today is 0.

## 2020-07-07 ENCOUNTER — Other Ambulatory Visit: Payer: Medicaid Other

## 2020-07-23 ENCOUNTER — Other Ambulatory Visit: Payer: Medicaid Other

## 2020-08-05 ENCOUNTER — Other Ambulatory Visit: Payer: Self-pay

## 2020-08-05 ENCOUNTER — Encounter: Payer: Self-pay | Admitting: Internal Medicine

## 2020-08-05 ENCOUNTER — Ambulatory Visit: Payer: Medicaid Other | Admitting: Internal Medicine

## 2020-08-05 ENCOUNTER — Encounter: Payer: Self-pay | Admitting: Clinical

## 2020-08-05 VITALS — BP 130/90 | HR 76 | Resp 28 | Ht 66.75 in | Wt 307.0 lb

## 2020-08-05 DIAGNOSIS — I10 Essential (primary) hypertension: Secondary | ICD-10-CM

## 2020-08-05 MED ORDER — TRIAMTERENE-HCTZ 37.5-25 MG PO CAPS: ORAL_CAPSULE | ORAL | 11 refills | Status: DC

## 2020-08-05 NOTE — Progress Notes (Unsigned)
    Subjective:    Patient ID: Jacqueline Shepherd, female   DOB: [birth year], 30 y.o.   MRN: [id number]   HPI   High Blood pressure:  Was prescribed BP medication, but never took, doesn't know where it is or what it was.  Elevated BP only with pregnancy.   Her mother has high blood pressure.   [name] having frontal headaches that last hours twice weekly.   Her family is obese in general.    No outpatient medications have been marked as taking for the 08/05/20 encounter (Office Visit) with Julieanne Manson, MD.   No Known Allergies   Review of Systems    Objective:   BP 130/90 (BP Location: Left Arm, Patient Position: Sitting, Cuff Size: Large)   Pulse 76   Resp (!) 28   Ht 5' 6.75" (1.695 m)   Wt (!) 307 lb (139.3 kg)   Breastfeeding No   BMI 48.44 kg/m   Physical Exam   Assessment & Plan   ***

## 2020-08-05 NOTE — Progress Notes (Addendum)
Social worker met with new patient who is scheduled with Dr. Amil Amen for medical visit. Social worker completed New Patient Questionnaire which included completion of housing, intimate partner violence, transportation needs, stress, Emergency planning/management officer strain, food insecurity and screeners. Social History   Socioeconomic History   Marital status: Single    Spouse name: Not on file   Number of children: Not on file   Years of education: Not on file   Highest education level: Not on file  Occupational History   Not on file  Tobacco Use   Smoking status: Some Days    Packs/day: 0.25    Types: Cigarettes    Last attempt to quit: 01/22/2020    Years since quitting: 0.5   Smokeless tobacco: Never  Vaping Use   Vaping Use: Never used  Substance and Sexual Activity   Alcohol use: No   Drug use: Yes    Types: Marijuana    Comment: 05/06/20   Sexual activity: Not Currently    Partners: Male    Birth control/protection: None    Comment: Pregnant   Other Topics Concern   Not on file  Social History Narrative   Not on file   Social Determinants of Health   Financial Resource Strain: High Risk   Difficulty of Paying Living Expenses: Very hard  Food Insecurity: Food Insecurity Present   Worried About Charity fundraiser in the Last Year: Often true   Arboriculturist in the Last Year: Often true  Transportation Needs: No Transportation Needs   Lack of Transportation (Medical): No   Lack of Transportation (Non-Medical): No  Physical Activity: Not on file  Stress: Stress Concern Present   Feeling of Stress : Very much  Social Connections: Moderately Isolated   Frequency of Communication with Friends and Family: More than three times a week   Frequency of Social Gatherings with Friends and Family: More than three times a week   Attends Religious Services: 1 to 4 times per year   Active Member of [employer] or Organizations: No   Attends Archivist Meetings: Never   Marital  Status: Never married    Depression screen [facility] 2/9 08/05/2020 04/29/2020  Decreased Interest 0 3  Down, Depressed, Hopeless 2 3  PHQ - 2 Score 2 6  Altered sleeping 0 0  Tired, decreased energy 3 3  Change in appetite 1 0  Feeling bad or failure about yourself  3 2  Trouble concentrating 0 0  Moving slowly or fidgety/restless 0 0  Suicidal thoughts 0 0  PHQ-9 Score 9 11  Difficult doing work/chores Extremely dIfficult -    GAD 7 : Generalized Anxiety Score 08/05/2020 04/29/2020  Nervous, Anxious, on Edge 3 3  Control/stop worrying 3 3  Worry too much - different things 3 3  Trouble relaxing 0 3  Restless 0 1  Easily annoyed or irritable 3 3  Afraid - awful might happen 3 3  Total GAD 7 Score 15 19    It should be noted patient is currently a participant with [facility] program with [facility]. Patient currently receives food and other support by [facility]. Patient reports she has a history of depression and experiencing post partum symptoms. Patient reports a history of treatment with therapy and medications. LCSW informed this clinician is leaving however encouraged to participated in the group therapy following it to begin again and/or contact local counseling agencies. Patient expressed understanding and received a list of local places.  Based  on presentation Recommended counseling. List of counselors provided. It should be noted patient would also like a dental referral due to recently breaking a tooth

## 2020-08-19 ENCOUNTER — Other Ambulatory Visit: Payer: Medicaid Other

## 2020-08-25 ENCOUNTER — Other Ambulatory Visit: Payer: Medicaid Other

## 2020-09-10 ENCOUNTER — Other Ambulatory Visit: Payer: Medicaid Other

## 2020-09-14 ENCOUNTER — Ambulatory Visit: Payer: Medicaid Other | Admitting: Internal Medicine

## 2020-12-17 ENCOUNTER — Encounter (HOSPITAL_COMMUNITY): Payer: Self-pay | Admitting: Emergency Medicine

## 2020-12-17 ENCOUNTER — Ambulatory Visit (HOSPITAL_COMMUNITY)
Admission: EM | Admit: 2020-12-17 | Discharge: 2020-12-17 | Disposition: A | Discharge status: Home / Self Care | Payer: Medicaid Other | Attending: Emergency Medicine | Admitting: Emergency Medicine

## 2020-12-17 ENCOUNTER — Other Ambulatory Visit: Payer: Self-pay

## 2020-12-17 DIAGNOSIS — H66001 Acute suppurative otitis media without spontaneous rupture of ear drum, right ear: Secondary | ICD-10-CM

## 2020-12-17 MED ORDER — CEFDINIR 300 MG PO CAPS: 300.0000 mg | ORAL_CAPSULE | Freq: Two times a day (BID) | ORAL | 0 refills | Status: AC

## 2020-12-17 NOTE — ED Triage Notes (Signed)
Pt c/o right ear clogged and painful for a couple days.

## 2020-12-17 NOTE — Discharge Instructions (Addendum)
Please begin cefdinir twice daily to treat your acute ear infection.  If you have not had resolution of your symptoms in the next 10 days, please follow-up with your primary care physician for reevaluation.  If your symptoms worsen over the next 10 days while you are taking antibiotics, please seek emergency medical attention.

## 2020-12-17 NOTE — ED Provider Notes (Signed)
[facility]    CSN: [id number] Arrival date & time: 12/17/20  1554    HISTORY   Chief Complaint  Patient presents with   Ear Fullness   HPI Jacqueline Shepherd is a 30 y.o. female. Patient complains of her right ear being clogged and painful for the past 2 weeks and for the past few days has begun to hurt.  Patient states she can no longer lie on her right side secondary to pain.  Patient denies draining from her right ear.  Patient states she cannot hear out of her right ear as well as her left.  Patient states she is never having this before.  Patient states she is tried a lot of home remedies with no improvement.  Patient denies recent upper respiratory illness.  The history is provided by the patient.  Past Medical History:  Diagnosis Date   Abdominal pain    Cannabinoid hyperemesis syndrome    Chlamydia infection complicating pregnancy in second trimester 08/18/2018   Negative test of cure on 08/06/18   Transient hypertension of pregnancy 10/12/2019   Weight loss 10/12/2019   Patient Active Problem List   Diagnosis Date Noted   Cannabis hyperemesis syndrome concurrent with and due to cannabis abuse (HCC) 06/04/2020   IUD (intrauterine device) in place 05/15/2020   Fetal malpresentation 05/14/2020   Preeclampsia 05/10/2020   Antepartum mild preeclampsia 05/07/2020   Carpal tunnel syndrome during pregnancy 04/15/2020   Gestational diabetes mellitus (GDM), antepartum 04/13/2020   Dysuria during pregnancy in second trimester 02/23/2020   Abnormal TSH 12/09/2019   Hyperemesis gravidarum, antepartum 11/22/2019   Tobacco use in pregnancy, antepartum, first trimester 11/11/2019   Other social stressor 11/11/2019   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   Obesity in pregnancy 10/12/2019   BMI 40.0-44.9, adult (HCC) 10/12/2019   Cannabinoid hyperemesis syndrome    Chlamydia infection affecting pregnancy in second trimester 08/18/2018   Rubella non-immune  status, antepartum 08/18/2018   Past Surgical History:  Procedure Laterality Date   CESAREAN SECTION N/A 05/14/2020   Procedure: CESAREAN SECTION;  Surgeon: Conan Bowens, MD;  Location: [facility];  Service: Obstetrics;  Laterality: N/A;  Primary C/S malpresentation & IUD placement   WISDOM TOOTH EXTRACTION     OB History     Gravida  2   Para  1   Term  1   Preterm      AB  1   Living  1      SAB  1   IAB      Ectopic      Multiple  0   Live Births  1          Home Medications    Prior to Admission medications   Medication Sig Start Date End Date Taking? Authorizing Provider  cefdinir (OMNICEF) 300 MG capsule Take 1 capsule (300 mg total) by mouth 2 (two) times daily for 10 days. 12/17/20 12/27/20 Yes Theadora Rama Scales, PA-C  ferrous sulfate 325 (65 FE) MG tablet Take 1 tablet (325 mg total) by mouth every other day. 05/17/20 07/16/20  Myna Hidalgo, DO  triamterene-hydrochlorothiazide (DYAZIDE) 37.5-25 MG capsule 1 tab by mouth daily in morning with meal 08/05/20   Julieanne Manson, MD  fluticasone [facility]) 50 MCG/ACT nasal spray Place 1 spray into both nostrils daily. Patient not taking: Reported on 07/03/2019 04/18/19 07/03/19  Henderly, Britni A, PA-C   Family History Family History  Problem Relation Age of Onset   Diabetes  Mother    Hypertension Mother    Social History Social History   Tobacco Use   Smoking status: Some Days    Packs/day: 0.25    Types: Cigarettes    Last attempt to quit: 01/22/2020    Years since quitting: 0.9   Smokeless tobacco: Never  Vaping Use   Vaping Use: Never used  Substance Use Topics   Alcohol use: No   Drug use: Yes    Types: Marijuana    Comment: 05/06/20   Allergies   Patient has no known allergies.  Review of Systems Review of Systems Pertinent findings noted in history of present illness.   Physical Exam Triage Vital Signs ED Triage Vitals  Enc Vitals Group     BP 11/19/20 0827 (!) 147/82      Pulse Rate 11/19/20 0827 72     Resp 11/19/20 0827 18     Temp 11/19/20 0827 98.3 F (36.8 C)     Temp Source 11/19/20 0827 Oral     SpO2 11/19/20 0827 98 %     Weight --      Height --      Head Circumference --      Peak Flow --      Pain Score 11/19/20 0826 5     Pain Loc --      Pain Edu? --      Excl. in [place]? --   No data found.  Updated Vital Signs BP 139/84 (BP Location: Right Arm)   Pulse 87   Temp 98.8 F (37.1 C) (Oral)   Resp 18   SpO2 93%   Physical Exam Vitals and nursing note reviewed.  Constitutional:      General: [name] not in acute distress.    Appearance: Normal appearance. [name] not ill-appearing.  HENT:     Head: Normocephalic and atraumatic.     Salivary Glands: Right salivary gland is not diffusely enlarged or tender. Left salivary gland is not diffusely enlarged or tender.     Right Ear: Ear canal and external ear normal. Decreased hearing noted. No drainage. A middle ear effusion is present. There is no impacted cerumen. Tympanic membrane is erythematous and bulging.     Left Ear: Tympanic membrane, ear canal and external ear normal. No drainage.  No middle ear effusion. There is no impacted cerumen. Tympanic membrane is not erythematous or bulging.     Nose: Nose normal. No nasal deformity, septal deviation, mucosal edema, congestion or rhinorrhea.     Right Turbinates: Not enlarged, swollen or pale.     Left Turbinates: Not enlarged, swollen or pale.     Right Sinus: No maxillary sinus tenderness or frontal sinus tenderness.     Left Sinus: No maxillary sinus tenderness or frontal sinus tenderness.     Mouth/Throat:     Lips: Pink. No lesions.     Mouth: Mucous membranes are moist. No oral lesions.     Pharynx: Oropharynx is clear. Uvula midline. No posterior oropharyngeal erythema or uvula swelling.     Tonsils: No tonsillar exudate. 0 on the right. 0 on the left.  Eyes:     General: Lids are normal.        Right eye: No discharge.         Left eye: No discharge.     Extraocular Movements: Extraocular movements intact.     Conjunctiva/sclera: Conjunctivae normal.     Right eye: Right conjunctiva is not injected.  Left eye: Left conjunctiva is not injected.  Neck:     Trachea: Trachea and phonation normal.  Cardiovascular:     Rate and Rhythm: Normal rate and regular rhythm.     Pulses: Normal pulses.     Heart sounds: Normal heart sounds. No murmur heard.   No friction rub. No gallop.  Pulmonary:     Effort: Pulmonary effort is normal. No accessory muscle usage, prolonged expiration or respiratory distress.     Breath sounds: Normal breath sounds. No stridor, decreased air movement or transmitted upper airway sounds. No decreased breath sounds, wheezing, rhonchi or rales.  Chest:     Chest wall: No tenderness.  Musculoskeletal:        General: Normal range of motion.     Cervical back: Normal range of motion and neck supple. Normal range of motion.  Lymphadenopathy:     Cervical: Cervical adenopathy present.     Right cervical: Superficial cervical adenopathy present.  Skin:    General: Skin is warm and dry.     Findings: No erythema or rash.  Neurological:     General: No focal deficit present.     Mental Status: [name] alert and oriented to person, place, and time.  Psychiatric:        Mood and Affect: Mood normal.        Behavior: Behavior normal.    Visual Acuity Right Eye Distance:   Left Eye Distance:   Bilateral Distance:    Right Eye Near:   Left Eye Near:    Bilateral Near:     UC Couse / Diagnostics / Procedures:    EKG  Radiology No results found.  Procedures Procedures (including critical care time)  UC Diagnoses / Final Clinical Impressions(s)    Final diagnoses:  Acute suppurative otitis media of right ear   I have reviewed the triage vital signs and the nursing notes.  Pertinent labs & imaging results that were available during my care of the patient were reviewed by me and  considered in my medical decision making (see chart for details).    Acute right suppurative otitis media, begin cefdinir x10 days.  Return precautions advised.  Patient/parent/caregiver verbalized understanding and agreement of plan as discussed.  All questions were addressed during visit.  Please see discharge instructions below for further details of plan.  ED Prescriptions     Medication Sig Dispense Auth. Provider   cefdinir (OMNICEF) 300 MG capsule Take 1 capsule (300 mg total) by mouth 2 (two) times daily for 10 days. 20 capsule Lynden Oxford Scales, PA-C      PDMP not reviewed this encounter.  Pending results:  Labs Reviewed - No data to display   Medications Ordered in UC: Medications - No data to display  Discharge Instructions:   Discharge Instructions      Please begin cefdinir twice daily to treat your acute ear infection.  If you have not had resolution of your symptoms in the next 10 days, please follow-up with your primary care physician for reevaluation.  If your symptoms worsen over the next 10 days while you are taking antibiotics, please seek emergency medical attention.      Disposition Upon Discharge:  Patient presented with an acute illness with associated systemic symptoms and significant discomfort requiring urgent management. In my opinion, this is a condition that a prudent lay person (someone who possesses an average knowledge of health and medicine) may potentially expect to result in complications if not  addressed urgently such as respiratory distress, impairment of bodily function or dysfunction of bodily organs.   Routine symptom specific, illness specific and/or disease specific instructions were discussed with the patient and/or caregiver at length.   As such, the patient has been evaluated and assessed, work-up was performed and treatment was provided in alignment with urgent care protocols and evidence based medicine.   Patient/parent/caregiver has been advised that the patient may require follow up for further testing and treatment if the symptoms continue in spite of treatment, as clinically indicated and appropriate.  If the patient was tested for COVID-19, Influenza and/or RSV, then the patient/parent/guardian was advised to isolate at home pending the results of his/her diagnostic coronavirus test and potentially longer if they're positive. I have also advised pt that if his/her COVID-19 test returns positive, it's recommended to self-isolate for at least 10 days after symptoms first appeared AND until fever-free for 24 hours without fever reducer AND other symptoms have improved or resolved. Discussed self-isolation recommendations as well as instructions for household member/close contacts as per the [facility] and [place] DHHS, and also gave patient the COVID packet with this information.  Patient/parent/caregiver has been advised to return to the [facility] or PCP in 3-5 days if no better; to PCP or the Emergency Department if new signs and symptoms develop, or if the current signs or symptoms continue to change or worsen for further workup, evaluation and treatment as clinically indicated and appropriate  The patient will follow up with their current PCP if and as advised. If the patient does not currently have a PCP we will assist them in obtaining one.   The patient may need specialty follow up if the symptoms continue, in spite of conservative treatment and management, for further workup, evaluation, consultation and treatment as clinically indicated and appropriate.  Condition: stable for discharge home Home: take medications as prescribed; routine discharge instructions as discussed; follow up as advised.    Theadora Rama Scales, PA-C 12/17/20 1857

## 2020-12-22 IMAGING — US US OB < 14 WEEKS - US OB TV
1 series · 15 of 28 positions shown · non-contrast
Comparison: None.

CLINICAL DATA: Vaginal bleeding, pelvic pain, quantitative beta HCG
18,522

EXAM:
OBSTETRIC <14 WK US AND TRANSVAGINAL OB US
TECHNIQUE: Both transabdominal and transvaginal ultrasound examinations were
performed for complete evaluation of the gestation as well as the
maternal uterus, adnexal regions, and pelvic cul-de-sac.
Transvaginal technique was performed to assess early pregnancy.

[Series 1: us ob < 14 weeks - us ob tv · 34 acquisitions, 15 frames shown]
[im 1/34]
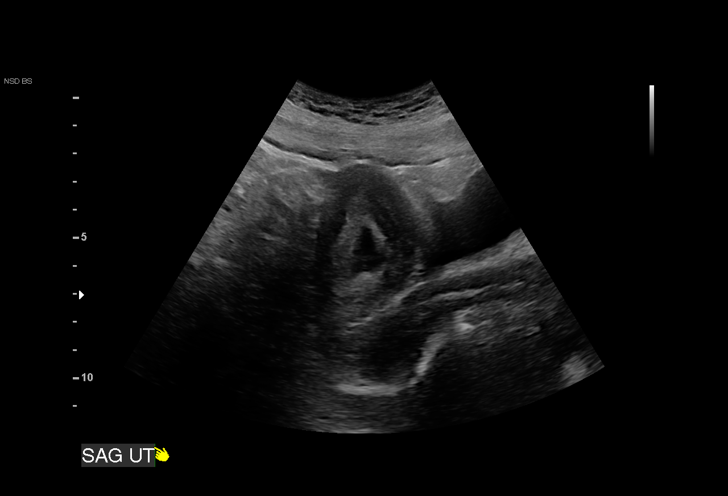
[im 3/34]
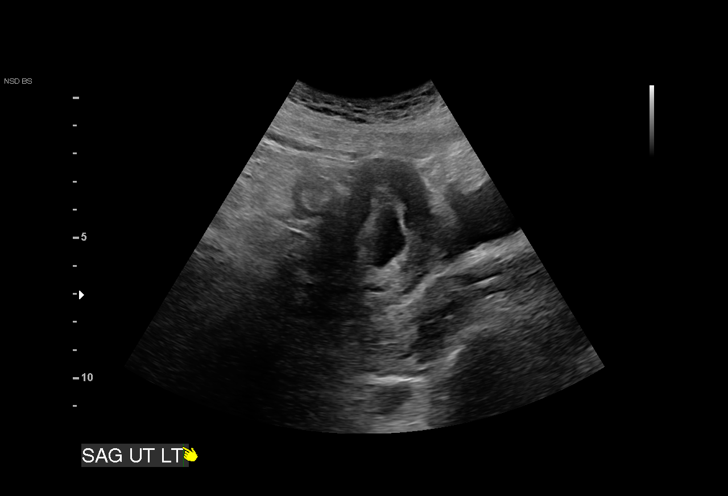
[im 5/34]
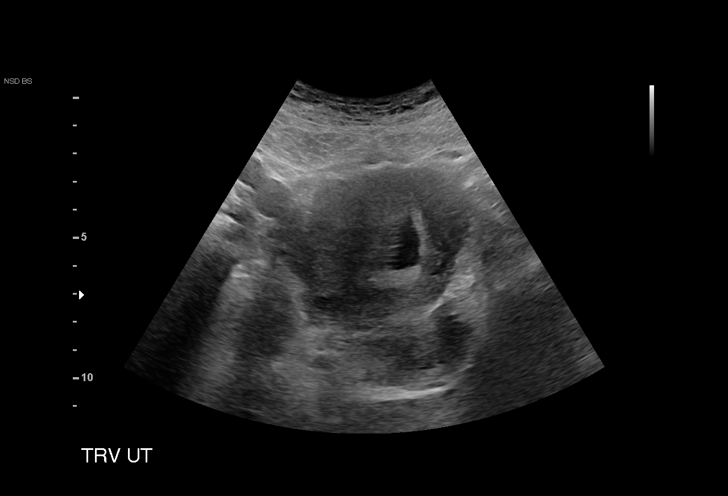
[im 8/34]
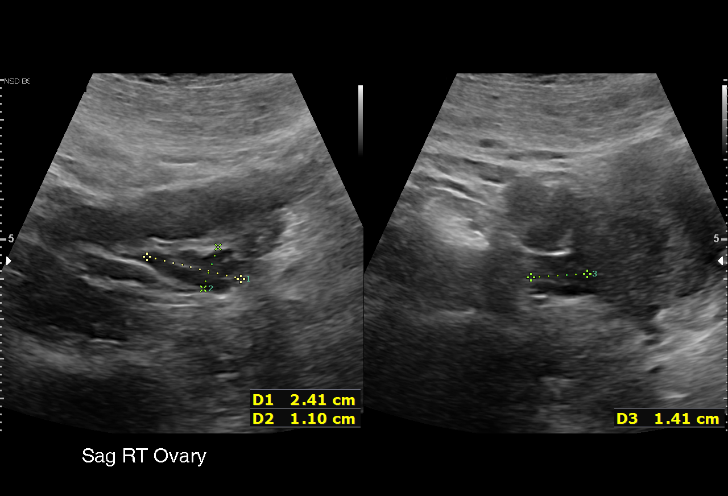
[im 10/34]
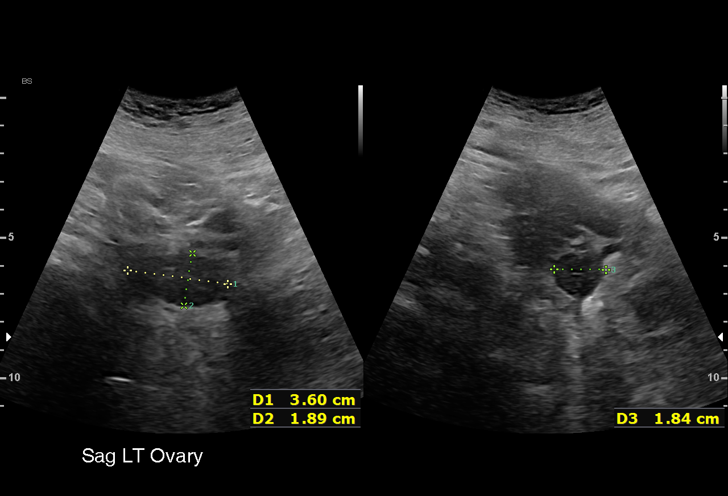
[im 13/34]
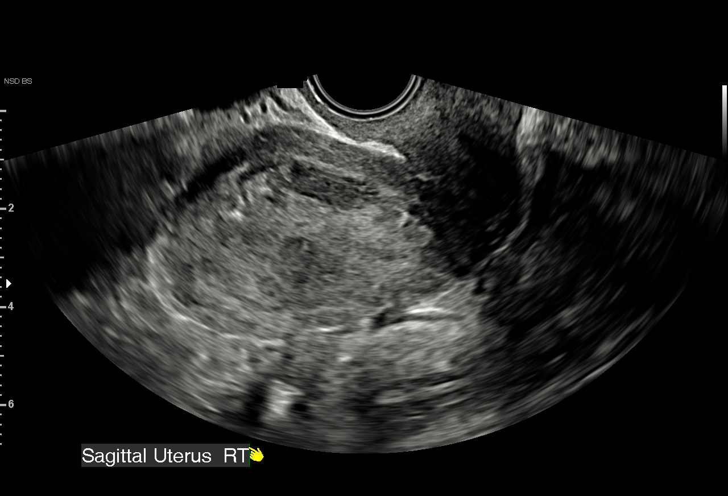
[im 15/34]
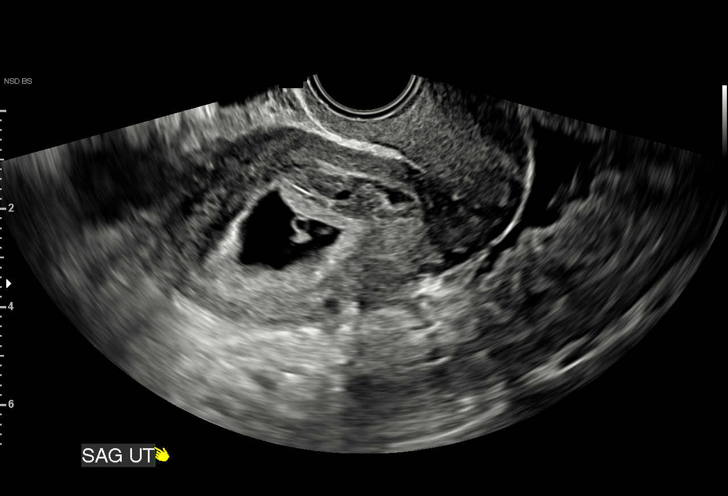
[im 18/34]
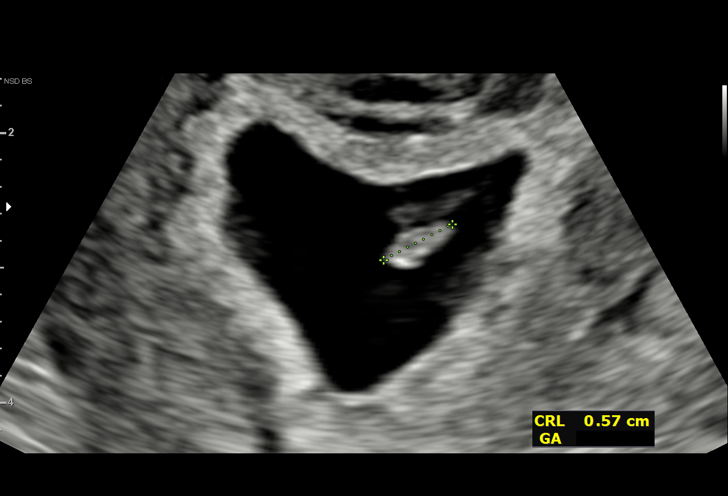
[im 19/34]
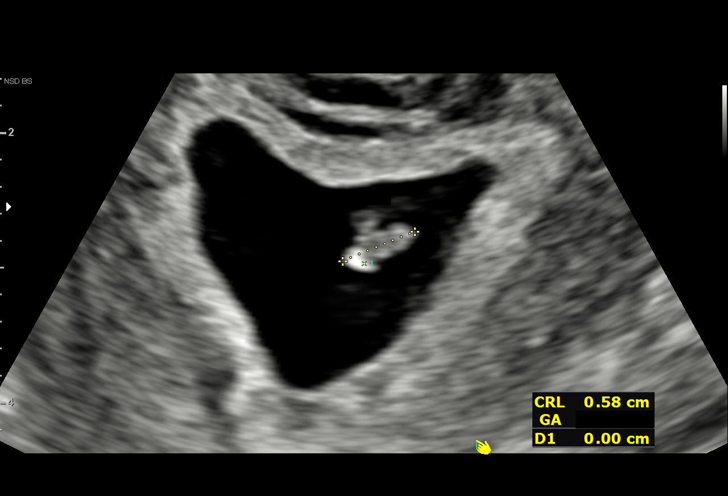
[im 21/34]
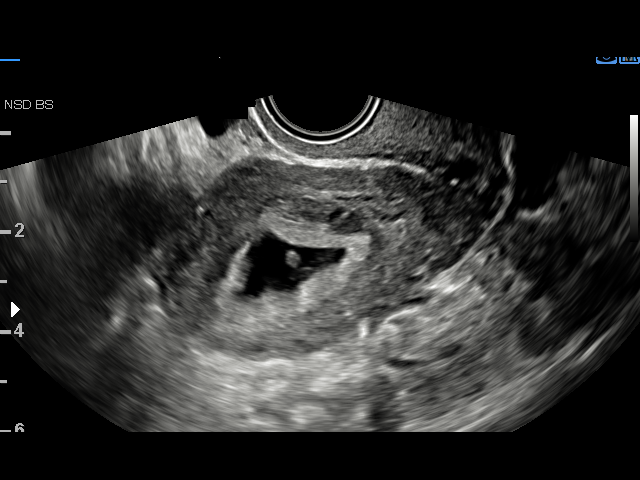
[im 24/34]
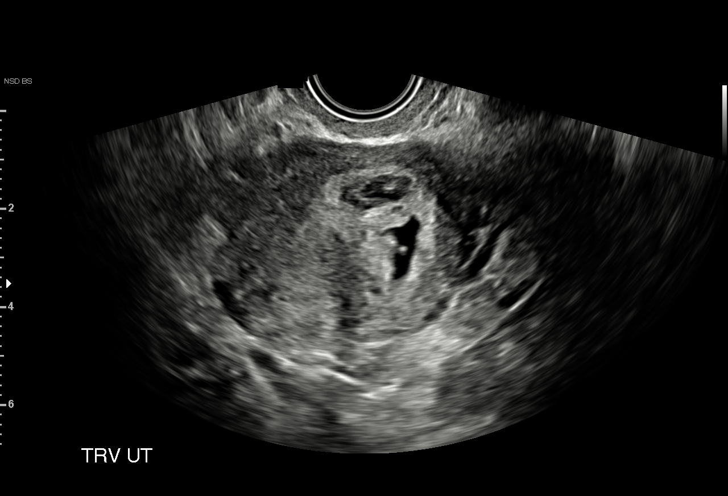
[im 26/34]
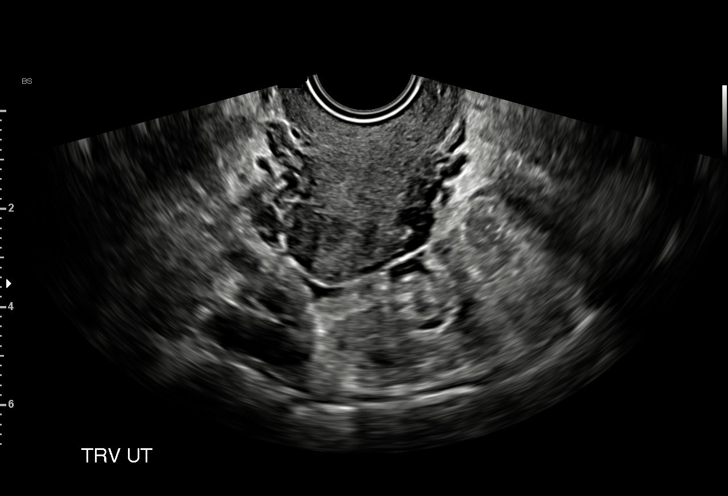
[im 29/34]
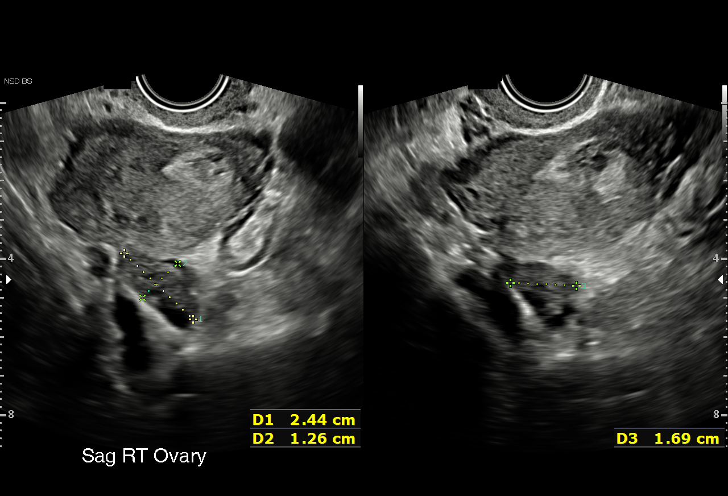
[im 31/34]
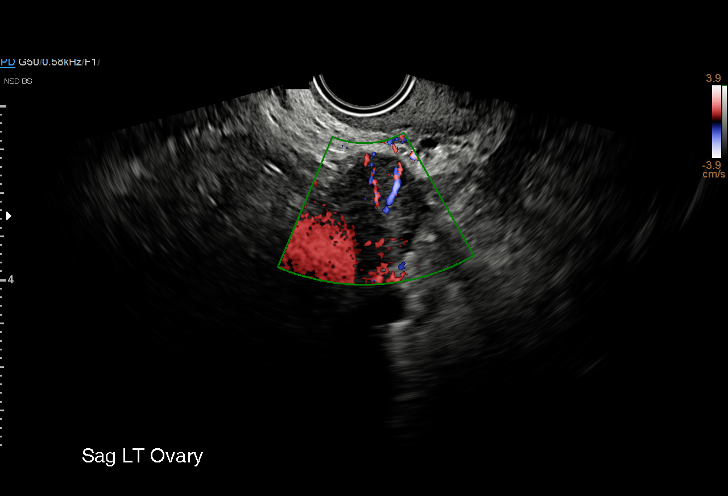
[im 34/34]
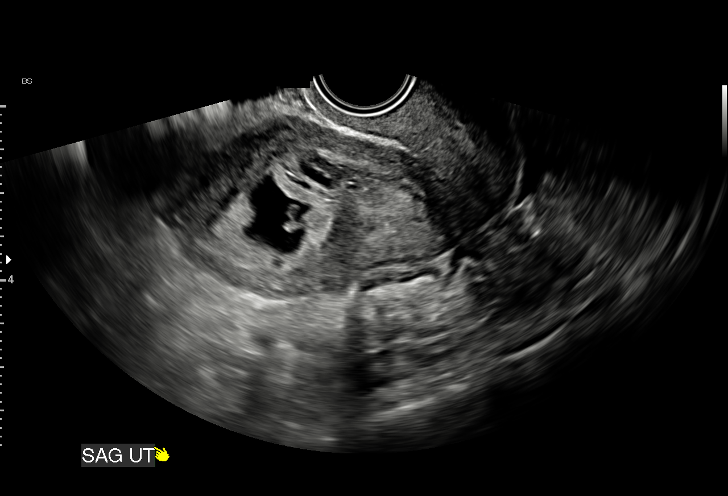

[15 of 28 positions shown; findings below may reference images not displayed]

FINDINGS: Intrauterine gestational sac: Single

Yolk sac:  Visualized.

Embryo:  Visualized.

Cardiac Activity: Visualized.

Heart Rate: 118 bpm

CRL: 5.6 mm   6 w   2 d

Subchorionic hemorrhage: There is a large subchorionic hemorrhage
along the anterior and inferior margin of the gestational sac.

Maternal uterus/adnexae: The uterus is anteverted. Right ovary
measures 2.4 x 1.3 x 1.7 cm and the left ovary measures 2.7 x 2.1 x
2.0 cm. There is trace pelvic free fluid.
IMPRESSION: 1. Single live intrauterine pregnancy as above, estimated age 6
weeks and 2 days.
2. Large subchorionic hemorrhage as above.
3. Trace pelvic free fluid.

## 2021-01-19 ENCOUNTER — Encounter (HOSPITAL_COMMUNITY): Payer: Self-pay

## 2021-01-19 ENCOUNTER — Ambulatory Visit (HOSPITAL_COMMUNITY)
Admission: EM | Admit: 2021-01-19 | Discharge: 2021-01-19 | Disposition: A | Discharge status: Home / Self Care | Payer: Medicaid Other | Attending: Internal Medicine | Admitting: Internal Medicine

## 2021-01-19 ENCOUNTER — Emergency Department (HOSPITAL_COMMUNITY)
Admission: EM | Admit: 2021-01-19 | Discharge: 2021-01-19 | Discharge status: Against Medical Advice | Payer: Medicaid Other

## 2021-01-19 DIAGNOSIS — R109 Unspecified abdominal pain: Secondary | ICD-10-CM | POA: Diagnosis not present

## 2021-01-19 DIAGNOSIS — R112 Nausea with vomiting, unspecified: Secondary | ICD-10-CM

## 2021-01-19 LAB — POC INFLUENZA A AND B ANTIGEN (URGENT CARE ONLY)
INFLUENZA A ANTIGEN, POC: NEGATIVE
INFLUENZA B ANTIGEN, POC: NEGATIVE

## 2021-01-19 MED ORDER — ONDANSETRON 4 MG PO TBDP
8.0000 mg | ORAL_TABLET | Freq: Once | ORAL | Status: AC
  Administered 2021-01-19: 19:00:00 8 mg via ORAL

## 2021-01-19 MED ORDER — ONDANSETRON 4 MG PO TBDP
ORAL_TABLET | ORAL | Status: AC
  Filled 2021-01-19: qty 2

## 2021-01-19 NOTE — Discharge Instructions (Signed)
Go to ER right now to have more tests that we are unable to do here since you are in such sever pain and may also need hydration

## 2021-01-19 NOTE — ED Triage Notes (Signed)
Pt presents to the office for fever,body aches and vomiting that started this morning.

## 2021-01-19 NOTE — ED Notes (Signed)
LWBS 

## 2021-01-19 NOTE — ED Provider Notes (Signed)
[facility]    CSN: [id number] Arrival date & time: 01/19/21  1719      History   Chief Complaint Chief Complaint  Patient presents with   Emesis    Body Aches x 1 day    HPI Jacqueline Shepherd is a 30 y.o. female who presents with 10+ or more episodes of vomiting and severe generalized abdominal pain and all over body pain since this and and is getting worse.  She has been chilling, but when she checked her temp it does not show a fever. She denies URI, URI, constipation or diarrhea symptoms.     Past Medical History:  Diagnosis Date   Abdominal pain    Cannabinoid hyperemesis syndrome    Chlamydia infection complicating pregnancy in second trimester 08/18/2018   Negative test of cure on 08/06/18   Transient hypertension of pregnancy 10/12/2019   Weight loss 10/12/2019    Patient Active Problem List   Diagnosis Date Noted   Cannabis hyperemesis syndrome concurrent with and due to cannabis abuse (HCC) 06/04/2020   IUD (intrauterine device) in place 05/15/2020   Fetal malpresentation 05/14/2020   Preeclampsia 05/10/2020   Antepartum mild preeclampsia 05/07/2020   Carpal tunnel syndrome during pregnancy 04/15/2020   Gestational diabetes mellitus (GDM), antepartum 04/13/2020   Dysuria during pregnancy in second trimester 02/23/2020   Abnormal TSH 12/09/2019   Hyperemesis gravidarum, antepartum 11/22/2019   Tobacco use in pregnancy, antepartum, first trimester 11/11/2019   Other social stressor 11/11/2019   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   Obesity in pregnancy 10/12/2019   BMI 40.0-44.9, adult (HCC) 10/12/2019   Cannabinoid hyperemesis syndrome    Chlamydia infection affecting pregnancy in second trimester 08/18/2018   Rubella non-immune status, antepartum 08/18/2018    Past Surgical History:  Procedure Laterality Date   CESAREAN SECTION N/A 05/14/2020   Procedure: CESAREAN SECTION;  Surgeon: Conan Bowens, MD;  Location: [facility];   Service: Obstetrics;  Laterality: N/A;  Primary C/S malpresentation & IUD placement   WISDOM TOOTH EXTRACTION      OB History     Gravida  2   Para  1   Term  1   Preterm      AB  1   Living  1      SAB  1   IAB      Ectopic      Multiple  0   Live Births  1            Home Medications    Prior to Admission medications   Medication Sig Start Date End Date Taking? Authorizing Provider  ferrous sulfate 325 (65 FE) MG tablet Take 1 tablet (325 mg total) by mouth every other day. 05/17/20 07/16/20  Myna Hidalgo, DO  triamterene-hydrochlorothiazide (DYAZIDE) 37.5-25 MG capsule 1 tab by mouth daily in morning with meal 08/05/20   Julieanne Manson, MD  fluticasone [facility]) 50 MCG/ACT nasal spray Place 1 spray into both nostrils daily. Patient not taking: Reported on 07/03/2019 04/18/19 07/03/19  Henderly, Britni A, PA-C    Family History Family History  Problem Relation Age of Onset   Diabetes Mother    Hypertension Mother     Social History Social History   Tobacco Use   Smoking status: Some Days    Packs/day: 0.25    Types: Cigarettes    Last attempt to quit: 01/22/2020    Years since quitting: 0.9   Smokeless tobacco: Never  Vaping Use  Vaping Use: Never used  Substance Use Topics   Alcohol use: No   Drug use: Yes    Types: Marijuana    Comment: 05/06/20     Allergies   Patient has no known allergies.   Review of Systems Review of Systems  Constitutional:  Positive for chills. Negative for appetite change, diaphoresis and fever.  Gastrointestinal:  Positive for abdominal distention, abdominal pain, nausea and vomiting. Negative for constipation and diarrhea.  Genitourinary:  Negative for difficulty urinating, dysuria and frequency.    Physical Exam Triage Vital Signs ED Triage Vitals  Enc Vitals Group     BP 01/19/21 1904 (!) 172/82     Pulse Rate 01/19/21 1904 (!) 48     Resp 01/19/21 1904 20     Temp 01/19/21 1904 98.1 F (36.7  C)     Temp src --      SpO2 01/19/21 1904 100 %     Weight --      Height --      Head Circumference --      Peak Flow --      Pain Score 01/19/21 1905 10     Pain Loc --      Pain Edu? --      Excl. in [place]? --    No data found.  Updated Vital Signs BP (!) 172/82 (BP Location: Left Arm)    Pulse (!) 48    Temp 98.1 F (36.7 C)    Resp 20    LMP 12/26/2020 (Approximate)    SpO2 100%   Visual Acuity Right Eye Distance:   Left Eye Distance:   Bilateral Distance:    Right Eye Near:   Left Eye Near:    Bilateral Near:     Physical Exam Vitals and nursing note reviewed.  Constitutional:      General: [name] in acute distress.     Appearance: [name] obese. [name] ill-appearing.     Comments: Moans in pain while laying and worse when she moves   HENT:     Right Ear: External ear normal.     Left Ear: External ear normal.  Eyes:     General: No scleral icterus.    Conjunctiva/sclera: Conjunctivae normal.  Pulmonary:     Effort: Pulmonary effort is normal.  Abdominal:     Palpations: Abdomen is soft.     Comments: Hypoactive BS, she is very tender just from applying my stethoscope on her abdomen and guarded with palpation all over.   Skin:    General: Skin is warm and dry.     Findings: No rash.  Neurological:     Mental Status: [name] alert and oriented to person, place, and time.  Psychiatric:        Mood and Affect: Mood normal.        Behavior: Behavior normal.        Thought Content: Thought content normal.        Judgment: Judgment normal.     UC Treatments / Results  Labs (all labs ordered are listed, but only abnormal results are displayed) Labs Reviewed  POC INFLUENZA A AND B ANTIGEN (URGENT CARE ONLY)    EKG   Radiology No results found.  Procedures Procedures (including critical care time)  Medications Ordered in UC Medications  ondansetron (ZOFRAN-ODT) disintegrating tablet 8 mg (8 mg Oral Given 01/19/21 1916)    Initial Impression /  Assessment and Plan / UC Course  I have reviewed the triage vital signs and the nursing notes. Flu A&B are negative She was given Zofran 8 mg ODT and she did not vomit while here.  Due to such severe abdominal pain and guarding during exam, she needs more test done than what we can offer her here. She was sent to ER tonight      Final Clinical Impressions(s) / UC Diagnoses   Final diagnoses:  Nausea and vomiting, unspecified vomiting type  Acute abdominal pain     Discharge Instructions      Go to ER right now to have more tests that we are unable to do here since you are in such sever pain and may also need hydration      ED Prescriptions   None    PDMP not reviewed this encounter.   Shelby Mattocks, Hershal Coria 01/19/21 1958

## 2021-01-20 ENCOUNTER — Encounter (HOSPITAL_BASED_OUTPATIENT_CLINIC_OR_DEPARTMENT_OTHER): Payer: Self-pay

## 2021-01-20 ENCOUNTER — Other Ambulatory Visit: Payer: Self-pay

## 2021-01-20 ENCOUNTER — Emergency Department (HOSPITAL_BASED_OUTPATIENT_CLINIC_OR_DEPARTMENT_OTHER)
Admission: EM | Admit: 2021-01-20 | Discharge: 2021-01-20 | Disposition: A | Discharge status: Home / Self Care | Payer: Medicaid Other | Attending: Emergency Medicine | Admitting: Emergency Medicine

## 2021-01-20 DIAGNOSIS — R112 Nausea with vomiting, unspecified: Secondary | ICD-10-CM | POA: Diagnosis not present

## 2021-01-20 DIAGNOSIS — E876 Hypokalemia: Secondary | ICD-10-CM | POA: Insufficient documentation

## 2021-01-20 DIAGNOSIS — R1084 Generalized abdominal pain: Secondary | ICD-10-CM | POA: Diagnosis not present

## 2021-01-20 DIAGNOSIS — R001 Bradycardia, unspecified: Secondary | ICD-10-CM | POA: Insufficient documentation

## 2021-01-20 DIAGNOSIS — F17210 Nicotine dependence, cigarettes, uncomplicated: Secondary | ICD-10-CM | POA: Diagnosis not present

## 2021-01-20 LAB — COMPREHENSIVE METABOLIC PANEL
ALT: 15 U/L (ref 0–44)
AST: 16 U/L (ref 15–41)
Albumin: 4.1 g/dL (ref 3.5–5.0)
Alkaline Phosphatase: 58 U/L (ref 38–126)
Anion gap: 11 (ref 5–15)
BUN: 14 mg/dL (ref 6–20)
CO2: 23 mmol/L (ref 22–32)
Calcium: 9.1 mg/dL (ref 8.9–10.3)
Chloride: 101 mmol/L (ref 98–111)
Creatinine, Ser: 0.67 mg/dL (ref 0.44–1.00)
GFR, Estimated: >60 mL/min (ref >60)
Glucose, Bld: 117 mg/dL — ABNORMAL HIGH (ref 70–99)
Potassium: 3.2 mmol/L — ABNORMAL LOW (ref 3.5–5.1)
Sodium: 135 mmol/L (ref 135–145)
Total Bilirubin: 0.6 mg/dL (ref 0.3–1.2)
Total Protein: 8.2 g/dL — ABNORMAL HIGH (ref 6.5–8.1)

## 2021-01-20 LAB — CBC WITH DIFFERENTIAL/PLATELET
Abs Immature Granulocytes: 0.04 10*3/uL (ref 0.00–0.07)
Basophils Absolute: 0 10*3/uL (ref 0.0–0.1)
Basophils Relative: 0 %
Eosinophils Absolute: 0 10*3/uL (ref 0.0–0.5)
Eosinophils Relative: 0 %
HCT: 44.3 % (ref 36.0–46.0)
Hemoglobin: 14.7 g/dL (ref 12.0–15.0)
Immature Granulocytes: 0 %
Lymphocytes Relative: 14 %
Lymphs Abs: 1.4 10*3/uL (ref 0.7–4.0)
MCH: 26.3 pg (ref 26.0–34.0)
MCHC: 33.2 g/dL (ref 30.0–36.0)
MCV: 79.1 fL — ABNORMAL LOW (ref 80.0–100.0)
Monocytes Absolute: 0.6 10*3/uL (ref 0.1–1.0)
Monocytes Relative: 6 %
Neutro Abs: 8 10*3/uL — ABNORMAL HIGH (ref 1.7–7.7)
Neutrophils Relative %: 80 %
Platelets: 164 10*3/uL (ref 150–400)
RBC: 5.6 MIL/uL — ABNORMAL HIGH (ref 3.87–5.11)
RDW: 13.3 % (ref 11.5–15.5)
WBC: 10 10*3/uL (ref 4.0–10.5)
nRBC: 0 % (ref 0.0–0.2)

## 2021-01-20 LAB — URINALYSIS, MICROSCOPIC (REFLEX): WBC, UA: NONE SEEN WBC/hpf (ref 0–5)

## 2021-01-20 LAB — URINALYSIS, ROUTINE W REFLEX MICROSCOPIC
Glucose, UA: NEGATIVE mg/dL
Hgb urine dipstick: NEGATIVE
Ketones, ur: >=80 mg/dL — AB
Leukocytes,Ua: NEGATIVE
Nitrite: NEGATIVE
Protein, ur: 30 mg/dL — AB
Specific Gravity, Urine: >=1.03 (ref 1.005–1.030)
pH: 6.5 (ref 5.0–8.0)

## 2021-01-20 LAB — PREGNANCY, URINE: Preg Test, Ur: NEGATIVE

## 2021-01-20 LAB — LIPASE, BLOOD: Lipase: 24 U/L (ref 11–51)

## 2021-01-20 LAB — MAGNESIUM: Magnesium: 1.9 mg/dL (ref 1.7–2.4)

## 2021-01-20 MED ORDER — POTASSIUM CHLORIDE ER 10 MEQ PO TBCR: 10.0000 meq | EXTENDED_RELEASE_TABLET | Freq: Every day | ORAL | 0 refills | Status: DC

## 2021-01-20 MED ORDER — LACTATED RINGERS IV BOLUS
1000.0000 mL | Freq: Once | INTRAVENOUS | Status: AC
  Administered 2021-01-20: 13:00:00 1000 mL via INTRAVENOUS

## 2021-01-20 MED ORDER — POTASSIUM CHLORIDE CRYS ER 20 MEQ PO TBCR
40.0000 meq | EXTENDED_RELEASE_TABLET | Freq: Once | ORAL | Status: AC
  Administered 2021-01-20: 15:00:00 40 meq via ORAL
  Filled 2021-01-20: qty 2

## 2021-01-20 MED ORDER — ONDANSETRON HCL 4 MG/2ML IJ SOLN
4.0000 mg | Freq: Once | INTRAMUSCULAR | Status: AC
  Administered 2021-01-20: 13:00:00 4 mg via INTRAVENOUS
  Filled 2021-01-20: qty 2

## 2021-01-20 MED ORDER — DROPERIDOL 2.5 MG/ML IJ SOLN
2.5000 mg | Freq: Once | INTRAMUSCULAR | Status: AC
  Administered 2021-01-20: 14:00:00 2.5 mg via INTRAVENOUS
  Filled 2021-01-20: qty 2

## 2021-01-20 NOTE — ED Triage Notes (Addendum)
BIB EMS. N/V/D x 2 days. Seen at [facility] yesterday, states negative flu test. Unable to tolerate PO. Generalized abdominal pain.

## 2021-01-20 NOTE — ED Notes (Signed)
Pt tolerated PO fluids

## 2021-01-20 NOTE — ED Provider Notes (Signed)
[facility] EMERGENCY DEPARTMENT Provider Note   CSN: [id number] Arrival date & time: 01/20/21  1122     History Chief Complaint  Patient presents with   Vomiting    Jacqueline Shepherd is a 30 y.o. female with a history of cannabinoid hyperemesis syndrome.  Presents to the emergency department with a complaint of nausea, vomiting, and generalized abdominal pain.  Per chart review patient was seen at urgent care yesterday and had improvement in nausea and vomiting after being given Zofran.  Patient was advised to go to the emergency department for further evaluation however left without being seen by provider.  Patient states that she has vomited approximately 4 times in the last 24 hours.  Patient describes emesis as bilious and stomach contents.  Endorses seeing a few streaks of blood in her emesis however denies any coffee-ground emesis or hematemesis.  Patient denies any Zofran use to help control her nausea and vomiting.  Patient reports that abdominal pain is generalized.  Patient rates pain 10/10 on the pain scale.  Patient denies any aggravating factors or alleviating factors.  Patient reports that this episode of nausea, vomiting, and abdominal pain feels very similar to previous episodes of cannabinoid hyperemesis syndrome she has had in the past patient denies any EtOH use.  States that she does smoke marijuana.  LMP 12/23/2020.  Patient denies any constipation, melena, blood in stool, dysuria, hematuria, urinary urgency, vaginal pain, vaginal bleeding, vaginal discharge, fever, chills.  HPI     Past Medical History:  Diagnosis Date   Abdominal pain    Cannabinoid hyperemesis syndrome    Chlamydia infection complicating pregnancy in second trimester 08/18/2018   Negative test of cure on 08/06/18   Transient hypertension of pregnancy 10/12/2019   Weight loss 10/12/2019    Patient Active Problem List   Diagnosis Date Noted   Cannabis hyperemesis syndrome concurrent  with and due to cannabis abuse ([place]) 06/04/2020   IUD (intrauterine device) in place 05/15/2020   Fetal malpresentation 05/14/2020   Preeclampsia 05/10/2020   Antepartum mild preeclampsia 05/07/2020   Carpal tunnel syndrome during pregnancy 04/15/2020   Gestational diabetes mellitus (GDM), antepartum 04/13/2020   Dysuria during pregnancy in second trimester 02/23/2020   Abnormal TSH 12/09/2019   Hyperemesis gravidarum, antepartum 11/22/2019   Tobacco use in pregnancy, antepartum, first trimester 11/11/2019   Other social stressor 11/11/2019   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   Obesity in pregnancy 10/12/2019   BMI 40.0-44.9, adult ([place]) 10/12/2019   Cannabinoid hyperemesis syndrome    Chlamydia infection affecting pregnancy in second trimester 08/18/2018   Rubella non-immune status, antepartum 08/18/2018    Past Surgical History:  Procedure Laterality Date   CESAREAN SECTION N/A 05/14/2020   Procedure: CESAREAN SECTION;  Surgeon: Sloan Leiter, MD;  Location: [facility] LD ORS;  Service: Obstetrics;  Laterality: N/A;  Primary C/S malpresentation & IUD placement   WISDOM TOOTH EXTRACTION       OB History     Gravida  2   Para  1   Term  1   Preterm      AB  1   Living  1      SAB  1   IAB      Ectopic      Multiple  0   Live Births  1           Family History  Problem Relation Age of Onset   Diabetes Mother    Hypertension Mother  Social History   Tobacco Use   Smoking status: Some Days    Packs/day: 0.25    Types: Cigarettes    Last attempt to quit: 01/22/2020    Years since quitting: 0.9   Smokeless tobacco: Never  Vaping Use   Vaping Use: Never used  Substance Use Topics   Alcohol use: No   Drug use: Yes    Types: Marijuana    Comment: 05/06/20    Home Medications Prior to Admission medications   Medication Sig Start Date End Date Taking? Authorizing Provider  ferrous sulfate 325 (65 FE) MG tablet Take 1 tablet (325 mg  total) by mouth every other day. 05/17/20 07/16/20  Janyth Pupa, DO  triamterene-hydrochlorothiazide (DYAZIDE) 37.5-25 MG capsule 1 tab by mouth daily in morning with meal 08/05/20   Mack Hook, MD  fluticasone [facility]) 50 MCG/ACT nasal spray Place 1 spray into both nostrils daily. Patient not taking: Reported on 07/03/2019 04/18/19 07/03/19  Henderly, Britni A, PA-C    Allergies    Patient has no known allergies.  Review of Systems   Review of Systems  Constitutional:  Negative for chills and fever.  HENT:  Negative for congestion, rhinorrhea and sore throat.   Eyes:  Negative for visual disturbance.  Respiratory:  Negative for cough and shortness of breath.   Cardiovascular:  Negative for chest pain.  Gastrointestinal:  Positive for abdominal pain, diarrhea, nausea and vomiting. Negative for abdominal distention, anal bleeding, blood in stool, constipation and rectal pain.  Genitourinary:  Negative for decreased urine volume, difficulty urinating, dysuria, flank pain, frequency, genital sores, hematuria, urgency, vaginal bleeding, vaginal discharge and vaginal pain.  Musculoskeletal:  Negative for back pain and neck pain.  Skin:  Negative for color change and rash.  Neurological:  Negative for dizziness, syncope, light-headedness and headaches.  Psychiatric/Behavioral:  Negative for confusion.    Physical Exam Updated Vital Signs BP (!) 173/104 (BP Location: Right Arm)    Pulse (!) 50    Temp 98.3 F (36.8 C) (Oral)    Resp 20    Ht _0  (1.676 m)    Wt 136.1 kg    LMP 12/26/2020 (Approximate)    SpO2 100%    BMI 48.42 kg/m   Physical Exam Vitals and nursing note reviewed.  Constitutional:      General: [name] not in acute distress.    Appearance: [name] morbidly obese. [name] not ill-appearing, toxic-appearing or diaphoretic.  HENT:     Head: Normocephalic.  Eyes:     General: No scleral icterus.       Right eye: No discharge.        Left eye: No discharge.   Cardiovascular:     Rate and Rhythm: Normal rate.  Pulmonary:     Effort: Pulmonary effort is normal.  Abdominal:     General: Abdomen is protuberant. Bowel sounds are normal. There is no distension. There are no signs of injury.     Palpations: Abdomen is soft. There is no mass or pulsatile mass.     Tenderness: There is generalized abdominal tenderness. There is no right CVA tenderness, left CVA tenderness, guarding or rebound.     Hernia: There is no hernia in the umbilical area or ventral area.  Skin:    General: Skin is warm and dry.  Neurological:     General: No focal deficit present.     Mental Status: [name] alert.  Psychiatric:  Behavior: Behavior is cooperative.    ED Results / Procedures / Treatments   Labs (all labs ordered are listed, but only abnormal results are displayed) Labs Reviewed  URINALYSIS, ROUTINE W REFLEX MICROSCOPIC - Abnormal; Notable for the following components:      Result Value   Bilirubin Urine SMALL (*)    Ketones, ur >=80 (*)    Protein, ur 30 (*)    All other components within normal limits  COMPREHENSIVE METABOLIC PANEL - Abnormal; Notable for the following components:   Potassium 3.2 (*)    Glucose, Bld 117 (*)    Total Protein 8.2 (*)    All other components within normal limits  CBC WITH DIFFERENTIAL/PLATELET - Abnormal; Notable for the following components:   RBC 5.60 (*)    MCV 79.1 (*)    Neutro Abs 8.0 (*)    All other components within normal limits  URINALYSIS, MICROSCOPIC (REFLEX) - Abnormal; Notable for the following components:   Bacteria, UA RARE (*)    All other components within normal limits  PREGNANCY, URINE  LIPASE, BLOOD  MAGNESIUM  CBC WITH DIFFERENTIAL/PLATELET    EKG EKG Interpretation  Date/Time:  Thursday January 20 2021 12:53:30 EST Ventricular Rate:  49 PR Interval:  151 QRS Duration: 113 QT Interval:  501 QTC Calculation: 453 R Axis:   85 Text Interpretation: Sinus bradycardia Incomplete  right bundle branch block No significant change since prior 8/21 Confirmed by Aletta Edouard [phone]) on 01/20/2021 12:55:03 PM  Radiology No results found.  Procedures Procedures   Medications Ordered in ED Medications  ondansetron (ZOFRAN) injection 4 mg (4 mg Intravenous Given 01/20/21 1240)  lactated ringers bolus 1,000 mL (0 mLs Intravenous Stopped 01/20/21 1503)  droperidol (INAPSINE) 2.5 MG/ML injection 2.5 mg (2.5 mg Intravenous Given 01/20/21 1358)  potassium chloride SA (KLOR-CON M) CR tablet 40 mEq (40 mEq Oral Given 01/20/21 1510)    ED Course  I have reviewed the triage vital signs and the nursing notes.  Pertinent labs & imaging results that were available during my care of the patient were reviewed by me and considered in my medical decision making (see chart for details).  Clinical Course as of 01/20/21 1619  Thu Jan 20, 2021  1343 Patient is adamant that there is no chance that she could be pregnant.  Discussed that Tylenol medication could have adverse effects on developing fetus.  Patient expresses understanding and is agreeable to receiving Haldol medication at this time [PB]    Clinical Course User Index [PB] Dyann Ruddle   MDM Rules/Calculators/A&P                          Alert 30 year old female no acute distress, nontoxic-appearing.  Presents to ED with chief plaint of nausea, vomiting, abdominal pain.  Symptoms onset yesterday.  Patient reports this episode feels similar to previous episodes of cannabinoid hyperemesis syndrome she has had in the past.  Patient does endorse marijuana use.  Denies any hematemesis or coffee-ground emesis.  Denies any history of abdominal surgeries.  On exam abdomen soft, nondistended, diffuse tenderness throughout abdomen, no guarding or rebound tenderness.  Will obtain abdominal work-up labs.  We will give patient Zofran and lactated Ringer fluid bolus.  Additionally will obtain EKG to assess for QTC haldol  medication as needed for refractory nausea and vomiting.  Patient reports continued vomiting after receiving Zofran.  QTC within normal limits.  Will give patient Haldol at  this time due to concern for possible cannabinoid hyperemesis syndrome.  Patient has no further episodes of vomiting after receiving Haldol.  Able to tolerate p.o. intake without difficulty.  CMP shows hypokalemia 3.2.  Magnesium within normal limits.  Patient given oral potassium.  Will prescribe patient with 5-day course of potassium.  Patient advised to follow-up with PCP or urgent care for repeat testing.  AST, ALT, alk phos, total bili all within normal limits.  Low suspicion for hepatobiliary disease at this time Lipase within normal limits, low suspicion for acute pancreatitis at this time Urine pregnancy test negative Urinalysis shows no signs of infection.  Ketones greater than 80 seen likely due to decreased p.o. intake from patient's nausea and vomiting.  On serial reexamination abdomen soft, nondistended, nontender.  We will plan to discharge patient at this time.  Patient given information on refraining from marijuana use due to concern for possible cannabinoid hyperemesis syndrome.  Patient given strict return precautions.  Discussed results, findings, treatment and follow up. Patient advised of return precautions. Patient verbalized understanding and agreed with plan.       Final Clinical Impression(s) / ED Diagnoses Final diagnoses:  Nausea and vomiting, unspecified vomiting type  Hypokalemia    Rx / DC Orders ED Discharge Orders          Ordered    potassium chloride (KLOR-CON) 10 MEQ tablet  Daily        01/20/21 1616             Dyann Ruddle 01/20/21 1622    Hayden Rasmussen, MD 01/20/21 1755

## 2021-01-20 NOTE — Discharge Instructions (Addendum)
You came to the emergency department today to be evaluated for your nausea, vomiting, and diarrhea.  Your vomiting resolved after receiving medication here in the emergency department.  Your lab work and repeat physical exam were reassuring.  Your lab work did show that your potassium was slightly low.  Due to this I have given you prescription for potassium.  Please take this medication as prescribed and follow-up with your primary care provider or urgent care next week for repeat testing of your potassium.  Today you received medications that may make you sleepy or impair your ability to make decisions.  For the next 24 hours please do not drive, operate heavy machinery, care for a small child with out another adult present, or perform any activities that may cause harm to you or someone else if you were to fall asleep or be impaired.   Get help right away if: You have pain in your chest, neck, arm, or jaw. You feel extremely weak or you faint. You have persistent vomiting. You have vomit that is bright red or looks like black coffee grounds. You have bloody or black stools (feces) or stools that look like tar. You have a severe headache, a stiff neck, or both. You have severe pain, cramping, or bloating in your abdomen. You have difficulty breathing, or you are breathing very quickly. Your heart is beating very quickly. Your skin feels cold and clammy. You feel confused. You have signs of dehydration, such as: Dark urine, very little urine, or no urine. Cracked lips. Dry mouth. Sunken eyes. Sleepiness. Weakness.

## 2021-02-21 ENCOUNTER — Other Ambulatory Visit (HOSPITAL_COMMUNITY): Payer: Self-pay

## 2021-05-31 ENCOUNTER — Encounter (HOSPITAL_BASED_OUTPATIENT_CLINIC_OR_DEPARTMENT_OTHER): Payer: Self-pay

## 2021-05-31 ENCOUNTER — Other Ambulatory Visit: Payer: Self-pay

## 2021-05-31 ENCOUNTER — Emergency Department (HOSPITAL_BASED_OUTPATIENT_CLINIC_OR_DEPARTMENT_OTHER)
Admission: EM | Admit: 2021-05-31 | Discharge: 2021-05-31 | Disposition: A | Discharge status: Home / Self Care | Payer: Medicaid Other | Attending: Emergency Medicine | Admitting: Emergency Medicine

## 2021-05-31 DIAGNOSIS — S01111A Laceration without foreign body of right eyelid and periocular area, initial encounter: Secondary | ICD-10-CM | POA: Insufficient documentation

## 2021-05-31 DIAGNOSIS — W228XXA Striking against or struck by other objects, initial encounter: Secondary | ICD-10-CM | POA: Insufficient documentation

## 2021-05-31 DIAGNOSIS — F17210 Nicotine dependence, cigarettes, uncomplicated: Secondary | ICD-10-CM | POA: Insufficient documentation

## 2021-05-31 DIAGNOSIS — S0993XA Unspecified injury of face, initial encounter: Secondary | ICD-10-CM | POA: Diagnosis present

## 2021-05-31 DIAGNOSIS — S0083XA Contusion of other part of head, initial encounter: Secondary | ICD-10-CM

## 2021-05-31 LAB — PREGNANCY, URINE: Preg Test, Ur: NEGATIVE

## 2021-05-31 MED ORDER — NAPROXEN 250 MG PO TABS
500.0000 mg | ORAL_TABLET | Freq: Once | ORAL | Status: AC
  Administered 2021-05-31: 500 mg via ORAL
  Filled 2021-05-31: qty 2

## 2021-05-31 NOTE — ED Triage Notes (Addendum)
Pt reports she was getting into her car when she hit the top of the metal door frame with the right side of her face. She has an abrasion to right orbital area "where the bone is" below outer aspect of right eyebrow. Bleeding controlled at this time.  ?

## 2021-05-31 NOTE — ED Provider Notes (Signed)
? ?[facility] ?Provider Note: Jacqueline Dell, MD, FACEP ? ?CSN: [id number] ?MRN: [id number] ?ARRIVAL: 05/31/21 at 2255 ?ROOM: [facility] ? ? ?CHIEF COMPLAINT  ?Facial Laceration ? ? ?HISTORY OF PRESENT ILLNESS  ?05/31/21 11:10 PM ?Jacqueline Shepherd is a 31 y.o. female who was getting into her car just prior to arrival when she hit the top of the metal door frame with the right side of her face.  She has swelling and a small laceration overlying her right eyebrow.  She rates associated pain as a 10 out of 10, worse with palpation.  The wound is hemostatic.  She has no pain in her globes and she has no diplopia or blurred vision.  She had a tetanus booster in February of this year. ? ? ?Past Medical History:  ?Diagnosis Date  ? Abdominal pain   ? Cannabinoid hyperemesis syndrome   ? Chlamydia infection complicating pregnancy in second trimester 08/18/2018  ? Negative test of cure on 08/06/18  ? Transient hypertension of pregnancy 10/12/2019  ? Weight loss 10/12/2019  ? ? ?Past Surgical History:  ?Procedure Laterality Date  ? CESAREAN SECTION N/A 05/14/2020  ? Procedure: CESAREAN SECTION;  Surgeon: Conan Bowens, MD;  Location: [facility] LD ORS;  Service: Obstetrics;  Laterality: N/A;  Primary C/S malpresentation & IUD placement  ? WISDOM TOOTH EXTRACTION    ? ? ?Family History  ?Problem Relation Age of Onset  ? Diabetes Mother   ? Hypertension Mother   ? ? ?Social History  ? ?Tobacco Use  ? Smoking status: Some Days  ?  Packs/day: 0.25  ?  Types: Cigarettes  ?  Last attempt to quit: 01/22/2020  ?  Years since quitting: 1.3  ? Smokeless tobacco: Never  ?Vaping Use  ? Vaping Use: Never used  ?Substance Use Topics  ? Alcohol use: No  ? Drug use: Yes  ?  Types: Marijuana  ?  Comment: 05/06/20  ? ? ?Prior to Admission medications   ?Medication Sig Start Date End Date Taking? Authorizing Provider  ?ferrous sulfate 325 (65 FE) MG tablet Take 1 tablet (325 mg total) by mouth every other day. 05/17/20 07/16/20  Myna Hidalgo, DO   ?potassium chloride (KLOR-CON) 10 MEQ tablet Take 1 tablet (10 mEq total) by mouth daily for 5 days. 01/20/21 01/25/21  Haskel Schroeder, PA-C  ?triamterene-hydrochlorothiazide (DYAZIDE) 37.5-25 MG capsule 1 tab by mouth daily in morning with meal 08/05/20   Julieanne Manson, MD  ?fluticasone (FLONASE) 50 MCG/ACT nasal spray Place 1 spray into both nostrils daily. ?Patient not taking: Reported on 07/03/2019 04/18/19 07/03/19  Henderly, Britni A, PA-C  ? ? ?Allergies ?Patient has no known allergies. ? ? ?REVIEW OF SYSTEMS  ?Negative except as noted here or in the History of Present Illness. ? ? ?PHYSICAL EXAMINATION  ?Initial Vital Signs ?Blood pressure (!) 161/100, pulse 65, temperature 99.7 ?F (37.6 ?C), temperature source Oral, resp. rate 18, height 5\' 6"  (1.676 m), weight (!) 145.2 kg, last menstrual period 03/31/2021, SpO2 100 %, not currently breastfeeding. ? ?Examination ?General: Well-developed, well-nourished female in no acute distress; appearance consistent with age of record ?HENT: normocephalic; swelling, tenderness and laceration of right eyebrow: ? ? ? ?Eyes: pupils equal, round and reactive to light; extraocular muscles intact; no subconjunctival hemorrhage; no hyphema ?Neck: supple ?Heart: regular rate and rhythm ?Lungs: clear to auscultation bilaterally ?Abdomen: soft; nondistended; nontender; bowel sounds present ?Extremities: No deformity; full range of motion; pulses normal ?Neurologic: Awake, alert and oriented; motor function  intact in all extremities and symmetric; no facial droop ?Skin: Warm and dry ?Psychiatric: Normal mood and affect ? ? ?RESULTS  ?Summary of this visit's results, reviewed and interpreted by myself: ? ? EKG Interpretation ? ?Date/Time:    ?Ventricular Rate:    ?PR Interval:    ?QRS Duration:   ?QT Interval:    ?QTC Calculation:   ?R Axis:     ?Text Interpretation:   ?  ? ?  ? ?Laboratory Studies: ?Results for orders placed or performed during the hospital encounter of  05/31/21 (from the past 24 hour(s))  ?Pregnancy, urine     Status: None  ? Collection Time: 05/31/21 11:30 PM  ?Result Value Ref Range  ? Preg Test, Ur NEGATIVE NEGATIVE  ? ?Imaging Studies: ?No results found. ? ?ED COURSE and MDM  ?Nursing notes, initial and subsequent vitals signs, including pulse oximetry, reviewed and interpreted by myself. ? ?Vitals:  ? 05/31/21 2302 05/31/21 2303  ?BP: (!) 161/100   ?Pulse: 65   ?Resp: 18   ?Temp: 99.7 ?F (37.6 ?C)   ?TempSrc: Oral   ?SpO2: 100%   ?Weight:  (!) 145.2 kg  ?Height:  5\' 6"  (1.676 m)  ? ?Medications  ?naproxen (NAPROSYN) tablet 500 mg (500 mg Oral Given 05/31/21 2326)  ? ? ? ? ?PROCEDURES  ?Procedures ?LACERATION REPAIR ?Performed by: Karen Chafe Vihaan Gloss ?Authorized by: Karen Chafe Emmersen Garraway ?Consent: Verbal consent obtained. ?Risks and benefits: risks, benefits and alternatives were discussed ?Consent given by: patient ?Patient identity confirmed: provided demographic data ?Prepped and Draped in normal sterile fashion ?Wound explored ? ?Laceration Location: Right brow ridge ? ?Laceration Length: 1 cm ? ?No Foreign Bodies seen or palpated ? ?Anesthesia: None ? ?Irrigation method: syringe ?Amount of cleaning: standard ? ?Wound closure: Dermabond ? ?Patient tolerance: Patient tolerated the procedure well with no immediate complications. ? ? ?ED DIAGNOSES  ? ?  ICD-10-CM   ?1. Laceration of right eyebrow, initial encounter  S01.111A   ?  ?2. Contusion of face, initial encounter  S00.83XA   ?  ? ? ? ?  ?Shanon Rosser, MD ?05/31/21 2348 ? ?

## 2021-07-15 ENCOUNTER — Encounter (HOSPITAL_COMMUNITY): Payer: Self-pay

## 2021-07-15 ENCOUNTER — Ambulatory Visit (HOSPITAL_COMMUNITY)
Admission: EM | Admit: 2021-07-15 | Discharge: 2021-07-15 | Disposition: A | Discharge status: Home / Self Care | Payer: Medicaid Other | Attending: Family Medicine | Admitting: Family Medicine

## 2021-07-15 DIAGNOSIS — R1084 Generalized abdominal pain: Secondary | ICD-10-CM

## 2021-07-15 DIAGNOSIS — R1110 Vomiting, unspecified: Secondary | ICD-10-CM

## 2021-07-16 ENCOUNTER — Emergency Department (HOSPITAL_COMMUNITY): Payer: Medicaid Other

## 2021-07-16 ENCOUNTER — Other Ambulatory Visit: Payer: Self-pay

## 2021-07-16 ENCOUNTER — Emergency Department (HOSPITAL_COMMUNITY)
Admission: EM | Admit: 2021-07-16 | Discharge: 2021-07-16 | Disposition: A | Discharge status: Home / Self Care | Payer: Medicaid Other | Attending: Emergency Medicine | Admitting: Emergency Medicine

## 2021-07-16 ENCOUNTER — Encounter (HOSPITAL_COMMUNITY): Payer: Self-pay | Admitting: Emergency Medicine

## 2021-07-16 DIAGNOSIS — R1084 Generalized abdominal pain: Secondary | ICD-10-CM | POA: Diagnosis not present

## 2021-07-16 DIAGNOSIS — R197 Diarrhea, unspecified: Secondary | ICD-10-CM | POA: Insufficient documentation

## 2021-07-16 DIAGNOSIS — N9489 Other specified conditions associated with female genital organs and menstrual cycle: Secondary | ICD-10-CM | POA: Insufficient documentation

## 2021-07-16 DIAGNOSIS — R112 Nausea with vomiting, unspecified: Secondary | ICD-10-CM | POA: Insufficient documentation

## 2021-07-16 LAB — CBC WITH DIFFERENTIAL/PLATELET
Abs Immature Granulocytes: 0.05 10*3/uL (ref 0.00–0.07)
Basophils Absolute: 0 10*3/uL (ref 0.0–0.1)
Basophils Relative: 0 %
Eosinophils Absolute: 0 10*3/uL (ref 0.0–0.5)
Eosinophils Relative: 0 %
HCT: 45 % (ref 36.0–46.0)
Hemoglobin: 14.5 g/dL (ref 12.0–15.0)
Immature Granulocytes: 0 %
Lymphocytes Relative: 10 %
Lymphs Abs: 1.4 10*3/uL (ref 0.7–4.0)
MCH: 26.2 pg (ref 26.0–34.0)
MCHC: 32.2 g/dL (ref 30.0–36.0)
MCV: 81.2 fL (ref 80.0–100.0)
Monocytes Absolute: 0.6 10*3/uL (ref 0.1–1.0)
Monocytes Relative: 5 %
Neutro Abs: 11.8 10*3/uL — ABNORMAL HIGH (ref 1.7–7.7)
Neutrophils Relative %: 85 %
Platelets: 250 10*3/uL (ref 150–400)
RBC: 5.54 MIL/uL — ABNORMAL HIGH (ref 3.87–5.11)
RDW: 13.8 % (ref 11.5–15.5)
WBC: 13.9 10*3/uL — ABNORMAL HIGH (ref 4.0–10.5)
nRBC: 0 % (ref 0.0–0.2)

## 2021-07-16 LAB — COMPREHENSIVE METABOLIC PANEL
ALT: 14 U/L (ref 0–44)
AST: 15 U/L (ref 15–41)
Albumin: 4.1 g/dL (ref 3.5–5.0)
Alkaline Phosphatase: 59 U/L (ref 38–126)
Anion gap: 10 (ref 5–15)
BUN: 9 mg/dL (ref 6–20)
CO2: 22 mmol/L (ref 22–32)
Calcium: 9.3 mg/dL (ref 8.9–10.3)
Chloride: 105 mmol/L (ref 98–111)
Creatinine, Ser: 0.81 mg/dL (ref 0.44–1.00)
GFR, Estimated: >60 mL/min (ref >60)
Glucose, Bld: 123 mg/dL — ABNORMAL HIGH (ref 70–99)
Potassium: 3.4 mmol/L — ABNORMAL LOW (ref 3.5–5.1)
Sodium: 137 mmol/L (ref 135–145)
Total Bilirubin: 0.7 mg/dL (ref 0.3–1.2)
Total Protein: 8.4 g/dL — ABNORMAL HIGH (ref 6.5–8.1)

## 2021-07-16 LAB — I-STAT BETA HCG BLOOD, ED (MC, WL, AP ONLY): I-stat hCG, quantitative: <5 m[IU]/mL (ref <5)

## 2021-07-16 LAB — LIPASE, BLOOD: Lipase: 26 U/L (ref 11–51)

## 2021-07-16 MED ORDER — MORPHINE SULFATE (PF) 4 MG/ML IV SOLN
4.0000 mg | Freq: Once | INTRAVENOUS | Status: AC
  Administered 2021-07-16: 4 mg via INTRAVENOUS
  Filled 2021-07-16: qty 1

## 2021-07-16 MED ORDER — SODIUM CHLORIDE (PF) 0.9 % IJ SOLN
INTRAMUSCULAR | Status: AC
  Filled 2021-07-16: qty 50

## 2021-07-16 MED ORDER — DIPHENHYDRAMINE HCL 50 MG/ML IJ SOLN
25.0000 mg | Freq: Once | INTRAMUSCULAR | Status: AC
  Administered 2021-07-16: 25 mg via INTRAVENOUS
  Filled 2021-07-16: qty 1

## 2021-07-16 MED ORDER — PROCHLORPERAZINE EDISYLATE 10 MG/2ML IJ SOLN
10.0000 mg | Freq: Once | INTRAMUSCULAR | Status: AC
  Administered 2021-07-16: 10 mg via INTRAVENOUS
  Filled 2021-07-16: qty 2

## 2021-07-16 MED ORDER — SODIUM CHLORIDE 0.9 % IV BOLUS
1000.0000 mL | Freq: Once | INTRAVENOUS | Status: AC
  Administered 2021-07-16: 1000 mL via INTRAVENOUS

## 2021-07-16 MED ORDER — DROPERIDOL 2.5 MG/ML IJ SOLN
1.2500 mg | Freq: Once | INTRAMUSCULAR | Status: AC
  Administered 2021-07-16: 1.25 mg via INTRAVENOUS
  Filled 2021-07-16: qty 2

## 2021-07-16 MED ORDER — METOCLOPRAMIDE HCL 10 MG PO TABS: 10.0000 mg | ORAL_TABLET | Freq: Four times a day (QID) | ORAL | 0 refills | Status: DC

## 2021-07-16 MED ORDER — IOHEXOL 300 MG/ML  SOLN
100.0000 mL | Freq: Once | INTRAMUSCULAR | Status: AC | PRN
  Administered 2021-07-16: 100 mL via INTRAVENOUS

## 2021-07-16 NOTE — ED Provider Notes (Signed)
Signout from [employer] at shift change. Briefly, patient presents for nausea vomiting.    Plan: Currently awaiting CT imaging, EKG, fluid challenge.   6:43 PM Reassessment performed. Patient appears comfortable.  No vomiting.  Labs and imaging personally reviewed and interpreted including: CBC with elevated white blood cell count at 13.9; CMP with glucose 123, potassium 3.4 otherwise unremarkable; lipase normal.  Pregnancy negative.  CT abdomen pelvis, agree negative.   Reviewed additional pertinent lab work and imaging with patient at bedside including: CT imaging.    Most current vital signs reviewed and are as follows: BP (!) 141/98   Pulse (!) 55   Temp 98.2 F (36.8 C) (Oral)   Resp (!) 24   LMP 06/29/2021 (Exact Date)   SpO2 98%   ED ECG REPORT   Date: 07/16/2021  Rate: 54  Rhythm: sinus bradycardia  QRS Axis: normal  Intervals: normal  ST/T Wave abnormalities: normal  Conduction Disutrbances:none  Narrative Interpretation: incomplete RBBB, normal QTc at 453  Old EKG Reviewed: unchanged form 12/2020  I have personally reviewed the EKG tracing and agree with the computerized printout as noted.  Droperidol, fluid challenge ordered.  Patient has tolerated sips of fluid.  Plan: D/c to home.    Home treatment: Exam, clear liquids with slow advancement of diet.   Return and follow-up instructions: Encouraged return to ED with persistent vomiting, diarrhea, worsening abdominal pain. Encouraged patient to follow-up with their provider in 2 days. Patient verbalized understanding and agreed with plan.    For this patient's complaint of abdominal pain, the following conditions were considered on the differential diagnosis: gastritis/PUD, enteritis/duodenitis, appendicitis, cholelithiasis/cholecystitis, cholangitis, pancreatitis, ruptured viscus, colitis, diverticulitis, small/large bowel obstruction, proctitis, cystitis, pyelonephritis, ureteral colic, aortic dissection, aortic  aneurysm. In women, ectopic pregnancy, pelvic inflammatory disease, ovarian cysts, and tubo-ovarian abscess were also considered. Atypical chest etiologies were also considered including ACS, PE, and pneumonia.   The patient's vital signs, pertinent lab work and imaging were reviewed and interpreted as discussed in the ED course. Hospitalization was considered for further testing, treatments, or serial exams/observation. However as patient is well-appearing, has a stable exam, and reassuring studies today, I do not feel that they warrant admission at this time. This plan was discussed with the patient who verbalizes agreement and comfort with this plan and seems reliable and able to return to the Emergency Department with worsening or changing symptoms.        Renne Crigler, PA-C 07/16/21 1848    Sloan Leiter, DO 07/21/21 1722

## 2021-07-16 NOTE — ED Provider Notes (Signed)
[facility]   CSN: [id number] Arrival date & time: 07/16/21  1138     History  Chief Complaint  Patient presents with   Abdominal Pain    [place] CADYNCE GARRETTE is a 31 y.o. female.  Patient with history of cannabinoid hyperemesis syndrome presents today with complaints of nausea, vomiting, diarrhea, and abdominal pain. She states that her symptoms beqan yesterday and have been worsening since. Originally went to urgent care yesterday and was sent here for labs and imaging. Patient states that she originally went home hoping that her symptoms would improve on their own but states that her symptoms are only getting worse. She states that she has had over 20 episodes of NBNB emesis since yesterday. Was given Zofran without relief. States that her last marijuana use was last week. States that this pain is much worse than her previous bouts of cannabinoid hyperemesis. Pain is located throughout her abdomen. She endorses associated chills but no fevers. No known sick contacts. Last menstrual cycle was at the beginning of June. Denies any dysuria or hematuria. No vaginal discharge. Hx c section in 2022, no other abdominal surgeries.  The history is provided by the patient. No language interpreter was used.  Abdominal Pain Associated symptoms: diarrhea, nausea and vomiting        Home Medications Prior to Admission medications   Medication Sig Start Date End Date Taking? Authorizing Provider  ferrous sulfate 325 (65 FE) MG tablet Take 1 tablet (325 mg total) by mouth every other day. Patient not taking: Reported on 07/16/2021 05/17/20 07/16/20  Myna Hidalgo, DO  potassium chloride (KLOR-CON) 10 MEQ tablet Take 1 tablet (10 mEq total) by mouth daily for 5 days. Patient not taking: Reported on 07/16/2021 01/20/21 01/25/21  Haskel Schroeder, PA-C  triamterene-hydrochlorothiazide (DYAZIDE) 37.5-25 MG capsule 1 tab by mouth daily in morning with  meal Patient not taking: Reported on 07/16/2021 08/05/20   Julieanne Manson, MD  fluticasone [facility]) 50 MCG/ACT nasal spray Place 1 spray into both nostrils daily. Patient not taking: Reported on 07/03/2019 04/18/19 07/03/19  Henderly, Britni A, PA-C      Allergies    Ibuprofen    Review of Systems   Review of Systems  Gastrointestinal:  Positive for abdominal pain, diarrhea, nausea and vomiting.  All other systems reviewed and are negative.   Physical Exam Updated Vital Signs LMP 06/29/2021 (Exact Date)  Physical Exam Vitals and nursing note reviewed.  Constitutional:      General: [name] not in acute distress.    Appearance: Normal appearance. [name] well-developed. [name] obese. [name] not ill-appearing, toxic-appearing or diaphoretic.  HENT:     Head: Normocephalic and atraumatic.  Cardiovascular:     Rate and Rhythm: Normal rate and regular rhythm.  Pulmonary:     Effort: Pulmonary effort is normal. No respiratory distress.     Breath sounds: Normal breath sounds.  Abdominal:     General: Abdomen is flat.     Palpations: Abdomen is soft.     Tenderness: There is generalized abdominal tenderness.  Musculoskeletal:        General: Normal range of motion.     Cervical back: Normal range of motion.  Skin:    General: Skin is warm and dry.  Neurological:     General: No focal deficit present.     Mental Status: [name] alert.  Psychiatric:        Mood and Affect: Mood normal.  Behavior: Behavior normal.    ED Results / Procedures / Treatments   Labs (all labs ordered are listed, but only abnormal results are displayed) Labs Reviewed  COMPREHENSIVE METABOLIC PANEL - Abnormal; Notable for the following components:      Result Value   Potassium 3.4 (*)    Glucose, Bld 123 (*)    Total Protein 8.4 (*)    All other components within normal limits  CBC WITH DIFFERENTIAL/PLATELET - Abnormal; Notable for the following components:   WBC 13.9 (*)    RBC 5.54 (*)     Neutro Abs 11.8 (*)    All other components within normal limits  LIPASE, BLOOD  URINALYSIS, ROUTINE W REFLEX MICROSCOPIC  I-STAT BETA HCG BLOOD, ED ([facility], [facility], [facility] ONLY)    EKG None  Radiology No results found.  Procedures Procedures    Medications Ordered in ED Medications  iohexol (OMNIPAQUE) 300 MG/ML solution 100 mL (has no administration in time range)  prochlorperazine (COMPAZINE) injection 10 mg (has no administration in time range)  diphenhydrAMINE (BENADRYL) injection 25 mg (has no administration in time range)  sodium chloride 0.9 % bolus 1,000 mL (1,000 mLs Intravenous New Bag/Given 07/16/21 1258)  morphine (PF) 4 MG/ML injection 4 mg (4 mg Intravenous Given 07/16/21 1407)    ED Course/ Medical Decision Making/ A&P                           Medical Decision Making Amount and/or Complexity of Data Reviewed Labs: ordered. Radiology: ordered.  Risk Prescription drug management.   This patient presents to the ED for concern of abdominal pain, nausea, vomiting, diarrhea, this involves an extensive number of treatment options, and is a complaint that carries with it a high risk of complications and morbidity.  The differential diagnosis includes cannabinoid hyperemesis, gastroenteritis, other acute intra-abdominal process. This is not an exhaustive differential   Co morbidities that complicate the patient evaluation  Hx marijuana use   Additional history obtained:  Additional history obtained from urgent care note   Lab Tests:  I Ordered, and personally interpreted labs.  The pertinent results include:  K 3.4, glucose 123, WBC 13.9   Imaging Studies ordered:  I ordered imaging studies including Ct abdomen pelvis with contrast  Imaging pending at shift change   Problem List / ED Course / Critical interventions / Medication management  I ordered medication including morphine, compazine, benadryl for pain and nausea/vomiting and fluids for dehydration   Reevaluation of the patient after these medicines showed that the patient improved I have reviewed the patients home medicines and have made adjustments as needed   Test / Admission - Considered:  Patient presents today with abdominal pain, nausea, and vomiting. [name] afebrile, non-toxic appearing, and in no acute distress with reassuring vital signs. Patient with history of cannabinoid hyperemesis, states that her pain is worse than normal. Upon initial evaluation, patient was rolling around in bed in obvious discomfort, therefore CT imaging ordered further evaluation. This is pending at shift change. Also plan to give droperidol, however EKG to determine QTc is pending as well. After imaging results, patient will need reassessment.  Care handoff to [facility], PA-C at shift change. Please see their note for further evaluation and dispo.    Final Clinical Impression(s) / ED Diagnoses Final diagnoses:  None    Rx / DC Orders ED Discharge Orders     None  Vear Clock 07/18/21 1934    Linwood Dibbles, MD 07/20/21 [phone]

## 2021-08-16 IMAGING — US US ABDOMEN LIMITED RUQ/ASCITES
1 series · 16 of 25 positions shown · non-contrast
Comparison: None.

CLINICAL DATA: 29-year-old female with right upper quadrant
abdominal pain

EXAM:
ULTRASOUND ABDOMEN LIMITED RIGHT UPPER QUADRANT

[Series 1: us abdomen limited ruq/ascites · 65 acquisitions, 16 frames shown]
[im 1/65]
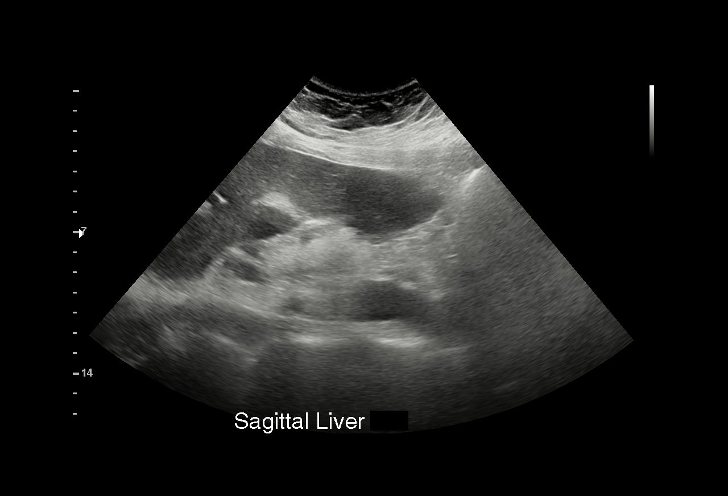
[im 6/65]
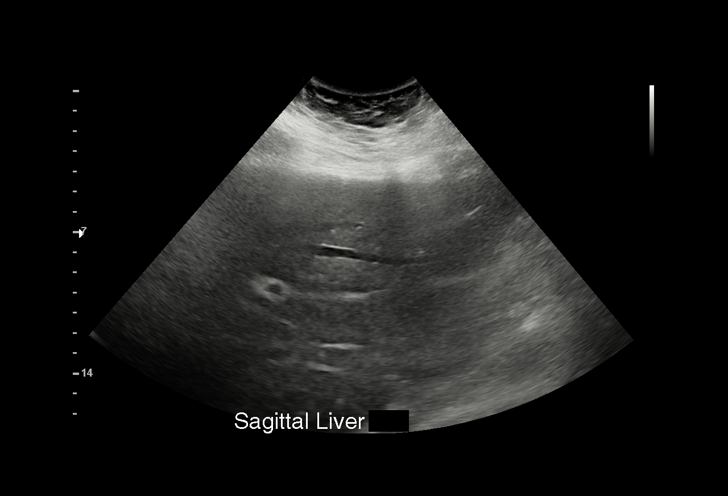
[im 9/65]
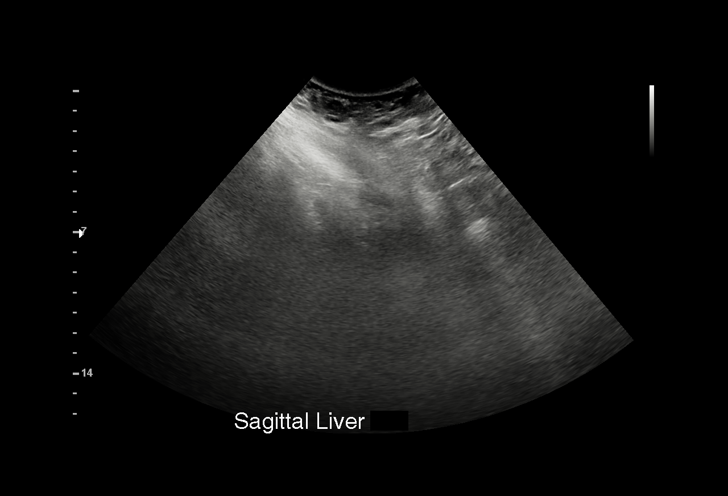
[im 14/65]
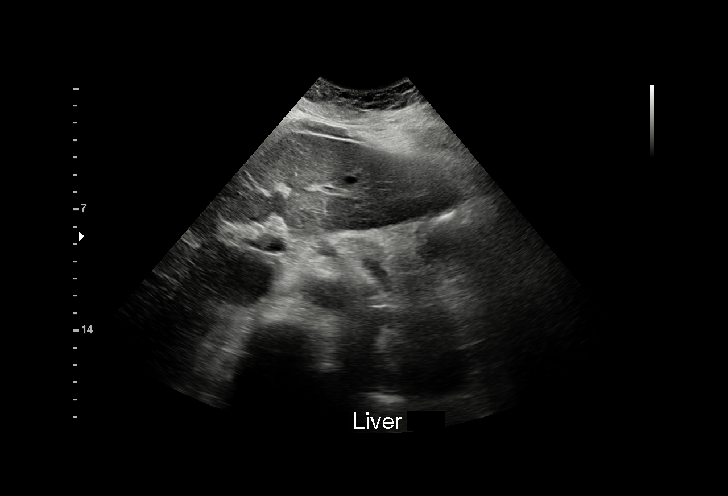
[im 19/65]
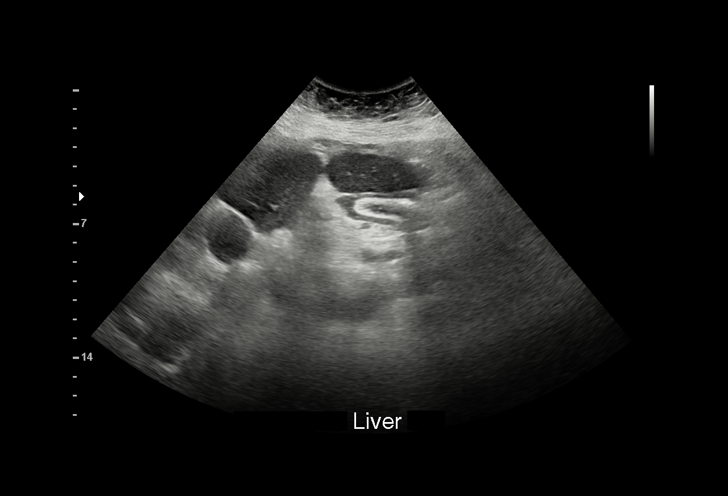
[im 22/65]
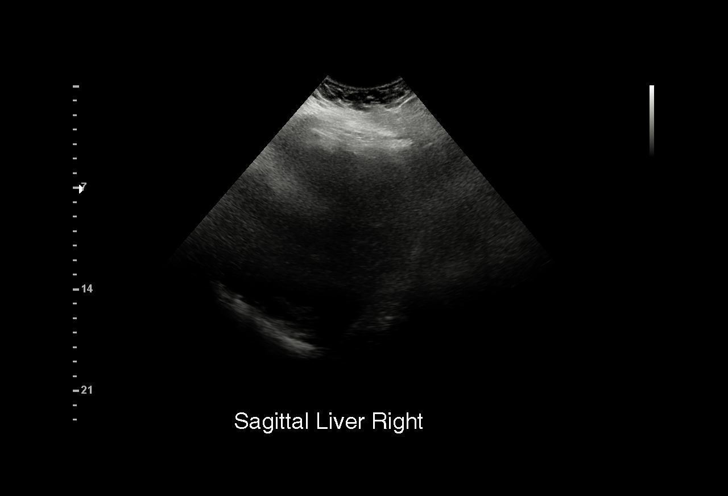
[im 27/65]
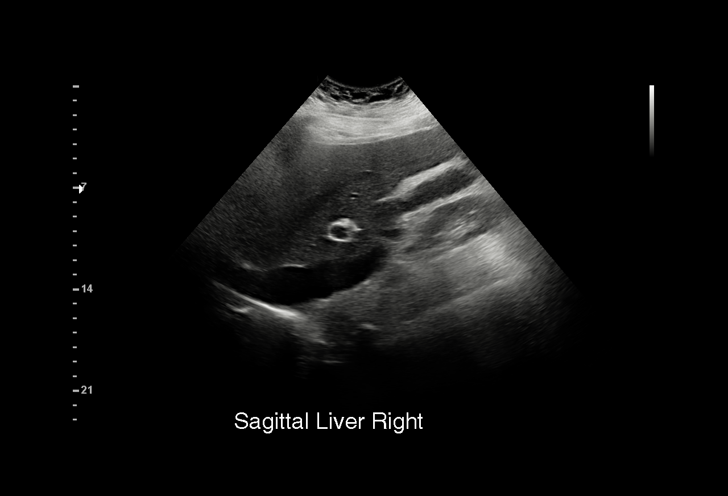
[im 30/65]
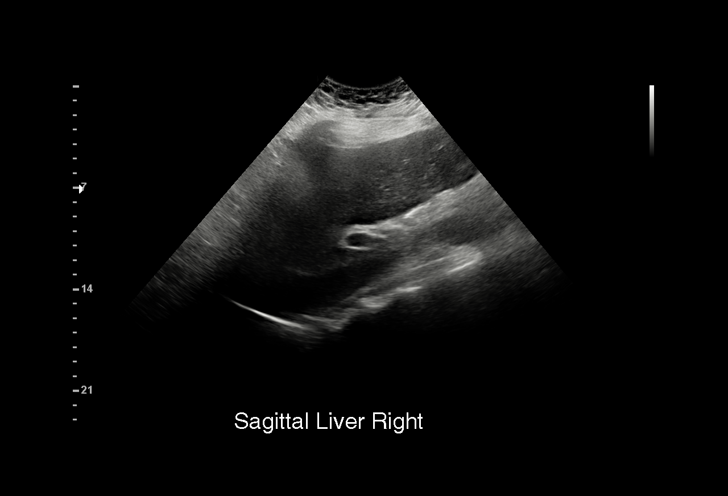
[im 35/65]
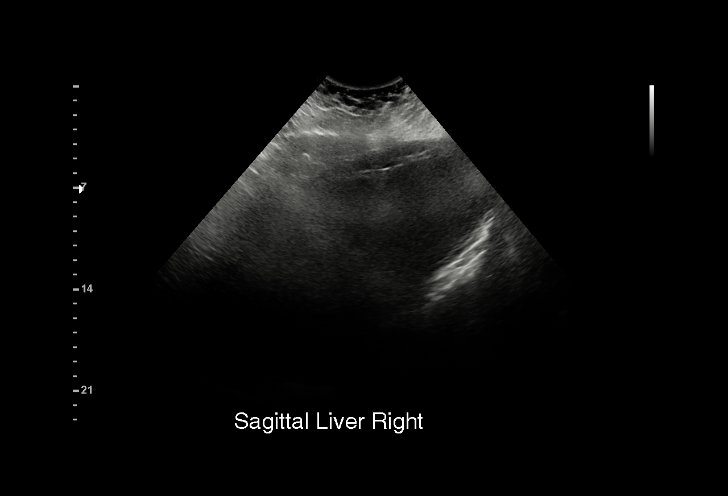
[im 38/65]
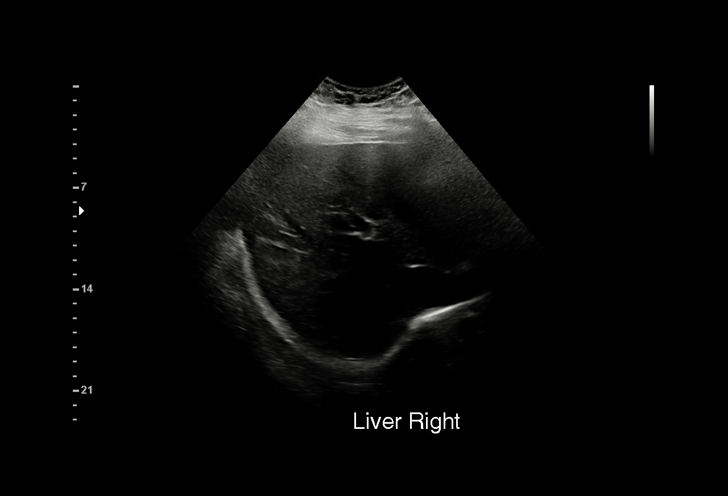
[im 43/65]
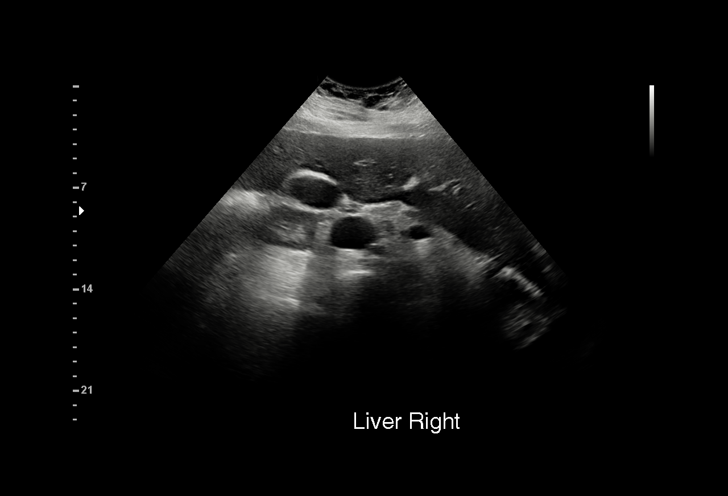
[im 46/65]
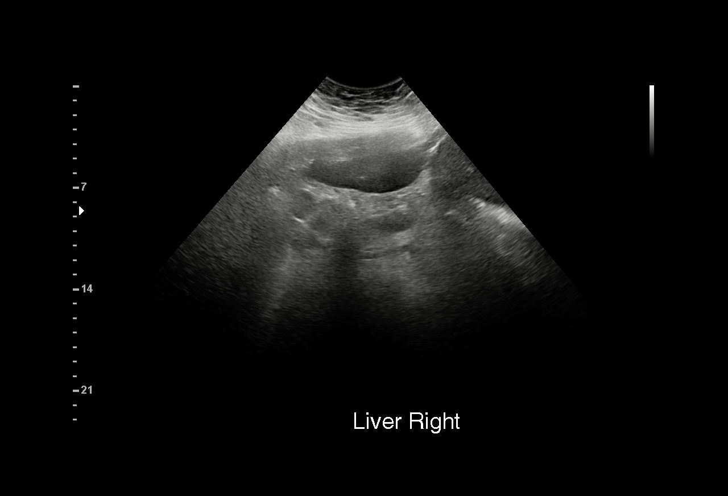
[im 51/65]
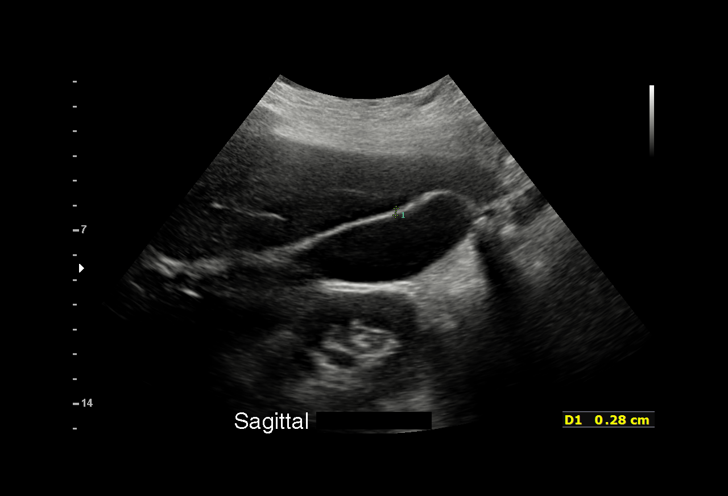
[im 57/65]
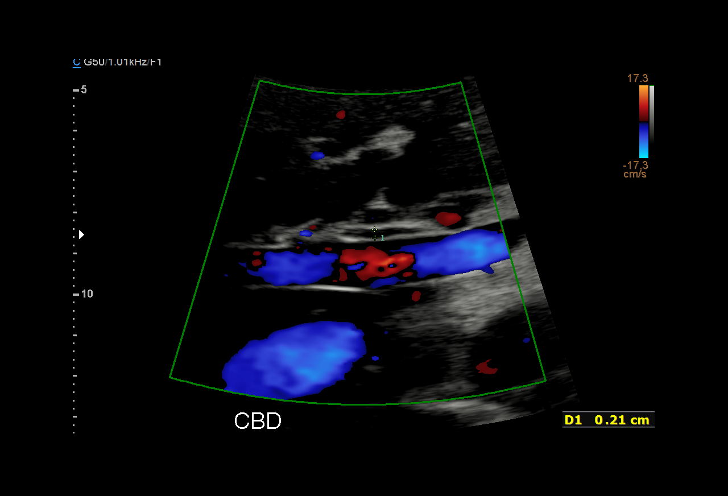
[im 59/65]
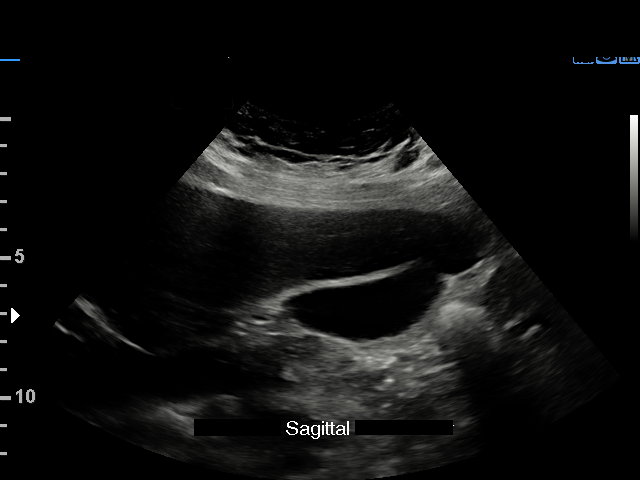
[im 65/65]
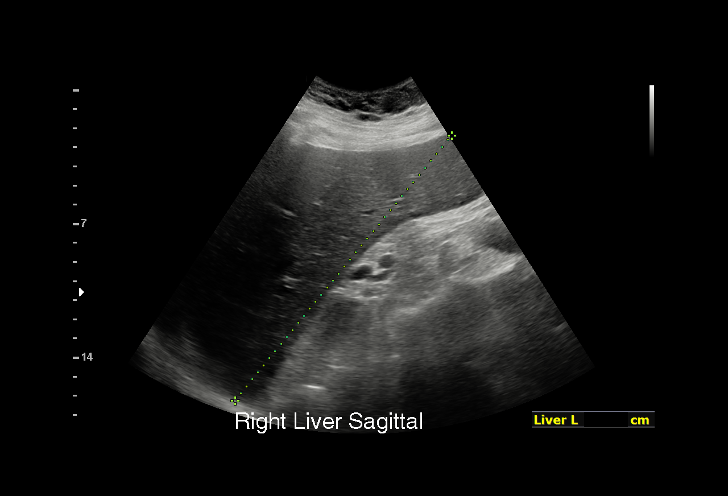

[16 of 25 positions shown; findings below may reference images not displayed]

FINDINGS: Gallbladder:

No gallstones or wall thickening visualized. No sonographic Murphy
sign noted by sonographer.

Common bile duct:

Diameter: 2 mm.

Liver:

No focal lesion identified. Within normal limits in parenchymal
echogenicity. Portal vein is patent on color Doppler imaging with
normal direction of blood flow towards the liver.

Other: None.
IMPRESSION: Unremarkable right upper quadrant ultrasound.

## 2021-08-27 ENCOUNTER — Ambulatory Visit (HOSPITAL_COMMUNITY)
Admission: EM | Admit: 2021-08-27 | Discharge: 2021-08-27 | Disposition: A | Discharge status: Home / Self Care | Payer: Medicaid Other | Attending: Nurse Practitioner | Admitting: Nurse Practitioner

## 2021-08-27 DIAGNOSIS — Z79899 Other long term (current) drug therapy: Secondary | ICD-10-CM | POA: Insufficient documentation

## 2021-08-27 DIAGNOSIS — F331 Major depressive disorder, recurrent, moderate: Secondary | ICD-10-CM

## 2021-08-27 DIAGNOSIS — F319 Bipolar disorder, unspecified: Secondary | ICD-10-CM | POA: Insufficient documentation

## 2021-08-27 DIAGNOSIS — F411 Generalized anxiety disorder: Secondary | ICD-10-CM | POA: Insufficient documentation

## 2021-08-27 MED ORDER — BUPROPION HCL ER (SR) 100 MG PO TB12: 100.0000 mg | ORAL_TABLET | Freq: Every day | ORAL | 0 refills | Status: DC

## 2021-08-27 MED ORDER — BUPROPION HCL ER (SR) 150 MG PO TB12: 150.0000 mg | ORAL_TABLET | Freq: Every day | ORAL | 0 refills | Status: DC

## 2021-08-27 NOTE — Discharge Instructions (Addendum)

## 2021-08-27 NOTE — BH Assessment (Signed)
Pt presenting with worsening depression and increased anxiety. Pt denies SI, HI, AVH and substance use. Pt reports having several stressors including work , home and bills. Pt mother passed 10 years ago she was in jail for 9 years and was released 3 yrs ago. Pt going to mom grave sight which is triggering. Pt also reports ability to talk with spirits. Pt reports racing thoughts and she wants help with calming her nerves.
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
## 2021-08-27 NOTE — ED Notes (Signed)
AVS given to patient - walked out of building with no sxs of distress noted - belongings returned

## 2021-08-27 NOTE — ED Notes (Signed)
Work note and prescriptions given to patient

## 2021-09-03 ENCOUNTER — Emergency Department (HOSPITAL_BASED_OUTPATIENT_CLINIC_OR_DEPARTMENT_OTHER)
Admission: EM | Admit: 2021-09-03 | Discharge: 2021-09-03 | Disposition: A | Discharge status: Home / Self Care | Payer: Medicaid Other | Attending: Emergency Medicine | Admitting: Emergency Medicine

## 2021-09-03 ENCOUNTER — Encounter (HOSPITAL_BASED_OUTPATIENT_CLINIC_OR_DEPARTMENT_OTHER): Payer: Self-pay | Admitting: Emergency Medicine

## 2021-09-03 ENCOUNTER — Other Ambulatory Visit: Payer: Self-pay

## 2021-09-03 DIAGNOSIS — S0181XA Laceration without foreign body of other part of head, initial encounter: Secondary | ICD-10-CM | POA: Diagnosis not present

## 2021-09-03 DIAGNOSIS — S0990XA Unspecified injury of head, initial encounter: Secondary | ICD-10-CM | POA: Diagnosis present

## 2021-09-03 DIAGNOSIS — Z23 Encounter for immunization: Secondary | ICD-10-CM | POA: Insufficient documentation

## 2021-09-03 MED ORDER — AMOXICILLIN-POT CLAVULANATE 875-125 MG PO TABS: 1.0000 | ORAL_TABLET | Freq: Two times a day (BID) | ORAL | 0 refills | Status: DC

## 2021-09-03 MED ORDER — TETANUS-DIPHTH-ACELL PERTUSSIS 5-2.5-18.5 LF-MCG/0.5 IM SUSY
0.5000 mL | PREFILLED_SYRINGE | Freq: Once | INTRAMUSCULAR | Status: AC
  Administered 2021-09-03: 0.5 mL via INTRAMUSCULAR
  Filled 2021-09-03: qty 0.5

## 2021-09-03 MED ORDER — NAPROXEN 500 MG PO TABS: 500.0000 mg | ORAL_TABLET | Freq: Two times a day (BID) | ORAL | 0 refills | Status: DC

## 2021-09-03 MED ORDER — NAPROXEN 250 MG PO TABS
500.0000 mg | ORAL_TABLET | Freq: Once | ORAL | Status: AC
  Administered 2021-09-03: 500 mg via ORAL
  Filled 2021-09-03: qty 2
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
## 2021-09-06 ENCOUNTER — Telehealth (HOSPITAL_COMMUNITY): Payer: Self-pay

## 2021-09-06 NOTE — BH Assessment (Signed)
Care Management - [facility] Follow Up Discharges   Writer attempted to make contact with patient today and was unsuccessful.  Voicemail is not set up.    Per chart review, patient's guardian will follow up with established provider OB/GYN that prescribes medication for depression.

## 2021-09-19 ENCOUNTER — Encounter (HOSPITAL_COMMUNITY): Payer: Self-pay

## 2021-09-19 ENCOUNTER — Ambulatory Visit (HOSPITAL_COMMUNITY)
Admission: EM | Admit: 2021-09-19 | Discharge: 2021-09-19 | Disposition: A | Discharge status: Home / Self Care | Payer: Medicaid Other | Attending: Internal Medicine | Admitting: Internal Medicine

## 2021-09-19 DIAGNOSIS — R350 Frequency of micturition: Secondary | ICD-10-CM | POA: Diagnosis present

## 2021-09-19 DIAGNOSIS — Z3202 Encounter for pregnancy test, result negative: Secondary | ICD-10-CM | POA: Insufficient documentation

## 2021-09-19 DIAGNOSIS — Z113 Encounter for screening for infections with a predominantly sexual mode of transmission: Secondary | ICD-10-CM | POA: Insufficient documentation

## 2021-09-19 LAB — POCT URINALYSIS DIPSTICK, ED / UC
Bilirubin Urine: NEGATIVE
Glucose, UA: NEGATIVE mg/dL
Hgb urine dipstick: NEGATIVE
Ketones, ur: NEGATIVE mg/dL
Leukocytes,Ua: NEGATIVE
Nitrite: NEGATIVE
Protein, ur: NEGATIVE mg/dL
Specific Gravity, Urine: 1.02 (ref 1.005–1.030)
Urobilinogen, UA: 0.2 mg/dL (ref 0.0–1.0)
pH: 6.5 (ref 5.0–8.0)

## 2021-09-19 LAB — POC URINE PREG, ED: Preg Test, Ur: NEGATIVE

## 2021-09-19 LAB — HIV ANTIBODY (ROUTINE TESTING W REFLEX): HIV Screen 4th Generation wRfx: NONREACTIVE

## 2021-09-19 NOTE — ED Provider Notes (Signed)
Patient presents urgent care for evaluation of [facility]    CSN: [id number] Arrival date & time: 09/19/21  1437      History   Chief Complaint No chief complaint on file.   HPI Jacqueline Shepherd is a 31 y.o. female.   Patient presents to urgent care for evaluation of vaginal irritation for the past couple of days and states "my coochie doesn't feel right".  Unknown exposure to STI.  [name] in a monogamous relationship with 1 female partner.  Denies recent new sexual partners.  Denies vaginal discharge, odor, rash, and irritation.  States she has been experiencing urinary frequency over the last few days but denies dysuria, urinary urgency, fever/chills, abdominal pain, nausea, vomiting, diarrhea, dizziness, and back pain.  Denies recent antibiotic use.  [name] requesting HIV and syphilis testing today as well.  She has not attempted use of any over-the-counter medications prior to arrival urgent care for her urinary frequency.  Requesting pregnancy test today as well.     Past Medical History:  Diagnosis Date   Abdominal pain    Cannabinoid hyperemesis syndrome    Chlamydia infection complicating pregnancy in second trimester 08/18/2018   Negative test of cure on 08/06/18   Transient hypertension of pregnancy 10/12/2019   Weight loss 10/12/2019    Patient Active Problem List   Diagnosis Date Noted   Cannabis hyperemesis syndrome concurrent with and due to cannabis abuse (HCC) 06/04/2020   IUD (intrauterine device) in place 05/15/2020   Fetal malpresentation 05/14/2020   Preeclampsia 05/10/2020   Antepartum mild preeclampsia 05/07/2020   Carpal tunnel syndrome during pregnancy 04/15/2020   Gestational diabetes mellitus (GDM), antepartum 04/13/2020   Dysuria during pregnancy in second trimester 02/23/2020   Abnormal TSH 12/09/2019   Hyperemesis gravidarum, antepartum 11/22/2019   Tobacco use in pregnancy, antepartum, first trimester 11/11/2019   Other social stressor  11/11/2019   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   Obesity in pregnancy 10/12/2019   BMI 40.0-44.9, adult (HCC) 10/12/2019   Cannabinoid hyperemesis syndrome    Chlamydia infection affecting pregnancy in second trimester 08/18/2018   Rubella non-immune status, antepartum 08/18/2018    Past Surgical History:  Procedure Laterality Date   CESAREAN SECTION N/A 05/14/2020   Procedure: CESAREAN SECTION;  Surgeon: Conan Bowens, MD;  Location: [facility];  Service: Obstetrics;  Laterality: N/A;  Primary C/S malpresentation & IUD placement   WISDOM TOOTH EXTRACTION      OB History     Gravida  2   Para  1   Term  1   Preterm      AB  1   Living  1      SAB  1   IAB      Ectopic      Multiple  0   Live Births  1            Home Medications    Prior to Admission medications   Medication Sig Start Date End Date Taking? Authorizing Provider  amoxicillin-clavulanate (AUGMENTIN) 875-125 MG tablet Take 1 tablet by mouth every 12 (twelve) hours. 09/03/21   Roemhildt, Lorin T, PA-C  buPROPion (WELLBUTRIN SR) 150 MG 12 hr tablet Take 1 tablet (150 mg total) by mouth at bedtime. 08/27/21 08/27/22  Bobbitt, Franchot Mimes, NP  buPROPion ER (WELLBUTRIN SR) 100 MG 12 hr tablet Take 1 tablet (100 mg total) by mouth daily. 08/27/21 08/27/22  Bobbitt, Franchot Mimes, NP  naproxen (NAPROSYN) 500 MG  tablet Take 1 tablet (500 mg total) by mouth 2 (two) times daily. 09/03/21   Roemhildt, Lorin T, PA-C  fluticasone (FLONASE) 50 MCG/ACT nasal spray Place 1 spray into both nostrils daily. Patient not taking: Reported on 07/03/2019 04/18/19 07/03/19  Henderly, Britni A, PA-C    Family History Family History  Problem Relation Age of Onset   Diabetes Mother    Hypertension Mother     Social History Social History   Tobacco Use   Smoking status: Some Days    Packs/day: 0.25    Types: Cigarettes    Last attempt to quit: 01/22/2020    Years since quitting: 1.6   Smokeless tobacco: Never   Vaping Use   Vaping Use: Never used  Substance Use Topics   Alcohol use: No   Drug use: Yes    Types: Marijuana    Comment: 05/06/20     Allergies   Ibuprofen   Review of Systems Review of Systems Per HPI  Physical Exam Triage Vital Signs ED Triage Vitals  Enc Vitals Group     BP 09/19/21 1542 124/85     Pulse Rate 09/19/21 1542 67     Resp 09/19/21 1542 16     Temp 09/19/21 1542 98 F (36.7 C)     Temp Source 09/19/21 1542 Oral     SpO2 09/19/21 1542 100 %     Weight 09/19/21 1540 300 lb (136.1 kg)     Height 09/19/21 1540 5\' 7"  (1.702 m)     Head Circumference --      Peak Flow --      Pain Score 09/19/21 1540 0     Pain Loc --      Pain Edu? --      Excl. in GC? --    No data found.  Updated Vital Signs BP 124/85 (BP Location: Left Arm)   Pulse 67   Temp 98 F (36.7 C) (Oral)   Resp 16   Ht 5\' 7"  (1.702 m)   Wt 300 lb (136.1 kg)   LMP 09/16/2021   SpO2 100%   BMI 46.99 kg/m   Visual Acuity Right Eye Distance:   Left Eye Distance:   Bilateral Distance:    Right Eye Near:   Left Eye Near:    Bilateral Near:     Physical Exam Vitals and nursing note reviewed.  Constitutional:      Appearance: Normal appearance. [name] not ill-appearing or toxic-appearing.     Comments: Very pleasant patient sitting on exam in position of comfort table in no acute distress.   HENT:     Head: Normocephalic and atraumatic.     Right Ear: Hearing and external ear normal.     Left Ear: Hearing and external ear normal.     Nose: Nose normal.     Mouth/Throat:     Lips: Pink.     Mouth: Mucous membranes are moist.  Eyes:     General: Lids are normal. Vision grossly intact. Gaze aligned appropriately.     Extraocular Movements: Extraocular movements intact.     Conjunctiva/sclera: Conjunctivae normal.  Pulmonary:     Effort: Pulmonary effort is normal.  Genitourinary:    Comments: Deferred. Musculoskeletal:     Cervical back: Neck supple.  Skin:     General: Skin is warm and dry.     Capillary Refill: Capillary refill takes less than 2 seconds.     Findings: No rash.  Neurological:  General: No focal deficit present.     Mental Status: [name] alert and oriented to person, place, and time. Mental status is at baseline.     Cranial Nerves: No dysarthria or facial asymmetry.     Gait: Gait is intact.  Psychiatric:        Mood and Affect: Mood normal.        Speech: Speech normal.        Behavior: Behavior normal.        Thought Content: Thought content normal.        Judgment: Judgment normal.      UC Treatments / Results  Labs (all labs ordered are listed, but only abnormal results are displayed) Labs Reviewed  HIV ANTIBODY (ROUTINE TESTING W REFLEX)  RPR  POCT URINALYSIS DIPSTICK, ED / UC  POC URINE PREG, ED  CERVICOVAGINAL ANCILLARY ONLY    EKG   Radiology No results found.  Procedures Procedures (including critical care time)  Medications Ordered in UC Medications - No data to display  Initial Impression / Assessment and Plan / UC Course  I have reviewed the triage vital signs and the nursing notes.  Pertinent labs & imaging results that were available during my care of the patient were reviewed by me and considered in my medical decision making (see chart for details).   1.  Screen for STD STI labs pending.  Patient requests HIV and syphilis testing today.  Will notify patient of positive results and treat accordingly when labs come back.  Patient to avoid sexual intercourse until screening testing comes back.  Education provided regarding safe sexual practices and patient encouraged to use protection to prevent spread of STIs.   2.  Urinary frequency Urinalysis negative for urinary tract infection.  Urine pregnancy test negative.  Patient to avoid intake of urinary irritants and increase water intake to at least 8 cups of water per day to stay well hydrated.  Discussed physical exam and available lab  work findings in clinic with patient.  Counseled patient regarding appropriate use of medications and potential side effects for all medications recommended or prescribed today. Discussed red flag signs and symptoms of worsening condition,when to call the PCP office, return to urgent care, and when to seek higher level of care in the emergency department. Patient verbalizes understanding and agreement with plan. All questions answered. Patient discharged in stable condition.  Final Clinical Impressions(s) / UC Diagnoses   Final diagnoses:  Screen for STD (sexually transmitted disease)  Negative pregnancy test  Urinary frequency     Discharge Instructions      Your STD testing has been sent to the lab and will come back in the next 2 to 3 days.  We will call you if any of your results are positive requiring treatment and treat you at that time.   Avoid sexual intercourse until your STD results come back.  If any of your STD results are positive, you will need to avoid sexual intercourse for 7 days while you are being treated to prevent spread of STD.  Condom use is the best way to prevent spread of STDs.  Urine is negative for infection and pregnancy.   Return to urgent care as needed.      ED Prescriptions   None    PDMP not reviewed this encounter.   Carlisle Beers, [place] 09/19/21 1644

## 2021-09-19 NOTE — ED Triage Notes (Signed)
Std check as a prevention. Pt states she is not sure what her partner is out there doing. Pt states she is not having any symptoms at this time

## 2021-09-19 NOTE — Discharge Instructions (Addendum)
Your STD testing has been sent to the lab and will come back in the next 2 to 3 days.  We will call you if any of your results are positive requiring treatment and treat you at that time.   Avoid sexual intercourse until your STD results come back.  If any of your STD results are positive, you will need to avoid sexual intercourse for 7 days while you are being treated to prevent spread of STD.  Condom use is the best way to prevent spread of STDs.  Urine is negative for infection and pregnancy.   Return to urgent care as needed.

## 2021-09-20 ENCOUNTER — Telehealth (HOSPITAL_COMMUNITY): Payer: Self-pay | Admitting: Emergency Medicine

## 2021-09-20 LAB — CERVICOVAGINAL ANCILLARY ONLY
Bacterial Vaginitis (gardnerella): POSITIVE — AB
Candida Glabrata: NEGATIVE
Candida Vaginitis: POSITIVE — AB
Chlamydia: NEGATIVE
Comment: NEGATIVE
Comment: NEGATIVE
Comment: NEGATIVE
Comment: NEGATIVE
Comment: NEGATIVE
Comment: NORMAL
Neisseria Gonorrhea: NEGATIVE
Trichomonas: NEGATIVE

## 2021-09-20 LAB — RPR: RPR Ser Ql: NONREACTIVE

## 2021-09-20 MED ORDER — FLUCONAZOLE 150 MG PO TABS: 150.0000 mg | ORAL_TABLET | Freq: Once | ORAL | 0 refills | Status: AC

## 2021-09-20 MED ORDER — METRONIDAZOLE 500 MG PO TABS: 500.0000 mg | ORAL_TABLET | Freq: Two times a day (BID) | ORAL | 0 refills | Status: DC

## 2021-10-16 ENCOUNTER — Encounter (HOSPITAL_COMMUNITY): Payer: Self-pay

## 2021-10-16 ENCOUNTER — Ambulatory Visit (HOSPITAL_COMMUNITY)
Admission: EM | Admit: 2021-10-16 | Discharge: 2021-10-16 | Disposition: A | Discharge status: Home / Self Care | Payer: Medicaid Other | Attending: Physician Assistant | Admitting: Physician Assistant

## 2021-10-16 DIAGNOSIS — S61211A Laceration without foreign body of left index finger without damage to nail, initial encounter: Secondary | ICD-10-CM | POA: Diagnosis not present

## 2021-10-16 MED ORDER — ACETAMINOPHEN 325 MG PO TABS
ORAL_TABLET | ORAL | Status: AC
  Filled 2021-10-16: qty 3

## 2021-10-16 MED ORDER — LIDOCAINE HCL (PF) 1 % IJ SOLN
INTRAMUSCULAR | Status: AC
  Filled 2021-10-16: qty 4

## 2021-10-16 MED ORDER — ACETAMINOPHEN 325 MG PO TABS
975.0000 mg | ORAL_TABLET | Freq: Once | ORAL | Status: AC
  Administered 2021-10-16: 975 mg via ORAL

## 2021-10-16 NOTE — Discharge Instructions (Signed)
Use the splint to provide protection.  Keep the area clean with soap and water.  If you have any drainage or bleeding you need to be seen immediately.  If you have numbness or decreased range of motion of the finger you should follow-up with hand specialist.  Please call to schedule appointment.  If everything is healing normally you can return here in 10 to 14 days to have sutures removed.

## 2021-10-16 NOTE — ED Triage Notes (Signed)
Pt cut finger on left hand while preparing breakfast causing pain and discomfort.

## 2021-10-16 NOTE — ED Provider Notes (Signed)
[facility]    CSN: [id number] Arrival date & time: 10/16/21  1039      History   Chief Complaint Chief Complaint  Patient presents with   Laceration    HPI Jacqueline Shepherd is a 31 y.o. female.   Patient resents today with a several hour history of laceration to her left index finger.  Reports that she was cutting bacon when it went through the wrapping and into her finger.  [name] right-handed.  Denies any numbness or paresthesias.  She has not cleaned it with anything and presented immediately to the clinic.    Past Medical History:  Diagnosis Date   Abdominal pain    Cannabinoid hyperemesis syndrome    Chlamydia infection complicating pregnancy in second trimester 08/18/2018   Negative test of cure on 08/06/18   Transient hypertension of pregnancy 10/12/2019   Weight loss 10/12/2019    Patient Active Problem List   Diagnosis Date Noted   Cannabis hyperemesis syndrome concurrent with and due to cannabis abuse (HCC) 06/04/2020   IUD (intrauterine device) in place 05/15/2020   Fetal malpresentation 05/14/2020   Preeclampsia 05/10/2020   Antepartum mild preeclampsia 05/07/2020   Carpal tunnel syndrome during pregnancy 04/15/2020   Gestational diabetes mellitus (GDM), antepartum 04/13/2020   Dysuria during pregnancy in second trimester 02/23/2020   Abnormal TSH 12/09/2019   Hyperemesis gravidarum, antepartum 11/22/2019   Tobacco use in pregnancy, antepartum, first trimester 11/11/2019   Other social stressor 11/11/2019   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   Obesity in pregnancy 10/12/2019   BMI 40.0-44.9, adult (HCC) 10/12/2019   Cannabinoid hyperemesis syndrome    Chlamydia infection affecting pregnancy in second trimester 08/18/2018   Rubella non-immune status, antepartum 08/18/2018    Past Surgical History:  Procedure Laterality Date   CESAREAN SECTION N/A 05/14/2020   Procedure: CESAREAN SECTION;  Surgeon: Conan Bowens, MD;   Location: [facility];  Service: Obstetrics;  Laterality: N/A;  Primary C/S malpresentation & IUD placement   WISDOM TOOTH EXTRACTION      OB History     Gravida  2   Para  1   Term  1   Preterm      AB  1   Living  1      SAB  1   IAB      Ectopic      Multiple  0   Live Births  1            Home Medications    Prior to Admission medications   Medication Sig Start Date End Date Taking? Authorizing Provider  buPROPion (WELLBUTRIN SR) 150 MG 12 hr tablet Take 1 tablet (150 mg total) by mouth at bedtime. 08/27/21 08/27/22  Bobbitt, Franchot Mimes, NP  buPROPion ER (WELLBUTRIN SR) 100 MG 12 hr tablet Take 1 tablet (100 mg total) by mouth daily. 08/27/21 08/27/22  Bobbitt, Franchot Mimes, NP  metroNIDAZOLE (FLAGYL) 500 MG tablet Take 1 tablet (500 mg total) by mouth 2 (two) times daily. 09/20/21   Lamptey, Britta Mccreedy, MD  naproxen (NAPROSYN) 500 MG tablet Take 1 tablet (500 mg total) by mouth 2 (two) times daily. 09/03/21   Roemhildt, Lorin T, PA-C  fluticasone (FLONASE) 50 MCG/ACT nasal spray Place 1 spray into both nostrils daily. Patient not taking: Reported on 07/03/2019 04/18/19 07/03/19  Henderly, Britni A, PA-C    Family History Family History  Problem Relation Age of Onset   Diabetes Mother  Hypertension Mother     Social History Social History   Tobacco Use   Smoking status: Some Days    Packs/day: 0.25    Types: Cigarettes    Last attempt to quit: 01/22/2020    Years since quitting: 1.7   Smokeless tobacco: Never  Vaping Use   Vaping Use: Never used  Substance Use Topics   Alcohol use: No   Drug use: Yes    Types: Marijuana    Comment: 05/06/20     Allergies   Ibuprofen   Review of Systems Review of Systems  Constitutional:  Positive for activity change. Negative for appetite change, fatigue and fever.  Musculoskeletal:  Negative for arthralgias and myalgias.  Skin:  Positive for wound. Negative for color change.  Neurological:  Negative for weakness and  numbness.     Physical Exam Triage Vital Signs ED Triage Vitals  Enc Vitals Group     BP 10/16/21 1045 (!) 160/124     Pulse Rate 10/16/21 1045 (!) 101     Resp 10/16/21 1045 12     Temp 10/16/21 1045 98.3 F (36.8 C)     Temp Source 10/16/21 1045 Oral     SpO2 10/16/21 1045 98 %     Weight 10/16/21 1046 (!) 330 lb (149.7 kg)     Height --      Head Circumference --      Peak Flow --      Pain Score 10/16/21 1046 10     Pain Loc --      Pain Edu? --      Excl. in GC? --    No data found.  Updated Vital Signs BP (!) 160/124 (BP Location: Left Arm) Comment: Simultaneous filing. User may not have seen previous data. Comment (BP Location): Simultaneous filing. User may not have seen previous data.  Pulse (!) 101   Temp 98.3 F (36.8 C) (Oral)   Resp 12   Wt (!) 330 lb (149.7 kg)   LMP 09/16/2021   SpO2 98% Comment: Simultaneous filing. User may not have seen previous data.  BMI 51.69 kg/m   Visual Acuity Right Eye Distance:   Left Eye Distance:   Bilateral Distance:    Right Eye Near:   Left Eye Near:    Bilateral Near:     Physical Exam Vitals reviewed.  Constitutional:      General: [name] awake. [name] not in acute distress.    Appearance: Normal appearance. [name] well-developed. [name] not ill-appearing.     Comments: Very pleasant female appears stated age crying hysterically but in no acute distress  HENT:     Head: Normocephalic and atraumatic.  Cardiovascular:     Heart sounds: No murmur heard.    Comments: Capillary refill less than 2 seconds distal left index finger Pulmonary:     Effort: Pulmonary effort is normal.     Breath sounds: Normal breath sounds. No wheezing, rhonchi or rales.     Comments: Clear to auscultation bilaterally Musculoskeletal:     Left hand: Laceration present.     Comments: Full active range of motion of left finger.  Skin:    Findings: Laceration present.     Comments: 5 cm laceration over left index finger pad  curving toward nail bed with no nailbed involvement.  Psychiatric:        Behavior: Behavior is cooperative.      UC Treatments / Results  Labs (all labs ordered  are listed, but only abnormal results are displayed) Labs Reviewed - No data to display  EKG   Radiology No results found.  Procedures Laceration Repair  Date/Time: 10/16/2021 12:02 PM  Performed by: Jeani Hawking, PA-C Authorized by: Jeani Hawking, PA-C   Consent:    Consent obtained:  Verbal   Consent given by:  Patient   Risks discussed:  Poor cosmetic result, poor wound healing, infection and pain   Alternatives discussed:  Referral Universal protocol:    Procedure explained and questions answered to patient or proxy's satisfaction: yes     Patient identity confirmed:  Verbally with patient Anesthesia:    Anesthesia method:  Local infiltration   Local anesthetic:  Lidocaine 1% w/o epi Laceration details:    Location:  Finger   Finger location:  L index finger   Length (cm):  5   Depth (mm):  2 Pre-procedure details:    Preparation:  Patient was prepped and draped in usual sterile fashion Exploration:    Limited defect created (wound extended): no     Hemostasis achieved with:  Direct pressure and tourniquet   Imaging outcome: foreign body not noted     Wound exploration: entire depth of wound visualized     Contaminated: no   Treatment:    Area cleansed with:  Chlorhexidine   Amount of cleaning:  Standard   Irrigation solution:  Tap water   Irrigation volume:  30 mL   Irrigation method:  Syringe   Visualized foreign bodies/material removed: no     Debridement:  None   Undermining:  None Skin repair:    Repair method:  Sutures   Suture size:  5-0   Suture material:  Prolene   Suture technique:  Simple interrupted   Number of sutures:  12 Approximation:    Approximation:  Close Repair type:    Repair type:  Simple Post-procedure details:    Dressing:  Non-adherent dressing and splint  for protection   Procedure completion:  Tolerated with difficulty  (including critical care time)  Medications Ordered in UC Medications  acetaminophen (TYLENOL) tablet 975 mg (975 mg Oral Given 10/16/21 1053)    Initial Impression / Assessment and Plan / UC Course  I have reviewed the triage vital signs and the nursing notes.  Pertinent labs & imaging results that were available during my care of the patient were reviewed by me and considered in my medical decision making (see chart for details).     Patient was incredibly upset during visit we discussed initially discussed potential utility of going to the emergency room, however, patient declined this.  With the help of nursing staff we were able to calm her and successfully place 12 sutures.  Discussed that she will need to keep this area clean with soap and water.  She was placed in a finger splint for protection.  Discussed that if she has any signs or symptoms of infection she needs to be seen immediately.  Discussed that she should follow-up with hand surgeon particularly if she has any decreased range of motion, numbness, pain.  She was given contact information for local provider.  Strict return precautions given.  Work excuse note provided.  Final Clinical Impressions(s) / UC Diagnoses   Final diagnoses:  Laceration of left index finger without foreign body without damage to nail, initial encounter     Discharge Instructions      Use the splint to provide protection.  Keep the area clean with  soap and water.  If you have any drainage or bleeding you need to be seen immediately.  If you have numbness or decreased range of motion of the finger you should follow-up with hand specialist.  Please call to schedule appointment.  If everything is healing normally you can return here in 10 to 14 days to have sutures removed.      ED Prescriptions   None    PDMP not reviewed this encounter.   Terrilee Croak, PA-C 10/16/21  1203

## 2021-11-02 ENCOUNTER — Encounter (HOSPITAL_COMMUNITY): Payer: Self-pay | Admitting: Emergency Medicine

## 2021-11-02 ENCOUNTER — Ambulatory Visit (HOSPITAL_COMMUNITY)
Admission: EM | Admit: 2021-11-02 | Discharge: 2021-11-02 | Disposition: A | Discharge status: Home / Self Care | Payer: Medicaid Other

## 2021-11-02 NOTE — ED Triage Notes (Signed)
Pt presents to UC for suture removal on left hand.

## 2021-11-02 NOTE — ED Notes (Addendum)
Provider at bedside to removal sutures.

## 2022-01-04 ENCOUNTER — Encounter (HOSPITAL_COMMUNITY): Payer: Self-pay

## 2022-01-04 ENCOUNTER — Ambulatory Visit (HOSPITAL_COMMUNITY)
Admission: EM | Admit: 2022-01-04 | Discharge: 2022-01-04 | Disposition: A | Discharge status: Home / Self Care | Payer: Medicaid Other | Attending: Emergency Medicine | Admitting: Emergency Medicine

## 2022-01-04 DIAGNOSIS — Z3202 Encounter for pregnancy test, result negative: Secondary | ICD-10-CM

## 2022-01-04 DIAGNOSIS — Z202 Contact with and (suspected) exposure to infections with a predominantly sexual mode of transmission: Secondary | ICD-10-CM

## 2022-01-04 DIAGNOSIS — Z113 Encounter for screening for infections with a predominantly sexual mode of transmission: Secondary | ICD-10-CM | POA: Diagnosis not present

## 2022-01-04 LAB — POC URINE PREG, ED: Preg Test, Ur: NEGATIVE

## 2022-01-04 LAB — HIV ANTIBODY (ROUTINE TESTING W REFLEX): HIV Screen 4th Generation wRfx: NONREACTIVE

## 2022-01-04 NOTE — Discharge Instructions (Addendum)
Urine pregnancy negative  Labs pending 2-3 days, you will be contacted if positive for any sti and treatment will be sent to the pharmacy, you will have to return to the clinic if positive for gonorrhea to receive treatment   Please refrain from having sex until labs results, if positive please refrain from having sex until treatment complete and symptoms resolve   If positive for HIV, Syphilis, Chlamydia  gonorrhea or trichomoniasis please notify partner or partners so they may tested as well  Moving forward, it is recommended you use some form of protection against the transmission of sti infections  such as condoms or dental dams with each sexual encounter

## 2022-01-04 NOTE — ED Provider Notes (Signed)
[facility]    CSN: [id number] Arrival date & time: 01/04/22  0913      History   Chief Complaint Chief Complaint  Patient presents with   SEXUALLY TRANSMITTED DISEASE    HPI Jacqueline Shepherd is a 31 y.o. female.   Patient presents for routine STI testing after partners infidelity.  Denies all symptoms.  Last menstrual period 12 /7, has IUD.     Past Medical History:  Diagnosis Date   Abdominal pain    Cannabinoid hyperemesis syndrome    Chlamydia infection complicating pregnancy in second trimester 08/18/2018   Negative test of cure on 08/06/18   Transient hypertension of pregnancy 10/12/2019   Weight loss 10/12/2019    Patient Active Problem List   Diagnosis Date Noted   Cannabis hyperemesis syndrome concurrent with and due to cannabis abuse (HCC) 06/04/2020   IUD (intrauterine device) in place 05/15/2020   Fetal malpresentation 05/14/2020   Preeclampsia 05/10/2020   Antepartum mild preeclampsia 05/07/2020   Carpal tunnel syndrome during pregnancy 04/15/2020   Gestational diabetes mellitus (GDM), antepartum 04/13/2020   Dysuria during pregnancy in second trimester 02/23/2020   Abnormal TSH 12/09/2019   Hyperemesis gravidarum, antepartum 11/22/2019   Tobacco use in pregnancy, antepartum, first trimester 11/11/2019   Other social stressor 11/11/2019   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   Obesity in pregnancy 10/12/2019   BMI 40.0-44.9, adult (HCC) 10/12/2019   Cannabinoid hyperemesis syndrome    Chlamydia infection affecting pregnancy in second trimester 08/18/2018   Rubella non-immune status, antepartum 08/18/2018    Past Surgical History:  Procedure Laterality Date   CESAREAN SECTION N/A 05/14/2020   Procedure: CESAREAN SECTION;  Surgeon: Conan Bowens, MD;  Location: [facility];  Service: Obstetrics;  Laterality: N/A;  Primary C/S malpresentation & IUD placement   WISDOM TOOTH EXTRACTION      OB History     Gravida  2   Para   1   Term  1   Preterm      AB  1   Living  1      SAB  1   IAB      Ectopic      Multiple  0   Live Births  1            Home Medications    Prior to Admission medications   Medication Sig Start Date End Date Taking? Authorizing Provider  buPROPion (WELLBUTRIN SR) 150 MG 12 hr tablet Take 1 tablet (150 mg total) by mouth at bedtime. 08/27/21 08/27/22  Bobbitt, Franchot Mimes, NP  buPROPion ER (WELLBUTRIN SR) 100 MG 12 hr tablet Take 1 tablet (100 mg total) by mouth daily. 08/27/21 08/27/22  Bobbitt, Franchot Mimes, NP  metroNIDAZOLE (FLAGYL) 500 MG tablet Take 1 tablet (500 mg total) by mouth 2 (two) times daily. 09/20/21   Lamptey, Britta Mccreedy, MD  naproxen (NAPROSYN) 500 MG tablet Take 1 tablet (500 mg total) by mouth 2 (two) times daily. 09/03/21   Roemhildt, Lorin T, PA-C  fluticasone (FLONASE) 50 MCG/ACT nasal spray Place 1 spray into both nostrils daily. Patient not taking: Reported on 07/03/2019 04/18/19 07/03/19  Henderly, Britni A, PA-C    Family History Family History  Problem Relation Age of Onset   Diabetes Mother    Hypertension Mother     Social History Social History   Tobacco Use   Smoking status: Some Days    Packs/day: 0.25    Types: Cigarettes  Last attempt to quit: 01/22/2020    Years since quitting: 1.9   Smokeless tobacco: Never  Vaping Use   Vaping Use: Never used  Substance Use Topics   Alcohol use: No   Drug use: Yes    Types: Marijuana    Comment: 05/06/20     Allergies   Ibuprofen   Review of Systems Review of Systems  Constitutional: Negative.   Respiratory: Negative.    Cardiovascular: Negative.   Genitourinary: Negative.   Musculoskeletal: Negative.   Skin: Negative.      Physical Exam Triage Vital Signs ED Triage Vitals [01/04/22 1109]  Enc Vitals Group     BP (!) 145/100     Pulse Rate 98     Resp 18     Temp 98.3 F (36.8 C)     Temp Source Oral     SpO2 96 %     Weight      Height      Head Circumference      Peak  Flow      Pain Score 0     Pain Loc      Pain Edu?      Excl. in GC?    No data found.  Updated Vital Signs BP (!) 145/100 (BP Location: Right Arm)   Pulse 98   Temp 98.3 F (36.8 C) (Oral)   Resp 18   LMP 12/29/2021 (Approximate)   SpO2 96%   Visual Acuity Right Eye Distance:   Left Eye Distance:   Bilateral Distance:    Right Eye Near:   Left Eye Near:    Bilateral Near:     Physical Exam Constitutional:      Appearance: Normal appearance.  HENT:     Head: Normocephalic.  Eyes:     Extraocular Movements: Extraocular movements intact.  Pulmonary:     Effort: Pulmonary effort is normal.  Genitourinary:    Comments: deferred Neurological:     Mental Status: [name] alert and oriented to person, place, and time. Mental status is at baseline.  Psychiatric:        Mood and Affect: Mood normal.        Behavior: Behavior normal.      UC Treatments / Results  Labs (all labs ordered are listed, but only abnormal results are displayed) Labs Reviewed  RPR  HIV ANTIBODY (ROUTINE TESTING W REFLEX)  POC URINE PREG, ED  CERVICOVAGINAL ANCILLARY ONLY    EKG   Radiology No results found.  Procedures Procedures (including critical care time)  Medications Ordered in UC Medications - No data to display  Initial Impression / Assessment and Plan / UC Course  I have reviewed the triage vital signs and the nursing notes.  Pertinent labs & imaging results that were available during my care of the patient were reviewed by me and considered in my medical decision making (see chart for details).  Routine screening for STI   Urine Pregnancy negative, STI labs pending will treat per protocol, advised abstinence until lab results, and/or treatment is complete, advised condom use during all sexual encounters moving, may follow-up with urgent care as needed  Final Clinical Impressions(s) / UC Diagnoses   Final diagnoses:  None     Discharge Instructions         Urine pregnancy negative  Labs pending 2-3 days, you will be contacted if positive for any sti and treatment will be sent to the pharmacy, you will have to return to  the clinic if positive for gonorrhea to receive treatment   Please refrain from having sex until labs results, if positive please refrain from having sex until treatment complete and symptoms resolve   If positive for HIV, Syphilis, Chlamydia  gonorrhea or trichomoniasis please notify partner or partners so they may tested as well  Moving forward, it is recommended you use some form of protection against the transmission of sti infections  such as condoms or dental dams with each sexual encounter     ED Prescriptions   None    PDMP not reviewed this encounter.   Valinda Hoar, NP 01/04/22 1135

## 2022-01-04 NOTE — ED Triage Notes (Signed)
STD testing, recently exposed to STD, wants blood test and pregnancy test too.

## 2022-01-05 ENCOUNTER — Telehealth: Payer: Self-pay | Admitting: Emergency Medicine

## 2022-01-05 LAB — CERVICOVAGINAL ANCILLARY ONLY
Bacterial Vaginitis (gardnerella): POSITIVE — AB
Candida Glabrata: NEGATIVE
Candida Vaginitis: NEGATIVE
Chlamydia: NEGATIVE
Comment: NEGATIVE
Comment: NEGATIVE
Comment: NEGATIVE
Comment: NEGATIVE
Comment: NEGATIVE
Comment: NORMAL
Neisseria Gonorrhea: NEGATIVE
Trichomonas: NEGATIVE

## 2022-01-05 LAB — RPR: RPR Ser Ql: NONREACTIVE

## 2022-01-05 MED ORDER — METRONIDAZOLE 500 MG PO TABS: 500.0000 mg | ORAL_TABLET | Freq: Two times a day (BID) | ORAL | 0 refills | Status: DC

## 2022-01-18 ENCOUNTER — Encounter (HOSPITAL_BASED_OUTPATIENT_CLINIC_OR_DEPARTMENT_OTHER): Payer: Self-pay | Admitting: Emergency Medicine

## 2022-01-18 ENCOUNTER — Emergency Department (HOSPITAL_BASED_OUTPATIENT_CLINIC_OR_DEPARTMENT_OTHER)
Admission: EM | Admit: 2022-01-18 | Discharge: 2022-01-18 | Disposition: A | Discharge status: Home / Self Care | Payer: Medicaid Other | Attending: Emergency Medicine | Admitting: Emergency Medicine

## 2022-01-18 ENCOUNTER — Other Ambulatory Visit: Payer: Self-pay

## 2022-01-18 ENCOUNTER — Emergency Department (HOSPITAL_BASED_OUTPATIENT_CLINIC_OR_DEPARTMENT_OTHER): Payer: Medicaid Other

## 2022-01-18 DIAGNOSIS — R197 Diarrhea, unspecified: Secondary | ICD-10-CM | POA: Insufficient documentation

## 2022-01-18 DIAGNOSIS — N9489 Other specified conditions associated with female genital organs and menstrual cycle: Secondary | ICD-10-CM | POA: Insufficient documentation

## 2022-01-18 DIAGNOSIS — R109 Unspecified abdominal pain: Secondary | ICD-10-CM | POA: Insufficient documentation

## 2022-01-18 DIAGNOSIS — R112 Nausea with vomiting, unspecified: Secondary | ICD-10-CM | POA: Diagnosis present

## 2022-01-18 LAB — COMPREHENSIVE METABOLIC PANEL
ALT: 17 U/L (ref 0–44)
AST: 22 U/L (ref 15–41)
Albumin: 4.7 g/dL (ref 3.5–5.0)
Alkaline Phosphatase: 56 U/L (ref 38–126)
Anion gap: 14 (ref 5–15)
BUN: 14 mg/dL (ref 6–20)
CO2: 25 mmol/L (ref 22–32)
Calcium: 9.8 mg/dL (ref 8.9–10.3)
Chloride: 98 mmol/L (ref 98–111)
Creatinine, Ser: 0.72 mg/dL (ref 0.44–1.00)
GFR, Estimated: >60 mL/min (ref >60)
Glucose, Bld: 129 mg/dL — ABNORMAL HIGH (ref 70–99)
Potassium: 4.1 mmol/L (ref 3.5–5.1)
Sodium: 137 mmol/L (ref 135–145)
Total Bilirubin: 0.4 mg/dL (ref 0.3–1.2)
Total Protein: 8.8 g/dL — ABNORMAL HIGH (ref 6.5–8.1)

## 2022-01-18 LAB — CBC
HCT: 43.4 % (ref 36.0–46.0)
Hemoglobin: 14.1 g/dL (ref 12.0–15.0)
MCH: 26.4 pg (ref 26.0–34.0)
MCHC: 32.5 g/dL (ref 30.0–36.0)
MCV: 81.1 fL (ref 80.0–100.0)
Platelets: 229 10*3/uL (ref 150–400)
RBC: 5.35 MIL/uL — ABNORMAL HIGH (ref 3.87–5.11)
RDW: 14.4 % (ref 11.5–15.5)
WBC: 7.8 10*3/uL (ref 4.0–10.5)
nRBC: 0 % (ref 0.0–0.2)

## 2022-01-18 LAB — URINALYSIS, ROUTINE W REFLEX MICROSCOPIC
Bacteria, UA: NONE SEEN
Bilirubin Urine: NEGATIVE
Glucose, UA: NEGATIVE mg/dL
Hgb urine dipstick: NEGATIVE
Ketones, ur: 40 mg/dL — AB
Leukocytes,Ua: NEGATIVE
Nitrite: NEGATIVE
Protein, ur: 30 mg/dL — AB
Specific Gravity, Urine: 1.028 (ref 1.005–1.030)
pH: 6.5 (ref 5.0–8.0)

## 2022-01-18 LAB — HCG, SERUM, QUALITATIVE: Preg, Serum: NEGATIVE

## 2022-01-18 LAB — PREGNANCY, URINE: Preg Test, Ur: NEGATIVE

## 2022-01-18 LAB — LIPASE, BLOOD: Lipase: 76 U/L — ABNORMAL HIGH (ref 11–51)

## 2022-01-18 MED ORDER — IOHEXOL 300 MG/ML  SOLN
100.0000 mL | Freq: Once | INTRAMUSCULAR | Status: AC | PRN
  Administered 2022-01-18: 100 mL via INTRAVENOUS

## 2022-01-18 MED ORDER — SODIUM CHLORIDE 0.9 % IV BOLUS
1000.0000 mL | Freq: Once | INTRAVENOUS | Status: AC
  Administered 2022-01-18: 1000 mL via INTRAVENOUS

## 2022-01-18 MED ORDER — ACETAMINOPHEN 10 MG/ML IV SOLN: 1000.0000 mg | Freq: Four times a day (QID) | INTRAVENOUS | Status: DC

## 2022-01-18 MED ORDER — HYDROMORPHONE HCL 1 MG/ML IJ SOLN
0.5000 mg | Freq: Once | INTRAMUSCULAR | Status: AC
  Administered 2022-01-18: 0.5 mg via INTRAVENOUS
  Filled 2022-01-18: qty 1

## 2022-01-18 MED ORDER — NAPROXEN 500 MG PO TABS: 500.0000 mg | ORAL_TABLET | Freq: Two times a day (BID) | ORAL | 0 refills | Status: DC

## 2022-01-18 MED ORDER — ONDANSETRON 4 MG PO TBDP: 4.0000 mg | ORAL_TABLET | Freq: Three times a day (TID) | ORAL | 0 refills | Status: DC | PRN

## 2022-01-18 MED ORDER — DROPERIDOL 2.5 MG/ML IJ SOLN
2.5000 mg | Freq: Once | INTRAMUSCULAR | Status: AC
  Administered 2022-01-18: 2.5 mg via INTRAVENOUS
  Filled 2022-01-18: qty 2

## 2022-01-18 MED ORDER — ONDANSETRON HCL 4 MG/2ML IJ SOLN: 4.0000 mg | Freq: Once | INTRAMUSCULAR | Status: DC

## 2022-01-18 NOTE — Discharge Instructions (Addendum)
Please take your medications as prescribed. Take tylenol/ibuprofen for pain. I recommend close follow-up with PCP for reevaluation.  Please do not hesitate to return to emergency department if worrisome signs symptoms we discussed become apparent.  

## 2022-01-18 NOTE — ED Triage Notes (Signed)
Pt arrives to ED via [facility] with c/o abdominal pain, diarrhea, vomiting that started last night.

## 2022-01-18 NOTE — ED Provider Notes (Signed)
Patient care was received at shift change from Riki Sheer, PA-C  In short 31 year old female history of cannabinoid hyperemesis presenting today for abdominal pain, diarrhea, nausea and vomiting that started 3 days ago.  Physical Exam  BP 122/86   Pulse 68   Temp 99.3 F (37.4 C) (Oral)   Resp 20   Ht 5\' 7"  (1.702 m)   Wt (!) 137.4 kg   LMP 12/29/2021 (Approximate)   SpO2 100%   BMI 47.46 kg/m   Physical Exam Vitals and nursing note reviewed.  Constitutional:      Appearance: Normal appearance.  HENT:     Head: Normocephalic and atraumatic.     Mouth/Throat:     Mouth: Mucous membranes are moist.  Eyes:     General: No scleral icterus. Cardiovascular:     Rate and Rhythm: Normal rate and regular rhythm.     Pulses: Normal pulses.     Heart sounds: Normal heart sounds.  Pulmonary:     Effort: Pulmonary effort is normal.     Breath sounds: Normal breath sounds.  Abdominal:     General: Abdomen is flat.     Palpations: Abdomen is soft.     Tenderness: There is abdominal tenderness in the right upper quadrant and epigastric area.  Musculoskeletal:        General: No deformity.  Skin:    General: Skin is warm.     Findings: No rash.  Neurological:     General: No focal deficit present.     Mental Status: [name] alert.  Psychiatric:        Mood and Affect: Mood normal.     Procedures  Procedures  ED Course / MDM    Medical Decision Making Amount and/or Complexity of Data Reviewed Labs: ordered. Radiology: ordered.  Risk Prescription drug management.   On physical examination, patient is afebrile and appears in no acute distress.  Abdomen is soft, nondistended, there was some tenderness to palpation to right upper quadrant and epigastric area.  CT scan with evidence of colitis and enteritis.  I reevaluated patient. She tolerates p.o., nausea has improved and she wants to go home.  I sent an Rx of Zofran for nausea and naproxen for  pain.  Disposition Continued outpatient therapy. Follow-up with PCP recommended for reevaluation of symptoms. Treatment plan discussed with patient.  Pt acknowledged understanding was agreeable to the plan. Worrisome signs and symptoms were discussed with patient, and patient acknowledged understanding to return to the ED if they noticed these signs and symptoms. Patient was stable upon discharge.       14/07/2021, PA 01/18/22 2328    01/20/22, MD 01/25/22 [phone]

## 2022-01-18 NOTE — ED Provider Notes (Signed)
[facility] Provider Note   CSN: [id number] Arrival date & time: 01/18/22  [id number]     History  Chief Complaint  Patient presents with   Abdominal Pain   Diarrhea   Emesis   HPI Jacqueline Shepherd is a 31 y.o. female with h/o cannabinoid hyperemesis presenting for abdominal pain, diarrhea, nausea and vomiting.  Started 3 days ago.  States that started all of a sudden around 4 AM while laying in bed.  States that vomiting has been "violent" and persistent. And she has had occasional diarrhea.  Also endorses associated nausea.  States that she had "2 joints" that day when her symptoms started. Denies alcohol consumption.  Endorses epigastric pain that radiates to her back.  Denies fever.  Denies urinary changes.  Emesis and diarrhea is nonbloody.   Abdominal Pain Associated symptoms: diarrhea and vomiting   Diarrhea Associated symptoms: abdominal pain and vomiting   Emesis Associated symptoms: abdominal pain and diarrhea        Home Medications Prior to Admission medications   Medication Sig Start Date End Date Taking? Authorizing Provider  buPROPion (WELLBUTRIN SR) 150 MG 12 hr tablet Take 1 tablet (150 mg total) by mouth at bedtime. 08/27/21 08/27/22  Bobbitt, Lennie Muckle, NP  buPROPion ER (WELLBUTRIN SR) 100 MG 12 hr tablet Take 1 tablet (100 mg total) by mouth daily. 08/27/21 08/27/22  Bobbitt, Lennie Muckle, NP  metroNIDAZOLE (FLAGYL) 500 MG tablet Take 1 tablet (500 mg total) by mouth 2 (two) times daily. 01/05/22   Lamptey, Myrene Galas, MD  naproxen (NAPROSYN) 500 MG tablet Take 1 tablet (500 mg total) by mouth 2 (two) times daily. 09/03/21   Roemhildt, Lorin T, PA-C  fluticasone (FLONASE) 50 MCG/ACT nasal spray Place 1 spray into both nostrils daily. Patient not taking: Reported on 07/03/2019 04/18/19 07/03/19  Henderly, Britni A, PA-C      Allergies    Ibuprofen    Review of Systems   Review of Systems  Gastrointestinal:  Positive for abdominal pain, diarrhea  and vomiting.     Physical Exam   Vitals:   01/18/22 1530 01/18/22 1545  BP:  (!) 100/51  Pulse:    Resp:    Temp:    SpO2: 95%     CONSTITUTIONAL:  ill-appearing, NAD, multiple occurrences of vomiting during exam NEURO:  Alert and oriented x 3, CN 3-12 grossly intact EYES:  eyes equal and reactive ENT/NECK:  Supple, no stridor  CARDIO:  regular rate and rhythm, appears well-perfused  PULM:  No respiratory distress, CTAB GI/GU:  non-distended, soft, RUQ and epigastric tendeness MSK/SPINE:  No gross deformities, no edema, moves all extremities  SKIN:  no rash, atraumatic  *Additional and/or pertinent findings included in MDM below   ED Results / Procedures / Treatments   Labs (all labs ordered are listed, but only abnormal results are displayed) Labs Reviewed  COMPREHENSIVE METABOLIC PANEL - Abnormal; Notable for the following components:      Result Value   Glucose, Bld 129 (*)    Total Protein 8.8 (*)    All other components within normal limits  LIPASE, BLOOD - Abnormal; Notable for the following components:   Lipase 76 (*)    All other components within normal limits  CBC - Abnormal; Notable for the following components:   RBC 5.35 (*)    All other components within normal limits  URINALYSIS, ROUTINE W REFLEX MICROSCOPIC - Abnormal; Notable for the following components:   Ketones, ur  40 (*)    Protein, ur 30 (*)    All other components within normal limits  HCG, SERUM, QUALITATIVE  PREGNANCY, URINE    EKG EKG Interpretation  Date/Time:  Wednesday January 18 2022 14:15:59 EST Ventricular Rate:  57 PR Interval:  145 QRS Duration: 107 QT Interval:  472 QTC Calculation: 460 R Axis:   89 Text Interpretation: Sinus rhythm Probable left ventricular hypertrophy ST elev, probable normal early repol pattern No significant change since 6/23 Confirmed by Meridee Score [id number]) on 01/18/2022 2:26:26 PM  Radiology CT ABDOMEN PELVIS W CONTRAST  Result Date:  01/18/2022 CLINICAL DATA:  Acute nonlocalized abdominal pain in a 31 year old female. EXAM: CT ABDOMEN AND PELVIS WITH CONTRAST TECHNIQUE: Multidetector CT imaging of the abdomen and pelvis was performed using the standard protocol following bolus administration of intravenous contrast. RADIATION DOSE REDUCTION: This exam was performed according to the departmental dose-optimization program which includes automated exposure control, adjustment of the mA and/or kV according to patient size and/or use of iterative reconstruction technique. CONTRAST:  OMNIPAQUE IOHEXOL 300 MG/ML  SOLN COMPARISON:  July 16, 2021 FINDINGS: Lower chest: Basilar atelectasis. No effusion. No consolidative changes. Hepatobiliary: No focal, suspicious hepatic lesion. No pericholecystic stranding. No biliary duct dilation. Portal vein is patent. Mild to moderate hepatic steatosis. Pancreas: Normal, without mass, inflammation or ductal dilatation. Spleen: Normal. Adrenals/Urinary Tract: Adrenal glands are unremarkable. Symmetric renal enhancement. No sign of hydronephrosis. No suspicious renal lesion or perinephric stranding. Urinary bladder is grossly unremarkable. Urinary bladder is collapsed/under distended limiting assessment. Stomach/Bowel: No acute gastric process. Proximal small bowel with normal appearance. Mural stratification of distal small bowel loops beginning in the mid to distal ileum. Mildly patulous appearance of bowel suggests early pseudo sacculation (image 82/2) no signs of bowel obstruction. Mild mucosal hyperenhancement throughout distal ileum. Normal appendix. No signs of colonic obstruction. Mild haustra thickening of the ascending colon is suggested. Query mild stranding about the hepatic flexure though there is motion artifact in this location. Vascular/Lymphatic: Retroaortic LEFT renal vein. Normal caliber of the abdominal aorta no adenopathy in the upper abdomen or retroperitoneum. No mesenteric  lymphadenopathy. No pelvic lymphadenopathy. Reproductive: IUD in-situ. Other: No ascites.  No pneumoperitoneum. Musculoskeletal: No acute or significant osseous findings. IMPRESSION: 1. Mural stratification of distal small bowel loops beginning in the mid to distal ileum. Mildly patulous appearance of bowel suggests early pseudo sacculation. Mild mucosal hyperenhancement throughout distal ileum. Findings suggest mild enteritis perhaps related to inflammatory bowel disease. Findings are nonspecific and should be correlated with any history of repeated abdominal pain. In the appropriate clinical setting could consider GI follow-up and follow-up nonemergent CT enterography for further evaluation. 2. Query mild colitis particularly about the hepatic flexure. Correlate with any risk factors for infectious colitis/C difficile such is recent antibiotic administration. 3. Normal appendix 4. Mild to moderate hepatic steatosis. 5. IUD in-situ. Electronically Signed   By: Donzetta Kohut M.D.   On: 01/18/2022 15:17    Procedures Procedures    Medications Ordered in ED Medications  acetaminophen (OFIRMEV) IV 1,000 mg (has no administration in time range)  sodium chloride 0.9 % bolus 1,000 mL (1,000 mLs Intravenous New Bag/Given 01/18/22 1434)  HYDROmorphone (DILAUDID) injection 0.5 mg (0.5 mg Intravenous Given 01/18/22 1440)  droperidol (INAPSINE) 2.5 MG/ML injection 2.5 mg (2.5 mg Intravenous Given 01/18/22 1438)  iohexol (OMNIPAQUE) 300 MG/ML solution 100 mL (100 mLs Intravenous Contrast Given 01/18/22 1446)    ED Course/ Medical Decision Making/ A&P  Medical Decision Making Amount and/or Complexity of Data Reviewed Labs: ordered. Radiology: ordered.  Risk Prescription drug management.   Initial Impression and Ddx 31 yo female who is ill-appearing, nontoxic but otherwise hemodynamically stable presenting for abdominal pain, nausea vomiting diarrhea.  Physical exam notable  for multiple occurrences of emesis along with right upper quadrant and epigastric tenderness.  Differential diagnosis for this complaint includes pancreatitis, cholecystitis, cannabinoid hyperemesis syndrome, and viral gastroenteritis. Patient PMH that increases complexity of ED encounter: History of cannabinoid hyperemesis  Interpretation of Diagnostics I independent reviewed and interpreted the labs as followed: Elevated lipase and hyperglycemia  - I independently visualized the following imaging with scope of interpretation limited to determining acute life threatening conditions related to emergency care: CT abdomen/pelvis, which revealed  1. Mural stratification of distal small bowel loops beginning in the  mid to distal ileum. Mildly patulous appearance of bowel suggests  early pseudo sacculation. Mild mucosal hyperenhancement throughout  distal ileum. Findings suggest mild enteritis perhaps related to  inflammatory bowel disease. Findings are nonspecific and should be  correlated with any history of repeated abdominal pain. In the  appropriate clinical setting could consider GI follow-up and  follow-up nonemergent CT enterography for further evaluation.  2. Query mild colitis particularly about the hepatic flexure.  Correlate with any risk factors for infectious colitis/C difficile  such is recent antibiotic administration.  3. Normal appendix  4. Mild to moderate hepatic steatosis.  5. IUD in-situ.    Patient Reassessment and Ultimate Disposition/Management Treated nausea and vomiting with droperidol.  Treated pain with Dilaudid.  Upon reevaluation, patient states she was still in some pain but nausea and vomiting had improved somewhat.  Treated pain further with Tylenol.  CT scan by largest nonspecific with some concerns for possible colitis.  Signed out patient to PA Rex Kras for further management of her nausea and vomiting.  Symptoms could be related to cannabinoid hyperemesis  versus viral gastroenteritis given she has also had some diarrhea.  Patient management required discussion with the following services or consulting groups:  None  Complexity of Problems Addressed Acute complicated illness or Injury  Additional Data Reviewed and Analyzed Further history obtained from: None  Patient Encounter Risk Assessment Consideration of hospitalization         Final Clinical Impression(s) / ED Diagnoses Final diagnoses:  Abdominal pain, unspecified abdominal location  Nausea and vomiting, unspecified vomiting type    Rx / DC Orders ED Discharge Orders     None         Harriet Pho, PA-C 01/18/22 1612    Hayden Rasmussen, MD 01/18/22 Vernelle Emerald

## 2022-03-21 ENCOUNTER — Emergency Department (HOSPITAL_BASED_OUTPATIENT_CLINIC_OR_DEPARTMENT_OTHER)
Admission: EM | Admit: 2022-03-21 | Discharge: 2022-03-22 | Disposition: A | Discharge status: Home / Self Care | Payer: Medicaid Other | Attending: Emergency Medicine | Admitting: Emergency Medicine

## 2022-03-21 ENCOUNTER — Other Ambulatory Visit: Payer: Self-pay

## 2022-03-21 ENCOUNTER — Emergency Department (HOSPITAL_BASED_OUTPATIENT_CLINIC_OR_DEPARTMENT_OTHER): Payer: Medicaid Other

## 2022-03-21 DIAGNOSIS — R112 Nausea with vomiting, unspecified: Secondary | ICD-10-CM | POA: Insufficient documentation

## 2022-03-21 DIAGNOSIS — R1084 Generalized abdominal pain: Secondary | ICD-10-CM | POA: Insufficient documentation

## 2022-03-21 DIAGNOSIS — F12188 Cannabis abuse with other cannabis-induced disorder: Secondary | ICD-10-CM

## 2022-03-21 LAB — COMPREHENSIVE METABOLIC PANEL
ALT: 9 U/L (ref 0–44)
AST: 13 U/L — ABNORMAL LOW (ref 15–41)
Albumin: 4.3 g/dL (ref 3.5–5.0)
Alkaline Phosphatase: 45 U/L (ref 38–126)
Anion gap: 10 (ref 5–15)
BUN: 13 mg/dL (ref 6–20)
CO2: 23 mmol/L (ref 22–32)
Calcium: 9.3 mg/dL (ref 8.9–10.3)
Chloride: 105 mmol/L (ref 98–111)
Creatinine, Ser: 0.85 mg/dL (ref 0.44–1.00)
GFR, Estimated: >60 mL/min (ref >60)
Glucose, Bld: 162 mg/dL — ABNORMAL HIGH (ref 70–99)
Potassium: 3.9 mmol/L (ref 3.5–5.1)
Sodium: 138 mmol/L (ref 135–145)
Total Bilirubin: 0.4 mg/dL (ref 0.3–1.2)
Total Protein: 7.8 g/dL (ref 6.5–8.1)

## 2022-03-21 LAB — CBC WITH DIFFERENTIAL/PLATELET
Abs Immature Granulocytes: 0.03 10*3/uL (ref 0.00–0.07)
Basophils Absolute: 0 10*3/uL (ref 0.0–0.1)
Basophils Relative: 0 %
Eosinophils Absolute: 0 10*3/uL (ref 0.0–0.5)
Eosinophils Relative: 0 %
HCT: 40.5 % (ref 36.0–46.0)
Hemoglobin: 13.1 g/dL (ref 12.0–15.0)
Immature Granulocytes: 0 %
Lymphocytes Relative: 13 %
Lymphs Abs: 1.4 10*3/uL (ref 0.7–4.0)
MCH: 26.5 pg (ref 26.0–34.0)
MCHC: 32.3 g/dL (ref 30.0–36.0)
MCV: 82 fL (ref 80.0–100.0)
Monocytes Absolute: 0.3 10*3/uL (ref 0.1–1.0)
Monocytes Relative: 3 %
Neutro Abs: 8.7 10*3/uL — ABNORMAL HIGH (ref 1.7–7.7)
Neutrophils Relative %: 84 %
Platelets: 220 10*3/uL (ref 150–400)
RBC: 4.94 MIL/uL (ref 3.87–5.11)
RDW: 14.2 % (ref 11.5–15.5)
WBC: 10.4 10*3/uL (ref 4.0–10.5)
nRBC: 0 % (ref 0.0–0.2)

## 2022-03-21 LAB — LIPASE, BLOOD: Lipase: <10 U/L — ABNORMAL LOW (ref 11–51)

## 2022-03-21 LAB — HCG, SERUM, QUALITATIVE: Preg, Serum: NEGATIVE

## 2022-03-21 MED ORDER — FENTANYL CITRATE PF 50 MCG/ML IJ SOSY
50.0000 ug | PREFILLED_SYRINGE | Freq: Once | INTRAMUSCULAR | Status: AC
  Administered 2022-03-21: 50 ug via INTRAVENOUS
  Filled 2022-03-21: qty 1

## 2022-03-21 MED ORDER — ONDANSETRON HCL 4 MG/2ML IJ SOLN
4.0000 mg | Freq: Once | INTRAMUSCULAR | Status: AC
  Administered 2022-03-21: 4 mg via INTRAVENOUS
  Filled 2022-03-21: qty 2

## 2022-03-21 NOTE — ED Notes (Signed)
Pt refusing to sign MSE waiver, states she's in too much pain & wants to lay down. Pt taken back out to lobby where [place] tech was waiting.

## 2022-03-21 NOTE — ED Triage Notes (Signed)
Pt via [facility] for eval of acute onset 10/10 generalized abd pain x2hrs after eating; hx gallstones, pt reports ongoing issues w same x8yr. N/V, denies diarrhea.   No IV, no meds PTA 162 sys pressure HR 66 CBG 117

## 2022-03-21 NOTE — ED Notes (Signed)
Pt. Ordered fentanyl for pain. In to give med and found IV in lac kinked in three places d/t pt. Bending her arm repeatedly and flailing around. IV fentanyl given but the IV was lost after the injection.

## 2022-03-21 NOTE — ED Triage Notes (Signed)
Pt states "oh my god I'm bout to doo doo on myself- bathroom, now!!" Pt wheeled to the RR, advised on call light attached to the wall. Triage to continue upon return

## 2022-03-22 ENCOUNTER — Emergency Department (HOSPITAL_BASED_OUTPATIENT_CLINIC_OR_DEPARTMENT_OTHER): Payer: Medicaid Other

## 2022-03-22 MED ORDER — ONDANSETRON HCL 4 MG/2ML IJ SOLN
4.0000 mg | Freq: Once | INTRAMUSCULAR | Status: AC
  Administered 2022-03-22: 4 mg via INTRAVENOUS
  Filled 2022-03-22: qty 2

## 2022-03-22 MED ORDER — ONDANSETRON 8 MG PO TBDP: ORAL_TABLET | ORAL | 0 refills | Status: DC

## 2022-03-22 MED ORDER — MORPHINE SULFATE (PF) 4 MG/ML IV SOLN
4.0000 mg | Freq: Once | INTRAVENOUS | Status: DC
  Filled 2022-03-22: qty 1

## 2022-03-22 MED ORDER — HYDROMORPHONE HCL 1 MG/ML IJ SOLN
1.0000 mg | Freq: Once | INTRAMUSCULAR | Status: AC
  Administered 2022-03-22: 1 mg via INTRAVENOUS
  Filled 2022-03-22: qty 1

## 2022-03-22 MED ORDER — SODIUM CHLORIDE 0.9 % IV BOLUS
1000.0000 mL | Freq: Once | INTRAVENOUS | Status: AC
  Administered 2022-03-22: 1000 mL via INTRAVENOUS

## 2022-03-22 NOTE — ED Notes (Signed)
Writer and Jinny Blossom, RN went into patient's room to remove IV and go over discharge instructions. Writer gave patient paperwork and explained discharge instructions/follow up information. Pt verbalized understanding. Writer and Jinny Blossom, RN then informed patient that she was discharged and that she can get dressed to leave. Patient asked Probation officer and Jinny Blossom to step out so she could dress privately. Writer noticed 5 minutes later that the light was out in patient's room and patient was lying in bed. Writer reiterated that patient was discharged and that she would need to get out of the bed and wait in the lobby for her ride. Patient then shouting and cursing at Probation officer and [place]. Patient assisted to lobby in wheelchair. Pt states she called her dad and that he would be picking her up. Pt refused discharge vital signs.

## 2022-03-22 NOTE — ED Provider Notes (Signed)
[facility] Provider Note   CSN: [id number] Arrival date & time: 03/21/22  1844     History  Chief Complaint  Patient presents with   Abdominal Pain    Jacqueline Shepherd is a 32 y.o. female.  Patient is a 32 year old female with past medical history of cannabis hyperemesis syndrome, obesity.  Patient presenting today for evaluation of generalized abdominal pain and vomiting.  This started earlier this evening.  Patient states that this is what happens when she "smokes too much".  She denies diarrhea.  She denies bloody stool or vomit.  She denies any fevers or chills.  She denies ill contacts or having consumed any undercooked or suspicious foods.  The history is provided by the patient.       Home Medications Prior to Admission medications   Medication Sig Start Date End Date Taking? Authorizing Provider  buPROPion (WELLBUTRIN SR) 150 MG 12 hr tablet Take 1 tablet (150 mg total) by mouth at bedtime. 08/27/21 08/27/22  Bobbitt, Lennie Muckle, NP  buPROPion ER (WELLBUTRIN SR) 100 MG 12 hr tablet Take 1 tablet (100 mg total) by mouth daily. 08/27/21 08/27/22  Bobbitt, Lennie Muckle, NP  metroNIDAZOLE (FLAGYL) 500 MG tablet Take 1 tablet (500 mg total) by mouth 2 (two) times daily. 01/05/22   Lamptey, Myrene Galas, MD  naproxen (NAPROSYN) 500 MG tablet Take 1 tablet (500 mg total) by mouth 2 (two) times daily. 09/03/21   Roemhildt, Lorin T, PA-C  naproxen (NAPROSYN) 500 MG tablet Take 1 tablet (500 mg total) by mouth 2 (two) times daily. 01/18/22   Rex Kras, PA  ondansetron (ZOFRAN-ODT) 4 MG disintegrating tablet Take 1 tablet (4 mg total) by mouth every 8 (eight) hours as needed for nausea or vomiting. 01/18/22   Rex Kras, PA  fluticasone (FLONASE) 50 MCG/ACT nasal spray Place 1 spray into both nostrils daily. Patient not taking: Reported on 07/03/2019 04/18/19 07/03/19  Henderly, Britni A, PA-C      Allergies    Ibuprofen    Review of Systems   Review of  Systems  All other systems reviewed and are negative.   Physical Exam Updated Vital Signs BP (!) 168/94 (BP Location: Left Arm)   Pulse (!) 52   Temp 98.2 F (36.8 C)   Resp (!) 28   SpO2 100%  Physical Exam Vitals and nursing note reviewed.  Constitutional:      General: [name] not in acute distress.    Appearance: [name] well-developed. [name] not diaphoretic.     Comments: Patient awake and alert.  [name] very dramatic and flailing and thrashing.  HENT:     Head: Normocephalic and atraumatic.  Cardiovascular:     Rate and Rhythm: Normal rate and regular rhythm.     Heart sounds: No murmur heard.    No friction rub. No gallop.  Pulmonary:     Effort: Pulmonary effort is normal. No respiratory distress.     Breath sounds: Normal breath sounds. No wheezing.  Abdominal:     General: Bowel sounds are normal. There is no distension.     Palpations: Abdomen is soft.     Tenderness: There is generalized abdominal tenderness. There is no right CVA tenderness, left CVA tenderness, guarding or rebound.  Musculoskeletal:        General: Normal range of motion.     Cervical back: Normal range of motion and neck supple.  Skin:    General: Skin  is warm and dry.  Neurological:     General: No focal deficit present.     Mental Status: [name] alert and oriented to person, place, and time.     ED Results / Procedures / Treatments   Labs (all labs ordered are listed, but only abnormal results are displayed) Labs Reviewed  CBC WITH DIFFERENTIAL/PLATELET - Abnormal; Notable for the following components:      Result Value   Neutro Abs 8.7 (*)    All other components within normal limits  COMPREHENSIVE METABOLIC PANEL - Abnormal; Notable for the following components:   Glucose, Bld 162 (*)    AST 13 (*)    All other components within normal limits  LIPASE, BLOOD - Abnormal; Notable for the following components:   Lipase <10 (*)    All other components within normal limits  HCG,  SERUM, QUALITATIVE  CBC WITH DIFFERENTIAL/PLATELET    EKG None  Radiology No results found.  Procedures Procedures    Medications Ordered in ED Medications  sodium chloride 0.9 % bolus 1,000 mL (has no administration in time range)  ondansetron (ZOFRAN) injection 4 mg (has no administration in time range)  HYDROmorphone (DILAUDID) injection 1 mg (has no administration in time range)  ondansetron (ZOFRAN) injection 4 mg (4 mg Intravenous Given 03/21/22 2057)  fentaNYL (SUBLIMAZE) injection 50 mcg (50 mcg Intravenous Given 03/21/22 2330)    ED Course/ Medical Decision Making/ A&P  Patient is a 32 year old female with history of cannabis induced hyperemesis and obesity.  Patient presenting with complaints of nausea, vomiting, and abdominal pain starting earlier this evening.  She tells me this is what happens when she "smokes too much pot".  Patient arrives here with stable vital signs.  Her behavior is quite dramatic with writhing and flailing around the exam room.  Workup initiated including CBC, metabolic panel, and lipase, all of which are unremarkable.  Pregnancy test is negative.  IV access was established and patient was initially receiving IV fluids, however due to her writhing and flailing about, her IV came out.  Patient was given IM pain and nausea medications and is now tolerating liquids.  At this point, patient seems appropriate for discharge.  [name] much more calm and will be discharged with outpatient follow-up.  Final Clinical Impression(s) / ED Diagnoses Final diagnoses:  None    Rx / DC Orders ED Discharge Orders     None         Veryl Speak, MD 03/22/22 [phone]

## 2022-03-22 NOTE — Discharge Instructions (Addendum)
Refrain from marijuana use.  Take Zofran as prescribed as needed for nausea  Clear liquids for the next 12 hours then advance to normal as tolerated.

## 2022-03-22 NOTE — ED Notes (Signed)
During this visit, pt. Refused to leave her BP cuff on, peed in a bedpan and then put it on the floor, spilling it, without telling any staff.

## 2022-03-23 ENCOUNTER — Emergency Department (HOSPITAL_BASED_OUTPATIENT_CLINIC_OR_DEPARTMENT_OTHER)
Admission: EM | Admit: 2022-03-23 | Discharge: 2022-03-23 | Disposition: A | Discharge status: Home / Self Care | Payer: Medicaid Other | Attending: Emergency Medicine | Admitting: Emergency Medicine

## 2022-03-23 ENCOUNTER — Emergency Department (HOSPITAL_BASED_OUTPATIENT_CLINIC_OR_DEPARTMENT_OTHER): Payer: Medicaid Other

## 2022-03-23 ENCOUNTER — Other Ambulatory Visit (HOSPITAL_BASED_OUTPATIENT_CLINIC_OR_DEPARTMENT_OTHER): Payer: Self-pay

## 2022-03-23 ENCOUNTER — Encounter (HOSPITAL_BASED_OUTPATIENT_CLINIC_OR_DEPARTMENT_OTHER): Payer: Self-pay | Admitting: Emergency Medicine

## 2022-03-23 ENCOUNTER — Other Ambulatory Visit: Payer: Self-pay

## 2022-03-23 DIAGNOSIS — R1012 Left upper quadrant pain: Secondary | ICD-10-CM | POA: Insufficient documentation

## 2022-03-23 DIAGNOSIS — E876 Hypokalemia: Secondary | ICD-10-CM | POA: Diagnosis not present

## 2022-03-23 DIAGNOSIS — R112 Nausea with vomiting, unspecified: Secondary | ICD-10-CM | POA: Diagnosis not present

## 2022-03-23 DIAGNOSIS — R1084 Generalized abdominal pain: Secondary | ICD-10-CM

## 2022-03-23 LAB — CBC WITH DIFFERENTIAL/PLATELET
Abs Immature Granulocytes: 0.04 10*3/uL (ref 0.00–0.07)
Basophils Absolute: 0 10*3/uL (ref 0.0–0.1)
Basophils Relative: 0 %
Eosinophils Absolute: 0 10*3/uL (ref 0.0–0.5)
Eosinophils Relative: 0 %
HCT: 48.1 % — ABNORMAL HIGH (ref 36.0–46.0)
Hemoglobin: 15.9 g/dL — ABNORMAL HIGH (ref 12.0–15.0)
Immature Granulocytes: 0 %
Lymphocytes Relative: 14 %
Lymphs Abs: 1.4 10*3/uL (ref 0.7–4.0)
MCH: 26.3 pg (ref 26.0–34.0)
MCHC: 33.1 g/dL (ref 30.0–36.0)
MCV: 79.5 fL — ABNORMAL LOW (ref 80.0–100.0)
Monocytes Absolute: 0.7 10*3/uL (ref 0.1–1.0)
Monocytes Relative: 6 %
Neutro Abs: 8.4 10*3/uL — ABNORMAL HIGH (ref 1.7–7.7)
Neutrophils Relative %: 80 %
Platelets: 243 10*3/uL (ref 150–400)
RBC: 6.05 MIL/uL — ABNORMAL HIGH (ref 3.87–5.11)
RDW: 13.9 % (ref 11.5–15.5)
WBC: 10.5 10*3/uL (ref 4.0–10.5)
nRBC: 0 % (ref 0.0–0.2)

## 2022-03-23 LAB — COMPREHENSIVE METABOLIC PANEL
ALT: 16 U/L (ref 0–44)
AST: 21 U/L (ref 15–41)
Albumin: 4.4 g/dL (ref 3.5–5.0)
Alkaline Phosphatase: 62 U/L (ref 38–126)
Anion gap: 12 (ref 5–15)
BUN: 15 mg/dL (ref 6–20)
CO2: 24 mmol/L (ref 22–32)
Calcium: 9.2 mg/dL (ref 8.9–10.3)
Chloride: 98 mmol/L (ref 98–111)
Creatinine, Ser: 0.96 mg/dL (ref 0.44–1.00)
GFR, Estimated: >60 mL/min (ref >60)
Glucose, Bld: 111 mg/dL — ABNORMAL HIGH (ref 70–99)
Potassium: 2.9 mmol/L — ABNORMAL LOW (ref 3.5–5.1)
Sodium: 134 mmol/L — ABNORMAL LOW (ref 135–145)
Total Bilirubin: 0.6 mg/dL (ref 0.3–1.2)
Total Protein: 9.1 g/dL — ABNORMAL HIGH (ref 6.5–8.1)

## 2022-03-23 LAB — MAGNESIUM: Magnesium: 2.2 mg/dL (ref 1.7–2.4)

## 2022-03-23 MED ORDER — PROMETHAZINE HCL 25 MG RE SUPP
25.0000 mg | Freq: Four times a day (QID) | RECTAL | 0 refills | Status: DC | PRN
  Filled 2022-03-23 (×2): qty 12, 3d supply, fill #0

## 2022-03-23 MED ORDER — POTASSIUM CHLORIDE ER 20 MEQ PO TBCR: 40.0000 meq | EXTENDED_RELEASE_TABLET | Freq: Every day | ORAL | 0 refills | Status: DC

## 2022-03-23 MED ORDER — POTASSIUM CHLORIDE ER 20 MEQ PO TBCR
40.0000 meq | EXTENDED_RELEASE_TABLET | Freq: Every day | ORAL | 0 refills | Status: DC
  Filled 2022-03-23: qty 6, 3d supply, fill #0

## 2022-03-23 MED ORDER — SODIUM CHLORIDE 0.9 % IV BOLUS
1000.0000 mL | Freq: Once | INTRAVENOUS | Status: AC
  Administered 2022-03-23: 1000 mL via INTRAVENOUS

## 2022-03-23 MED ORDER — IOHEXOL 300 MG/ML  SOLN
100.0000 mL | Freq: Once | INTRAMUSCULAR | Status: AC | PRN
  Administered 2022-03-23: 100 mL via INTRAVENOUS

## 2022-03-23 MED ORDER — DROPERIDOL 2.5 MG/ML IJ SOLN
2.5000 mg | Freq: Once | INTRAMUSCULAR | Status: AC
  Administered 2022-03-23: 2.5 mg via INTRAVENOUS
  Filled 2022-03-23: qty 2

## 2022-03-23 MED ORDER — POTASSIUM CHLORIDE 10 MEQ/100ML IV SOLN
10.0000 meq | INTRAVENOUS | Status: AC
  Administered 2022-03-23 (×4): 10 meq via INTRAVENOUS
  Filled 2022-03-23 (×2): qty 100

## 2022-03-23 MED ORDER — ONDANSETRON HCL 4 MG/2ML IJ SOLN
4.0000 mg | Freq: Once | INTRAMUSCULAR | Status: AC
  Administered 2022-03-23: 4 mg via INTRAVENOUS
  Filled 2022-03-23: qty 2

## 2022-03-23 MED ORDER — MORPHINE SULFATE (PF) 4 MG/ML IV SOLN
4.0000 mg | Freq: Once | INTRAVENOUS | Status: AC
  Administered 2022-03-23: 4 mg via INTRAVENOUS
  Filled 2022-03-23: qty 1

## 2022-03-23 NOTE — ED Notes (Signed)
Patient transported to CT 

## 2022-03-23 NOTE — ED Triage Notes (Signed)
Persistent emesis x 2 days . Gallbladder issues she said , abd pain . Pt is restless and in obvious distress.

## 2022-03-23 NOTE — ED Notes (Signed)
Presents with abd pain x 2 days, noted to be moaning a lot, states she cannot stop vomiting. Very tender to palpation, primarily left side of abdomen. No active vomiting noted here, states she is extremely nauseated. Describes pain as sharp

## 2022-03-23 NOTE — ED Notes (Signed)
3rd bag of K initiated

## 2022-03-23 NOTE — ED Provider Notes (Signed)
[facility] AT [facility] Provider Note   CSN: [id number] Arrival date & time: 03/23/22  [id number]     History  Chief Complaint  Patient presents with   Emesis    Jacqueline Shepherd is a 32 y.o. female, history of Cannabis hyperemesis syndrome, who presents to the ED secondary to diffuse abdominal pain, that is worse on the left upper quadrant, that has been going on for last couple days.  Endorses diffuse nausea, vomiting, that has been intractable.  States that she has not been able to keep anything down for the last couple days, and is unable to make any urine.  Denies any fever, chills, urinary symptoms, diarrhea.  Denies any chest pain or shortness of breath.  Pain is sharp and stabbing, and has gotten worse over the last couple days.     Home Medications Prior to Admission medications   Medication Sig Start Date End Date Taking? Authorizing Provider  promethazine (PHENERGAN) 25 MG suppository Place 1 suppository (25 mg total) rectally every 6 (six) hours as needed for nausea or vomiting. 03/23/22  Yes Isabell Bonafede L, PA  buPROPion (WELLBUTRIN SR) 150 MG 12 hr tablet Take 1 tablet (150 mg total) by mouth at bedtime. 08/27/21 08/27/22  Bobbitt, Lennie Muckle, NP  buPROPion ER (WELLBUTRIN SR) 100 MG 12 hr tablet Take 1 tablet (100 mg total) by mouth daily. 08/27/21 08/27/22  Bobbitt, Lennie Muckle, NP  metroNIDAZOLE (FLAGYL) 500 MG tablet Take 1 tablet (500 mg total) by mouth 2 (two) times daily. 01/05/22   Lamptey, Myrene Galas, MD  naproxen (NAPROSYN) 500 MG tablet Take 1 tablet (500 mg total) by mouth 2 (two) times daily. 09/03/21   Roemhildt, Lorin T, PA-C  naproxen (NAPROSYN) 500 MG tablet Take 1 tablet (500 mg total) by mouth 2 (two) times daily. 01/18/22   Rex Kras, PA  ondansetron (ZOFRAN-ODT) 8 MG disintegrating tablet '8mg'$  ODT q4 hours prn nausea 03/22/22   Veryl Speak, MD  Potassium Chloride ER 20 MEQ TBCR Take 2 tablets (40 mEq total) by mouth daily. 03/23/22   Paelyn Smick,  Chelsei Mcchesney L, PA  fluticasone (FLONASE) 50 MCG/ACT nasal spray Place 1 spray into both nostrils daily. Patient not taking: Reported on 07/03/2019 04/18/19 07/03/19  Henderly, Britni A, PA-C      Allergies    Ibuprofen    Review of Systems   Review of Systems  Gastrointestinal:  Positive for abdominal pain and vomiting. Negative for diarrhea.    Physical Exam Updated Vital Signs BP 109/67   Pulse 63   Temp 98.1 F (36.7 C) (Oral)   Resp 18   Wt 136.1 kg   SpO2 94%   BMI 46.99 kg/m  Physical Exam Vitals and nursing note reviewed.  Constitutional:      General: [name] not in acute distress.    Appearance: [name] well-developed.  HENT:     Head: Normocephalic and atraumatic.     Mouth/Throat:     Mouth: Mucous membranes are dry.  Eyes:     Conjunctiva/sclera: Conjunctivae normal.  Cardiovascular:     Rate and Rhythm: Normal rate and regular rhythm.     Heart sounds: No murmur heard. Pulmonary:     Effort: Pulmonary effort is normal. No respiratory distress.     Breath sounds: Normal breath sounds.  Abdominal:     Palpations: Abdomen is soft.     Tenderness: There is generalized abdominal tenderness.     Comments: Diffuse abdominal pain, without  guarding, worse on the left upper quadrant.  Musculoskeletal:        General: No swelling.     Cervical back: Neck supple.  Skin:    General: Skin is warm and dry.     Capillary Refill: Capillary refill takes less than 2 seconds.  Neurological:     Mental Status: [name] alert.  Psychiatric:        Mood and Affect: Mood normal.     ED Results / Procedures / Treatments   Labs (all labs ordered are listed, but only abnormal results are displayed) Labs Reviewed  CBC WITH DIFFERENTIAL/PLATELET - Abnormal; Notable for the following components:      Result Value   RBC 6.05 (*)    Hemoglobin 15.9 (*)    HCT 48.1 (*)    MCV 79.5 (*)    Neutro Abs 8.4 (*)    All other components within normal limits  COMPREHENSIVE METABOLIC  PANEL - Abnormal; Notable for the following components:   Sodium 134 (*)    Potassium 2.9 (*)    Glucose, Bld 111 (*)    Total Protein 9.1 (*)    All other components within normal limits  MAGNESIUM    EKG EKG Interpretation  Date/Time:  Thursday March 23 2022 11:39:22 EST Ventricular Rate:  59 PR Interval:  142 QRS Duration: 107 QT Interval:  467 QTC Calculation: 463 R Axis:   81 Text Interpretation: Sinus arrhythmia Biatrial enlargement No significant change since last tracing Confirmed by Deno Etienne [phone]) on 03/23/2022 1:20:07 PM  Radiology CT ABDOMEN PELVIS W CONTRAST  Result Date: 03/23/2022 CLINICAL DATA:  Abdominal pain and vomiting for the past 2 days. EXAM: CT ABDOMEN AND PELVIS WITH CONTRAST TECHNIQUE: Multidetector CT imaging of the abdomen and pelvis was performed using the standard protocol following bolus administration of intravenous contrast. RADIATION DOSE REDUCTION: This exam was performed according to the departmental dose-optimization program which includes automated exposure control, adjustment of the mA and/or kV according to patient size and/or use of iterative reconstruction technique. CONTRAST:  181m OMNIPAQUE IOHEXOL 300 MG/ML  SOLN COMPARISON:  CT abdomen pelvis dated January 18, 2022. FINDINGS: Lower chest: No acute abnormality. Hepatobiliary: Unchanged mild diffusely decreased liver density. No focal liver abnormality. The gallbladder is unremarkable. No biliary dilatation. Pancreas: Unremarkable. No pancreatic ductal dilatation or surrounding inflammatory changes. Spleen: Normal in size without focal abnormality. Adrenals/Urinary Tract: Adrenal glands are unremarkable. Kidneys are normal, without renal calculi, focal lesion, or hydronephrosis. Bladder is unremarkable. Stomach/Bowel: Unchanged Terrisha Lopata hiatal hernia. The stomach is otherwise within normal limits. No bowel wall thickening, distention, or surrounding inflammatory changes. Normal appendix.  Vascular/Lymphatic: No significant vascular findings are present. No enlarged abdominal or pelvic lymph nodes. Reproductive: Unchanged IUD appropriately positioned within the endometrial canal. No adnexal mass. Other: No free fluid or pneumoperitoneum. Musculoskeletal: No acute or significant osseous findings. IMPRESSION: 1. No acute intra-abdominal process. 2. Unchanged hepatic steatosis. Electronically Signed   By: WTitus DubinM.D.   On: 03/23/2022 12:15    Procedures Procedures    Medications Ordered in ED Medications  potassium chloride 10 mEq in 100 mL IVPB (10 mEq Intravenous New Bag/Given 03/23/22 1513)  sodium chloride 0.9 % bolus 1,000 mL (0 mLs Intravenous Stopped 03/23/22 1210)  ondansetron (ZOFRAN) injection 4 mg (4 mg Intravenous Given 03/23/22 1052)  morphine (PF) 4 MG/ML injection 4 mg (4 mg Intravenous Given 03/23/22 1059)  iohexol (OMNIPAQUE) 300 MG/ML solution 100 mL (100 mLs Intravenous Contrast Given 03/23/22 1202)  droperidol (INAPSINE) 2.5 MG/ML injection 2.5 mg (2.5 mg Intravenous Given 03/23/22 1305)    ED Course/ Medical Decision Making/ A&P                             Medical Decision Making Patient is a 31 year old female, here for abdominal pain, nausea, vomiting days.  She states that she went to the [facility] ER, and was discharged and had a resurgence of her symptoms.  She states that she is still feeling very bad, and cannot tolerate anything to eat or drink.  We will obtain labs, for further evaluation as well as a CT abdomen pelvis as she says she has severe left upper quadrant pain, and she cannot keep anything down.    Amount and/or Complexity of Data Reviewed Labs: ordered.    Details: Hypokalemia of 2.9, normal magnesium, Radiology: ordered.    Details: CT abdomen/pelvis without acute findings Discussion of management or test interpretation with external provider(s): Discussed with patient, normal CT abdomen pelvis, vomiting likely secondary to  cannabis hyperemesis syndrome, discussed supportive treatment, she was able to tolerate p.o. while in the ER.  And feels much better.  Discharged with potassium as well as Phenergan suppositories to help with her nausea/vomiting, as well as replenish her potassium.  I instructed her she will need to follow-up with her primary care doctor in 1 week to recheck her potassium to make sure that is improved.    Risk Prescription drug management.    Final Clinical Impression(s) / ED Diagnoses Final diagnoses:  Hypokalemia  Nausea and vomiting, unspecified vomiting type  Generalized abdominal pain    Rx / DC Orders ED Discharge Orders          Ordered    potassium chloride 20 MEQ TBCR  Daily,   Status:  Discontinued        03/23/22 1600    Potassium Chloride ER 20 MEQ TBCR  Daily        03/23/22 1601    promethazine (PHENERGAN) 25 MG suppository  Every 6 hours PRN        03/23/22 1602              Oziah Vitanza Carlean Jews, PA 03/23/22 Bushnell, [place], DO 03/24/22 [phone]

## 2022-03-23 NOTE — ED Notes (Signed)
34mq K bag #2 initiated

## 2022-03-23 NOTE — ED Notes (Signed)
Cont to complain of abd pain and having some nausea. Father at bedside. Explained medication being administered. Comfort measures provided

## 2022-03-23 NOTE — Discharge Instructions (Addendum)
Please follow-up with your primary care doctor, make sure you are drinking lots of fluids, and resting.  Take the potassium as prescribed, and follow-up with your primary care doctor in 1 week to recheck your electrolytes.  If you start having intractable nausea, vomiting worsening abdominal pain please return to the ER.

## 2022-03-23 NOTE — ED Notes (Signed)
ED Provider at bedside. 

## 2022-03-24 ENCOUNTER — Other Ambulatory Visit: Payer: Self-pay

## 2022-03-24 ENCOUNTER — Emergency Department (HOSPITAL_COMMUNITY)
Admission: EM | Admit: 2022-03-24 | Discharge: 2022-03-24 | Disposition: A | Discharge status: Home / Self Care | Payer: Medicaid Other | Attending: Emergency Medicine | Admitting: Emergency Medicine

## 2022-03-24 ENCOUNTER — Encounter (HOSPITAL_COMMUNITY): Payer: Self-pay

## 2022-03-24 DIAGNOSIS — F17200 Nicotine dependence, unspecified, uncomplicated: Secondary | ICD-10-CM | POA: Insufficient documentation

## 2022-03-24 DIAGNOSIS — R112 Nausea with vomiting, unspecified: Secondary | ICD-10-CM

## 2022-03-24 DIAGNOSIS — R1084 Generalized abdominal pain: Secondary | ICD-10-CM | POA: Diagnosis not present

## 2022-03-24 LAB — URINALYSIS, W/ REFLEX TO CULTURE (INFECTION SUSPECTED)
Bacteria, UA: NONE SEEN
Bilirubin Urine: NEGATIVE
Glucose, UA: NEGATIVE mg/dL
Hgb urine dipstick: NEGATIVE
Ketones, ur: 20 mg/dL — AB
Nitrite: NEGATIVE
Protein, ur: 100 mg/dL — AB
Specific Gravity, Urine: 1.029 (ref 1.005–1.030)
pH: 8 (ref 5.0–8.0)

## 2022-03-24 LAB — CBC WITH DIFFERENTIAL/PLATELET
Abs Immature Granulocytes: 0.06 10*3/uL (ref 0.00–0.07)
Basophils Absolute: 0 10*3/uL (ref 0.0–0.1)
Basophils Relative: 0 %
Eosinophils Absolute: 0 10*3/uL (ref 0.0–0.5)
Eosinophils Relative: 0 %
HCT: 44.7 % (ref 36.0–46.0)
Hemoglobin: 14.6 g/dL (ref 12.0–15.0)
Immature Granulocytes: 1 %
Lymphocytes Relative: 22 %
Lymphs Abs: 2.5 10*3/uL (ref 0.7–4.0)
MCH: 26.7 pg (ref 26.0–34.0)
MCHC: 32.7 g/dL (ref 30.0–36.0)
MCV: 81.9 fL (ref 80.0–100.0)
Monocytes Absolute: 1 10*3/uL (ref 0.1–1.0)
Monocytes Relative: 9 %
Neutro Abs: 7.7 10*3/uL (ref 1.7–7.7)
Neutrophils Relative %: 68 %
Platelets: 233 10*3/uL (ref 150–400)
RBC: 5.46 MIL/uL — ABNORMAL HIGH (ref 3.87–5.11)
RDW: 13.9 % (ref 11.5–15.5)
WBC: 11.3 10*3/uL — ABNORMAL HIGH (ref 4.0–10.5)
nRBC: 0 % (ref 0.0–0.2)

## 2022-03-24 LAB — COMPREHENSIVE METABOLIC PANEL
ALT: 17 U/L (ref 0–44)
AST: 24 U/L (ref 15–41)
Albumin: 3.9 g/dL (ref 3.5–5.0)
Alkaline Phosphatase: 49 U/L (ref 38–126)
Anion gap: 11 (ref 5–15)
BUN: 14 mg/dL (ref 6–20)
CO2: 25 mmol/L (ref 22–32)
Calcium: 9 mg/dL (ref 8.9–10.3)
Chloride: 99 mmol/L (ref 98–111)
Creatinine, Ser: 0.86 mg/dL (ref 0.44–1.00)
GFR, Estimated: >60 mL/min (ref >60)
Glucose, Bld: 115 mg/dL — ABNORMAL HIGH (ref 70–99)
Potassium: 3.4 mmol/L — ABNORMAL LOW (ref 3.5–5.1)
Sodium: 135 mmol/L (ref 135–145)
Total Bilirubin: 0.7 mg/dL (ref 0.3–1.2)
Total Protein: 8.1 g/dL (ref 6.5–8.1)

## 2022-03-24 LAB — MAGNESIUM: Magnesium: 1.7 mg/dL (ref 1.7–2.4)

## 2022-03-24 LAB — I-STAT BETA HCG BLOOD, ED (MC, WL, AP ONLY): I-stat hCG, quantitative: <5 m[IU]/mL (ref <5)

## 2022-03-24 LAB — LIPASE, BLOOD: Lipase: 24 U/L (ref 11–51)

## 2022-03-24 MED ORDER — DROPERIDOL 2.5 MG/ML IJ SOLN
2.5000 mg | Freq: Once | INTRAMUSCULAR | Status: AC
  Administered 2022-03-24: 2.5 mg via INTRAMUSCULAR
  Filled 2022-03-24: qty 2

## 2022-03-24 MED ORDER — HYDROMORPHONE HCL 1 MG/ML IJ SOLN
1.0000 mg | Freq: Once | INTRAMUSCULAR | Status: AC
  Administered 2022-03-24: 1 mg via INTRAVENOUS
  Filled 2022-03-24: qty 1

## 2022-03-24 MED ORDER — DICYCLOMINE HCL 10 MG/ML IM SOLN
20.0000 mg | Freq: Once | INTRAMUSCULAR | Status: AC
  Administered 2022-03-24: 20 mg via INTRAMUSCULAR
  Filled 2022-03-24: qty 2

## 2022-03-24 MED ORDER — METOCLOPRAMIDE HCL 5 MG/ML IJ SOLN
10.0000 mg | Freq: Once | INTRAMUSCULAR | Status: AC
  Administered 2022-03-24: 10 mg via INTRAVENOUS
  Filled 2022-03-24: qty 2

## 2022-03-24 MED ORDER — METOCLOPRAMIDE HCL 10 MG PO TABS: 10.0000 mg | ORAL_TABLET | Freq: Three times a day (TID) | ORAL | 0 refills | Status: DC | PRN

## 2022-03-24 MED ORDER — ONDANSETRON 4 MG PO TBDP: 4.0000 mg | ORAL_TABLET | Freq: Three times a day (TID) | ORAL | 0 refills | Status: DC | PRN

## 2022-03-24 MED ORDER — POTASSIUM CHLORIDE CRYS ER 20 MEQ PO TBCR
40.0000 meq | EXTENDED_RELEASE_TABLET | Freq: Once | ORAL | Status: AC
  Administered 2022-03-24: 40 meq via ORAL
  Filled 2022-03-24: qty 2

## 2022-03-24 MED ORDER — LACTATED RINGERS IV BOLUS
1000.0000 mL | Freq: Once | INTRAVENOUS | Status: AC
  Administered 2022-03-24: 1000 mL via INTRAVENOUS

## 2022-03-24 MED ORDER — DICYCLOMINE HCL 20 MG PO TABS: 20.0000 mg | ORAL_TABLET | Freq: Three times a day (TID) | ORAL | 0 refills | Status: DC

## 2022-03-24 MED ORDER — HALOPERIDOL LACTATE 5 MG/ML IJ SOLN: 5.0000 mg | Freq: Once | INTRAMUSCULAR | Status: DC

## 2022-03-24 NOTE — ED Provider Notes (Signed)
[facility] Provider Note   CSN: [id number] Arrival date & time: 03/24/22  1116     History  Chief Complaint  Patient presents with   Abdominal Pain   Nausea    Jacqueline Shepherd is a 32 y.o. female.  HPI 32 year old female presents with abdominal pain and vomiting.  Symptoms originally started on 2/27.  She has had this before and it has been attributed to marijuana.  She states she smokes daily.  However she has been having severe, sharp pain throughout her entire abdomen since 2/27.  Has been to 2 different ED's but states her pain is not better.  [name] still vomiting.  She has not had a bowel movement and per her report she has not urinated in the last 4 days.  She denies fevers, chest pain, shortness of breath.  She has not been able to take the potassium and has not tried the Phenergan suppository because she states both required her to eat, which she cannot.  Home Medications Prior to Admission medications   Medication Sig Start Date End Date Taking? Authorizing Provider  dicyclomine (BENTYL) 20 MG tablet Take 1 tablet (20 mg total) by mouth 3 (three) times daily before meals. 03/24/22  Yes Sherwood Gambler, MD  metoCLOPramide (REGLAN) 10 MG tablet Take 1 tablet (10 mg total) by mouth every 8 (eight) hours as needed for nausea. 03/24/22  Yes Sherwood Gambler, MD  ondansetron (ZOFRAN-ODT) 4 MG disintegrating tablet Take 1 tablet (4 mg total) by mouth every 8 (eight) hours as needed for nausea or vomiting. 03/24/22  Yes Sherwood Gambler, MD  buPROPion [facility] SR) 150 MG 12 hr tablet Take 1 tablet (150 mg total) by mouth at bedtime. 08/27/21 08/27/22  Bobbitt, Lennie Muckle, NP  buPROPion ER (WELLBUTRIN SR) 100 MG 12 hr tablet Take 1 tablet (100 mg total) by mouth daily. 08/27/21 08/27/22  Bobbitt, Lennie Muckle, NP  metroNIDAZOLE (FLAGYL) 500 MG tablet Take 1 tablet (500 mg total) by mouth 2 (two) times daily. 01/05/22   Lamptey, Myrene Galas, MD  naproxen  (NAPROSYN) 500 MG tablet Take 1 tablet (500 mg total) by mouth 2 (two) times daily. 09/03/21   Roemhildt, Lorin T, PA-C  naproxen (NAPROSYN) 500 MG tablet Take 1 tablet (500 mg total) by mouth 2 (two) times daily. 01/18/22   Rex Kras, PA  Potassium Chloride ER 20 MEQ TBCR Take 2 tablets (40 mEq total) by mouth daily. 03/23/22   Small, Brooke L, PA  promethazine (PHENERGAN) 25 MG suppository Place 1 suppository (25 mg total) rectally every 6 (six) hours as needed for nausea or vomiting. 03/23/22   Small, Brooke L, PA  fluticasone (FLONASE) 50 MCG/ACT nasal spray Place 1 spray into both nostrils daily. Patient not taking: Reported on 07/03/2019 04/18/19 07/03/19  Henderly, Britni A, PA-C      Allergies    Ibuprofen    Review of Systems   Review of Systems  Constitutional:  Negative for fever.  Respiratory:  Negative for shortness of breath.   Cardiovascular:  Negative for chest pain.  Gastrointestinal:  Positive for abdominal pain, constipation, nausea and vomiting. Negative for diarrhea.  Genitourinary:  Negative for dysuria.    Physical Exam Updated Vital Signs BP 122/69   Pulse 61   Temp 98.9 F (37.2 C) (Oral)   Resp 16   Ht '5\' 7"'$  (1.702 m)   Wt 136.1 kg   SpO2 96%   BMI 46.99 kg/m  Physical  Exam Vitals and nursing note reviewed.  Constitutional:      Appearance: [name] well-developed. [name] obese. [name] not ill-appearing or diaphoretic.  HENT:     Head: Normocephalic and atraumatic.  Cardiovascular:     Rate and Rhythm: Normal rate and regular rhythm.     Heart sounds: Normal heart sounds.  Pulmonary:     Effort: Pulmonary effort is normal.     Breath sounds: Normal breath sounds.  Abdominal:     Palpations: Abdomen is soft.     Tenderness: There is generalized abdominal tenderness.  Skin:    General: Skin is warm and dry.  Neurological:     Mental Status: [name] alert.     ED Results / Procedures / Treatments   Labs (all labs ordered are listed, but only  abnormal results are displayed) Labs Reviewed  COMPREHENSIVE METABOLIC PANEL - Abnormal; Notable for the following components:      Result Value   Potassium 3.4 (*)    Glucose, Bld 115 (*)    All other components within normal limits  CBC WITH DIFFERENTIAL/PLATELET - Abnormal; Notable for the following components:   WBC 11.3 (*)    RBC 5.46 (*)    All other components within normal limits  LIPASE, BLOOD  MAGNESIUM  URINALYSIS, W/ REFLEX TO CULTURE (INFECTION SUSPECTED)  I-STAT BETA HCG BLOOD, ED ([facility], [facility], [facility] ONLY)    EKG EKG Interpretation  Date/Time:  Friday March 24 2022 16:11:42 EST Ventricular Rate:  67 PR Interval:  140 QRS Duration: 107 QT Interval:  433 QTC Calculation: 458 R Axis:   103 Text Interpretation: Sinus rhythm Right atrial enlargement Probable right ventricular hypertrophy Confirmed by Sherwood Gambler [phone]) on 03/24/2022 4:13:47 PM  Radiology CT ABDOMEN PELVIS W CONTRAST  Result Date: 03/23/2022 CLINICAL DATA:  Abdominal pain and vomiting for the past 2 days. EXAM: CT ABDOMEN AND PELVIS WITH CONTRAST TECHNIQUE: Multidetector CT imaging of the abdomen and pelvis was performed using the standard protocol following bolus administration of intravenous contrast. RADIATION DOSE REDUCTION: This exam was performed according to the departmental dose-optimization program which includes automated exposure control, adjustment of the mA and/or kV according to patient size and/or use of iterative reconstruction technique. CONTRAST:  146m OMNIPAQUE IOHEXOL 300 MG/ML  SOLN COMPARISON:  CT abdomen pelvis dated January 18, 2022. FINDINGS: Lower chest: No acute abnormality. Hepatobiliary: Unchanged mild diffusely decreased liver density. No focal liver abnormality. The gallbladder is unremarkable. No biliary dilatation. Pancreas: Unremarkable. No pancreatic ductal dilatation or surrounding inflammatory changes. Spleen: Normal in size without focal abnormality. Adrenals/Urinary Tract:  Adrenal glands are unremarkable. Kidneys are normal, without renal calculi, focal lesion, or hydronephrosis. Bladder is unremarkable. Stomach/Bowel: Unchanged small hiatal hernia. The stomach is otherwise within normal limits. No bowel wall thickening, distention, or surrounding inflammatory changes. Normal appendix. Vascular/Lymphatic: No significant vascular findings are present. No enlarged abdominal or pelvic lymph nodes. Reproductive: Unchanged IUD appropriately positioned within the endometrial canal. No adnexal mass. Other: No free fluid or pneumoperitoneum. Musculoskeletal: No acute or significant osseous findings. IMPRESSION: 1. No acute intra-abdominal process. 2. Unchanged hepatic steatosis. Electronically Signed   By: WTitus DubinM.D.   On: 03/23/2022 12:15    Procedures Procedures    Medications Ordered in ED Medications  potassium chloride SA (KLOR-CON M) CR tablet 40 mEq (has no administration in time range)  droperidol (INAPSINE) 2.5 MG/ML injection 2.5 mg (has no administration in time range)  lactated ringers bolus 1,000 mL (0 mLs  Intravenous Stopped 03/24/22 1540)  metoCLOPramide (REGLAN) injection 10 mg (10 mg Intravenous Given 03/24/22 1158)  HYDROmorphone (DILAUDID) injection 1 mg (1 mg Intravenous Given 03/24/22 1158)  HYDROmorphone (DILAUDID) injection 1 mg (1 mg Intravenous Given 03/24/22 1332)  dicyclomine (BENTYL) injection 20 mg (20 mg Intramuscular Given 03/24/22 1531)    ED Course/ Medical Decision Making/ A&P                             Medical Decision Making Amount and/or Complexity of Data Reviewed Labs: ordered.    Details: Mild hypokalemia, improved from yesterday Minimal WBC elevation ECG/medicine tests: independent interpretation performed.    Details: QTC under 500. No acute ischemia  Risk Prescription drug management.   Patient presents with continued abdominal pain. Had an unremarkable CT yesterday. Vomiting is better, but still reports uncontrolled  pain. Has had bentyl, dilaudid x 2 and reglan. Will try Haldol. Care transferred to Dr. Langston Masker. I don't think repeat imaging is warranted.         Final Clinical Impression(s) / ED Diagnoses Final diagnoses:  None    Rx / DC Orders ED Discharge Orders          Ordered    ondansetron (ZOFRAN-ODT) 4 MG disintegrating tablet  Every 8 hours PRN        03/24/22 1630    metoCLOPramide (REGLAN) 10 MG tablet  Every 8 hours PRN        03/24/22 1630    dicyclomine (BENTYL) 20 MG tablet  3 times daily before meals        03/24/22 1630              Sherwood Gambler, MD 03/24/22 1639

## 2022-03-24 NOTE — ED Notes (Signed)
Pt urinated in the bed. Linens changed and Purewick placed at this time

## 2022-03-24 NOTE — Discharge Instructions (Signed)
Nausea medications were prescribed for home.  Please make every effort to eat light foods for the next 2 days, liquids and soft foods, like applesauce and smoothies.  Try to avoid meats, starches, greasy and fast food at least for 2 days until your stomach settles down.  Please also try to avoid any alcohol or marijuana use, both of which can trigger vomiting.

## 2022-03-24 NOTE — ED Provider Notes (Signed)
32 yo female here with abdominal pain Multiple ED visits, CT yesterday which was unremarkable Labs today unremarkable  Physical Exam  BP 122/69   Pulse 61   Temp 98.9 F (37.2 C) (Oral)   Resp 16   Ht '5\' 7"'$  (1.702 m)   Wt 136.1 kg   SpO2 96%   BMI 46.99 kg/m   Physical Exam  Procedures  Procedures  ED Course / MDM    Medical Decision Making Amount and/or Complexity of Data Reviewed Labs: ordered.  Risk Prescription drug management.    Workup unremarkable Symptoms improved with droperidol  The patient was monitored for 1 hour after receiving droperidol.  She was mentating well, able to ambulate steadily, tolerating p.o.  She was repeatedly requesting discharge paperwork, and has a ride here to take her home safely.  EDP Dr Verta Ellen prescribed oral antiemetics for home      Jacqueline Dusky, MD 03/24/22 1740

## 2022-03-24 NOTE — ED Triage Notes (Signed)
Pt arrives via [facility] for abd pain, n/v for 3 days. Pt has been seen twice at other facilities for the same, is not taking prescribed medications 22g wrist, '4mg'$  Zofran by EMS 160/80 BP 155 cbg

## 2022-04-06 ENCOUNTER — Other Ambulatory Visit: Payer: Self-pay

## 2022-04-06 ENCOUNTER — Encounter (HOSPITAL_COMMUNITY): Payer: Self-pay | Admitting: Emergency Medicine

## 2022-04-06 ENCOUNTER — Ambulatory Visit (HOSPITAL_COMMUNITY)
Admission: EM | Admit: 2022-04-06 | Discharge: 2022-04-06 | Disposition: A | Discharge status: Home / Self Care | Payer: Medicaid Other | Attending: Sports Medicine | Admitting: Sports Medicine

## 2022-04-06 DIAGNOSIS — R220 Localized swelling, mass and lump, head: Secondary | ICD-10-CM

## 2022-04-06 MED ORDER — NAPROXEN 500 MG PO TABS: 500.0000 mg | ORAL_TABLET | Freq: Two times a day (BID) | ORAL | 0 refills | Status: DC

## 2022-04-06 NOTE — Discharge Instructions (Signed)
I have sent some naproxen to your pharmacy for pain and swelling, per your record he had this medication in the past.  Recommend continuing with ice for swelling and pain.  Follow-up if your symptoms worsen or fail to improve.

## 2022-04-06 NOTE — ED Notes (Signed)
Received work note

## 2022-04-06 NOTE — ED Triage Notes (Signed)
Pt states she got her jewelry for her lip change yesterday and today her lip was very swollen and sore.

## 2022-04-06 NOTE — ED Provider Notes (Signed)
[facility]    CSN: [id number] Arrival date & time: 04/06/22  [id number]      History   Chief Complaint Chief Complaint  Patient presents with   Oral Swelling    HPI Jacqueline Shepherd is a 32 y.o. female.   [name] here today with chief complaint of left-sided upper lip swelling after she had her piercing changed yesterday.  She reports she had 1 sharp twinge of pain while changing her piercing however no pain for the rest of the day, however woke up with pain and localized swelling this morning.  She has used ice and heat but does not take any medications yet.  She denies any numbness, tingling, drooling or similar reaction to this in the past.  She did replace her piercing with some new jewelry that was silver and she typically wears gold. Patient reports to me that she has no drug allergies, states that she has taken ibuprofen before with no problems in the past.  [name] also previously been prescribed naproxen with no problems.  [name] unsure why her medical record reports an ibuprofen allergy.     Past Medical History:  Diagnosis Date   Abdominal pain    Cannabinoid hyperemesis syndrome    Chlamydia infection complicating pregnancy in second trimester 08/18/2018   Negative test of cure on 08/06/18   Transient hypertension of pregnancy 10/12/2019   Weight loss 10/12/2019    Patient Active Problem List   Diagnosis Date Noted   Cannabis hyperemesis syndrome concurrent with and due to cannabis abuse ([place]) 06/04/2020   IUD (intrauterine device) in place 05/15/2020   Fetal malpresentation 05/14/2020   Preeclampsia 05/10/2020   Antepartum mild preeclampsia 05/07/2020   Carpal tunnel syndrome during pregnancy 04/15/2020   Gestational diabetes mellitus (GDM), antepartum 04/13/2020   Dysuria during pregnancy in second trimester 02/23/2020   Abnormal TSH 12/09/2019   Hyperemesis gravidarum, antepartum 11/22/2019   Tobacco use in pregnancy, antepartum, first trimester  11/11/2019   Other social stressor 11/11/2019   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   Obesity in pregnancy 10/12/2019   BMI 40.0-44.9, adult ([place]) 10/12/2019   Cannabinoid hyperemesis syndrome    Chlamydia infection affecting pregnancy in second trimester 08/18/2018   Rubella non-immune status, antepartum 08/18/2018    Past Surgical History:  Procedure Laterality Date   CESAREAN SECTION N/A 05/14/2020   Procedure: CESAREAN SECTION;  Surgeon: Sloan Leiter, MD;  Location: [facility] LD [facility]S;  Service: Obstetrics;  Laterality: N/A;  Primary C/S malpresentation & IUD placement   WISDOM TOOTH EXTRACTION      OB History     Gravida  2   Para  1   Term  1   Preterm      AB  1   Living  1      SAB  1   IAB      Ectopic      Multiple  0   Live Births  1            Home Medications    Prior to Admission medications   Medication Sig Start Date End Date Taking? Authorizing Provider  buPROPion (WELLBUTRIN SR) 150 MG 12 hr tablet Take 1 tablet (150 mg total) by mouth at bedtime. 08/27/21 08/27/22  Bobbitt, Lennie Muckle, NP  buPROPion ER (WELLBUTRIN SR) 100 MG 12 hr tablet Take 1 tablet (100 mg total) by mouth daily. 08/27/21 08/27/22  Bobbitt, Lennie Muckle, NP  dicyclomine (BENTYL) 20 MG tablet  Take 1 tablet (20 mg total) by mouth 3 (three) times daily before meals. 03/24/22   Sherwood Gambler, MD  metoCLOPramide (REGLAN) 10 MG tablet Take 1 tablet (10 mg total) by mouth every 8 (eight) hours as needed for nausea. 03/24/22   Sherwood Gambler, MD  metroNIDAZOLE (FLAGYL) 500 MG tablet Take 1 tablet (500 mg total) by mouth 2 (two) times daily. 01/05/22   Lamptey, Myrene Galas, MD  naproxen (NAPROSYN) 500 MG tablet Take 1 tablet (500 mg total) by mouth 2 (two) times daily. 04/06/22   Rodena Goldmann A, DO  ondansetron (ZOFRAN-ODT) 4 MG disintegrating tablet Take 1 tablet (4 mg total) by mouth every 8 (eight) hours as needed for nausea or vomiting. 03/24/22   Sherwood Gambler, MD  Potassium  Chloride ER 20 MEQ TBCR Take 2 tablets (40 mEq total) by mouth daily. 03/23/22   Small, Brooke L, PA  promethazine (PHENERGAN) 25 MG suppository Place 1 suppository (25 mg total) rectally every 6 (six) hours as needed for nausea or vomiting. 03/23/22   Small, Brooke L, PA  fluticasone (FLONASE) 50 MCG/ACT nasal spray Place 1 spray into both nostrils daily. Patient not taking: Reported on 07/03/2019 04/18/19 07/03/19  Henderly, Britni A, PA-C    Family History Family History  Problem Relation Age of Onset   Diabetes Mother    Hypertension Mother     Social History Social History   Tobacco Use   Smoking status: Some Days    Packs/day: .25    Types: Cigarettes    Last attempt to quit: 01/22/2020    Years since quitting: 2.2   Smokeless tobacco: Never  Vaping Use   Vaping Use: Never used  Substance Use Topics   Alcohol use: No   Drug use: Yes    Types: Marijuana    Comment: 05/06/20     Allergies   Ibuprofen   Review of Systems Review of Systems as listed above in HPI   Physical Exam Triage Vital Signs ED Triage Vitals  Enc Vitals Group     BP 04/06/22 0934 (!) 145/95     Pulse Rate 04/06/22 0934 74     Resp 04/06/22 0934 18     Temp 04/06/22 0934 97.8 F (36.6 C)     Temp Source 04/06/22 0934 Oral     SpO2 04/06/22 0934 99 %     Weight 04/06/22 0936 300 lb (136.1 kg)     Height 04/06/22 0936 '5\' 7"'$  (1.702 m)     Head Circumference --      Peak Flow --      Pain Score 04/06/22 0936 5     Pain Loc --      Pain Edu? --      Excl. in [place]? --    No data found.  Updated Vital Signs BP (!) 145/95 (BP Location: Right Arm)   Pulse 74   Temp 97.8 F (36.6 C) (Oral)   Resp 18   Ht '5\' 7"'$  (1.702 m)   Wt 136.1 kg   SpO2 99%   BMI 46.99 kg/m   Physical Exam Vitals reviewed.  Constitutional:      General: [name] not in acute distress.    Appearance: Normal appearance. [name] obese. [name] not ill-appearing, toxic-appearing or diaphoretic.  HENT:     Nose: Nose  normal.     Mouth/Throat:     Mouth: Mucous membranes are moist.     Comments: Swelling of the left side upper lip  surrounding piercing site.  No drainage from the area, erythema or induration.  Tenderness to palpation of the site.  On the inside of the lip there is a small piercing hole that also looks clean with no erythema or drainage. Cardiovascular:     Rate and Rhythm: Normal rate.  Pulmonary:     Effort: Pulmonary effort is normal.  Neurological:     Mental Status: [name] alert.  Psychiatric:        Mood and Affect: Mood normal.        Behavior: Behavior normal.        Thought Content: Thought content normal.        Judgment: Judgment normal.      UC Treatments / Results  Labs (all labs ordered are listed, but only abnormal results are displayed) Labs Reviewed - No data to display  EKG   Radiology No results found.  Procedures Procedures (including critical care time)  Medications Ordered in UC Medications - No data to display  Initial Impression / Assessment and Plan / UC Course  I have reviewed the triage vital signs and the nursing notes.  Pertinent labs & imaging results that were available during my care of the patient were reviewed by me and considered in my medical decision making (see chart for details).     Lip swelling status post jewelry exchange. Was advised to continue with icing the site multiple times the day and was prescribed naproxen twice daily for her pain and swelling.  She reports she has taken naproxen in the past with no problems.  [name] unsure where there is an ibuprofen allergy listed in her chart.  I counseled her to follow-up with her primary care provider or back here at the urgent care if her symptoms worsen or fail to improve.  Return to work note provided today. Final Clinical Impressions(s) / UC Diagnoses   Final diagnoses:  Lip swelling     Discharge Instructions      I have sent some naproxen to your pharmacy for pain  and swelling, per your record he had this medication in the past.  Recommend continuing with ice for swelling and pain.  Follow-up if your symptoms worsen or fail to improve.     ED Prescriptions     Medication Sig Dispense Auth. Provider   naproxen (NAPROSYN) 500 MG tablet Take 1 tablet (500 mg total) by mouth 2 (two) times daily. 30 tablet Rodena Goldmann A, DO      PDMP not reviewed this encounter.   Elmore Guise, DO 04/06/22 [phone]

## 2022-04-17 ENCOUNTER — Other Ambulatory Visit: Payer: Self-pay

## 2022-04-17 ENCOUNTER — Emergency Department (HOSPITAL_COMMUNITY): Payer: Medicaid Other

## 2022-04-17 ENCOUNTER — Inpatient Hospital Stay (HOSPITAL_COMMUNITY)
Admission: EM | Admit: 2022-04-17 | Discharge: 2022-04-20 | DRG: 392 | Disposition: A | Discharge status: Home / Self Care | Payer: Medicaid Other | Attending: Internal Medicine | Admitting: Internal Medicine

## 2022-04-17 ENCOUNTER — Inpatient Hospital Stay (HOSPITAL_COMMUNITY): Payer: Medicaid Other

## 2022-04-17 ENCOUNTER — Encounter (HOSPITAL_COMMUNITY): Payer: Self-pay | Admitting: Family Medicine

## 2022-04-17 DIAGNOSIS — K529 Noninfective gastroenteritis and colitis, unspecified: Principal | ICD-10-CM | POA: Diagnosis present

## 2022-04-17 DIAGNOSIS — Z792 Long term (current) use of antibiotics: Secondary | ICD-10-CM

## 2022-04-17 DIAGNOSIS — F17210 Nicotine dependence, cigarettes, uncomplicated: Secondary | ICD-10-CM | POA: Diagnosis present

## 2022-04-17 DIAGNOSIS — Z79899 Other long term (current) drug therapy: Secondary | ICD-10-CM

## 2022-04-17 DIAGNOSIS — Z886 Allergy status to analgesic agent status: Secondary | ICD-10-CM | POA: Diagnosis not present

## 2022-04-17 DIAGNOSIS — F1290 Cannabis use, unspecified, uncomplicated: Secondary | ICD-10-CM | POA: Diagnosis present

## 2022-04-17 DIAGNOSIS — R112 Nausea with vomiting, unspecified: Secondary | ICD-10-CM

## 2022-04-17 DIAGNOSIS — Z975 Presence of (intrauterine) contraceptive device: Secondary | ICD-10-CM | POA: Diagnosis not present

## 2022-04-17 DIAGNOSIS — E876 Hypokalemia: Secondary | ICD-10-CM

## 2022-04-17 DIAGNOSIS — R1084 Generalized abdominal pain: Secondary | ICD-10-CM | POA: Diagnosis present

## 2022-04-17 DIAGNOSIS — Z6841 Body Mass Index (BMI) 40.0 and over, adult: Secondary | ICD-10-CM | POA: Diagnosis not present

## 2022-04-17 LAB — URINALYSIS, ROUTINE W REFLEX MICROSCOPIC
Bilirubin Urine: NEGATIVE
Glucose, UA: NEGATIVE mg/dL
Ketones, ur: 20 mg/dL — AB
Nitrite: NEGATIVE
Protein, ur: 30 mg/dL — AB
Specific Gravity, Urine: 1.011 (ref 1.005–1.030)
WBC, UA: >50 WBC/hpf (ref 0–5)
pH: 6 (ref 5.0–8.0)

## 2022-04-17 LAB — CBC
HCT: 40.6 % (ref 36.0–46.0)
HCT: 39.9 % (ref 36.0–46.0)
Hemoglobin: 13.2 g/dL (ref 12.0–15.0)
Hemoglobin: 13 g/dL (ref 12.0–15.0)
MCH: 26.8 pg (ref 26.0–34.0)
MCH: 26.4 pg (ref 26.0–34.0)
MCHC: 32.5 g/dL (ref 30.0–36.0)
MCHC: 32.6 g/dL (ref 30.0–36.0)
MCV: 82.4 fL (ref 80.0–100.0)
MCV: 81.1 fL (ref 80.0–100.0)
Platelets: 244 10*3/uL (ref 150–400)
Platelets: 239 10*3/uL (ref 150–400)
RBC: 4.93 MIL/uL (ref 3.87–5.11)
RBC: 4.92 MIL/uL (ref 3.87–5.11)
RDW: 14.4 % (ref 11.5–15.5)
RDW: 14.5 % (ref 11.5–15.5)
WBC: 22.4 10*3/uL — ABNORMAL HIGH (ref 4.0–10.5)
WBC: 18.5 10*3/uL — ABNORMAL HIGH (ref 4.0–10.5)
nRBC: 0 % (ref 0.0–0.2)
nRBC: 0 % (ref 0.0–0.2)

## 2022-04-17 LAB — COMPREHENSIVE METABOLIC PANEL
ALT: 11 U/L (ref 0–44)
AST: 12 U/L — ABNORMAL LOW (ref 15–41)
Albumin: 3.8 g/dL (ref 3.5–5.0)
Alkaline Phosphatase: 57 U/L (ref 38–126)
Anion gap: 11 (ref 5–15)
BUN: 10 mg/dL (ref 6–20)
CO2: 23 mmol/L (ref 22–32)
Calcium: 8.8 mg/dL — ABNORMAL LOW (ref 8.9–10.3)
Chloride: 101 mmol/L (ref 98–111)
Creatinine, Ser: 0.83 mg/dL (ref 0.44–1.00)
GFR, Estimated: >60 mL/min (ref >60)
Glucose, Bld: 116 mg/dL — ABNORMAL HIGH (ref 70–99)
Potassium: 2.7 mmol/L — CL (ref 3.5–5.1)
Sodium: 135 mmol/L (ref 135–145)
Total Bilirubin: 0.6 mg/dL (ref 0.3–1.2)
Total Protein: 8.2 g/dL — ABNORMAL HIGH (ref 6.5–8.1)

## 2022-04-17 LAB — I-STAT BETA HCG BLOOD, ED (MC, WL, AP ONLY): I-stat hCG, quantitative: <5 m[IU]/mL (ref <5)

## 2022-04-17 LAB — CREATININE, SERUM
Creatinine, Ser: 0.82 mg/dL (ref 0.44–1.00)
GFR, Estimated: >60 mL/min (ref >60)

## 2022-04-17 LAB — MAGNESIUM: Magnesium: 2 mg/dL (ref 1.7–2.4)

## 2022-04-17 LAB — LIPASE, BLOOD: Lipase: 24 U/L (ref 11–51)

## 2022-04-17 MED ORDER — CIPROFLOXACIN HCL 500 MG PO TABS: 500.0000 mg | ORAL_TABLET | Freq: Two times a day (BID) | ORAL | 0 refills | Status: DC

## 2022-04-17 MED ORDER — ONDANSETRON 4 MG PO TBDP
ORAL_TABLET | ORAL | Status: AC
  Filled 2022-04-17: qty 1

## 2022-04-17 MED ORDER — SODIUM CHLORIDE 0.9 % IV SOLN
12.5000 mg | Freq: Four times a day (QID) | INTRAVENOUS | Status: DC | PRN
  Administered 2022-04-18: 12.5 mg via INTRAVENOUS
  Filled 2022-04-17: qty 12.5

## 2022-04-17 MED ORDER — POTASSIUM CHLORIDE 10 MEQ/100ML IV SOLN
10.0000 meq | INTRAVENOUS | Status: AC
  Administered 2022-04-17 (×3): 10 meq via INTRAVENOUS
  Filled 2022-04-17 (×3): qty 100

## 2022-04-17 MED ORDER — POTASSIUM CHLORIDE IN NACL 20-0.9 MEQ/L-% IV SOLN
INTRAVENOUS | Status: DC
  Filled 2022-04-17: qty 1000

## 2022-04-17 MED ORDER — MORPHINE SULFATE (PF) 2 MG/ML IV SOLN
1.0000 mg | INTRAVENOUS | Status: DC | PRN
  Administered 2022-04-17 – 2022-04-18 (×3): 1 mg via INTRAVENOUS
  Filled 2022-04-17 (×3): qty 1

## 2022-04-17 MED ORDER — ACETAMINOPHEN 325 MG PO TABS
650.0000 mg | ORAL_TABLET | Freq: Four times a day (QID) | ORAL | Status: DC | PRN
  Administered 2022-04-18 – 2022-04-20 (×2): 650 mg via ORAL
  Filled 2022-04-17 (×3): qty 2

## 2022-04-17 MED ORDER — POTASSIUM CHLORIDE 10 MEQ/100ML IV SOLN
10.0000 meq | INTRAVENOUS | Status: AC
  Administered 2022-04-17 – 2022-04-18 (×5): 10 meq via INTRAVENOUS
  Filled 2022-04-17 (×4): qty 100

## 2022-04-17 MED ORDER — DROPERIDOL 2.5 MG/ML IJ SOLN
2.5000 mg | Freq: Once | INTRAMUSCULAR | Status: AC
  Administered 2022-04-17: 2.5 mg via INTRAVENOUS
  Filled 2022-04-17: qty 2

## 2022-04-17 MED ORDER — SENNOSIDES-DOCUSATE SODIUM 8.6-50 MG PO TABS: 1.0000 | ORAL_TABLET | Freq: Every evening | ORAL | Status: DC | PRN

## 2022-04-17 MED ORDER — ONDANSETRON HCL 4 MG PO TABS: 4.0000 mg | ORAL_TABLET | Freq: Four times a day (QID) | ORAL | 0 refills | Status: DC

## 2022-04-17 MED ORDER — ENOXAPARIN SODIUM 80 MG/0.8ML IJ SOSY
70.0000 mg | PREFILLED_SYRINGE | INTRAMUSCULAR | Status: DC
  Administered 2022-04-17 – 2022-04-19 (×3): 70 mg via SUBCUTANEOUS
  Filled 2022-04-17 (×2): qty 0.8
  Filled 2022-04-17: qty 0.7

## 2022-04-17 MED ORDER — IOHEXOL 300 MG/ML  SOLN
100.0000 mL | Freq: Once | INTRAMUSCULAR | Status: AC | PRN
  Administered 2022-04-17: 100 mL via INTRAVENOUS

## 2022-04-17 MED ORDER — ONDANSETRON 4 MG PO TBDP
4.0000 mg | ORAL_TABLET | Freq: Once | ORAL | Status: AC | PRN
  Administered 2022-04-17: 4 mg via ORAL

## 2022-04-17 MED ORDER — MORPHINE SULFATE (PF) 4 MG/ML IV SOLN
4.0000 mg | Freq: Once | INTRAVENOUS | Status: AC
  Administered 2022-04-17: 4 mg via INTRAVENOUS
  Filled 2022-04-17: qty 1

## 2022-04-17 MED ORDER — ONDANSETRON HCL 4 MG/2ML IJ SOLN
4.0000 mg | Freq: Four times a day (QID) | INTRAMUSCULAR | Status: DC
  Administered 2022-04-17 – 2022-04-20 (×11): 4 mg via INTRAVENOUS
  Filled 2022-04-17 (×12): qty 2

## 2022-04-17 MED ORDER — SODIUM CHLORIDE 0.9 % IV BOLUS
1000.0000 mL | Freq: Once | INTRAVENOUS | Status: AC
  Administered 2022-04-17: 1000 mL via INTRAVENOUS

## 2022-04-17 MED ORDER — ACETAMINOPHEN 650 MG RE SUPP: 650.0000 mg | Freq: Four times a day (QID) | RECTAL | Status: DC | PRN

## 2022-04-17 MED ORDER — HYDROMORPHONE HCL 1 MG/ML IJ SOLN
1.0000 mg | Freq: Once | INTRAMUSCULAR | Status: AC
  Administered 2022-04-17: 1 mg via INTRAVENOUS
  Filled 2022-04-17: qty 1

## 2022-04-17 NOTE — ED Provider Notes (Signed)
Care of patient handed off to me by Domenic Moras, PA-C at change of shift.  Briefly, 32 year old female who presents to the emergency department with abdominal pain and emesis.  She has a history of cannabinoid hyperemesis syndrome.  Has been having abdominal pain for 3 days.  Pain is in the mid to left abdomen.  Vomiting about 15 times a day.  Difficulty with eating and drinking fluids.  Apparently, did not fill previous medication she was prescribed for nausea.   Physical Exam  BP (!) 143/77 (BP Location: Left Arm)   Pulse (!) 55   Temp 98.6 F (37 C) (Oral)   Resp 20   Ht 5\' 6"  (1.676 m)   Wt 136.1 kg   SpO2 100%   BMI 48.42 kg/m   Physical Exam Vitals and nursing note reviewed.  HENT:     Head: Normocephalic and atraumatic.  Eyes:     General: No scleral icterus. Pulmonary:     Effort: Pulmonary effort is normal. No respiratory distress.  Skin:    Findings: No rash.  Neurological:     General: No focal deficit present.     Mental Status: [name] alert.  Psychiatric:        Mood and Affect: Mood normal.        Behavior: Behavior normal.        Thought Content: Thought content normal.        Judgment: Judgment normal.     Procedures  Procedures  ED Course / MDM   Clinical Course as of 04/17/22 1645  Mon Apr 17, 2022  1600 Hypokalemia, replacing IV [LA]    Clinical Course User Index [LA] Mickie Hillier, PA-C   Medical Decision Making Amount and/or Complexity of Data Reviewed Labs: ordered.  Risk Prescription drug management.   Workup was initiated here.  She has a white count of 22.4, elevated from previous. Previous provider ordered IV fluids, Zofran and droperidol  1645: pain has returned. Will dose with pain meds. Giving potassium replacement and will likely CT given significant increase in WBC from previous presentations.        Mickie Hillier, PA-C 04/17/22 [place], Ankit, MD 04/18/22 [phone]

## 2022-04-17 NOTE — ED Notes (Signed)
Unable to collect labs at this time patient want sit still

## 2022-04-17 NOTE — ED Notes (Signed)
PO challenge, passed. Pt denies nausea and vomiting

## 2022-04-17 NOTE — ED Notes (Signed)
Pt stated her stomach is "BURNING". Provider made aware.

## 2022-04-17 NOTE — ED Provider Notes (Signed)
[place] AT [facility] Provider Note   CSN: [id number] Arrival date & time: 04/17/22  [id number]     History  Chief Complaint  Patient presents with   Abdominal Pain   Emesis    Jacqueline Shepherd is a 32 y.o. female.  The history is provided by the patient and medical records. No language interpreter was used.  Abdominal Pain Associated symptoms: vomiting   Emesis Associated symptoms: abdominal pain      Patient is a 32 yo female with PMH of cannabis hyperemesis syndrome presenting from EMS with abdominal pain x 3 days. She says her pain is in her mid-abdomen and worst on the left side. She rates her pain a 10/10. She complains of vomiting x 2 days and says she has seen blood in it before. She reports that she has vomited 15 times today. She reports diarrhea on the first day of her symptoms but it has resolved since. No hematochezia. [name] struggling to eat and drink fluids. She says she was here a few weeks ago for similar symptoms. She reports she never filled the medications that she was given last time for nausea. She says she has not used any marijuana since her last visit. No alcohol, tobacco, or other drug use. She denies fever, chills, shortness of breath, chest pain, and urinary symptoms.   Home Medications Prior to Admission medications   Medication Sig Start Date End Date Taking? Authorizing Provider  buPROPion (WELLBUTRIN SR) 150 MG 12 hr tablet Take 1 tablet (150 mg total) by mouth at bedtime. 08/27/21 08/27/22  Bobbitt, Lennie Muckle, NP  buPROPion ER (WELLBUTRIN SR) 100 MG 12 hr tablet Take 1 tablet (100 mg total) by mouth daily. 08/27/21 08/27/22  Bobbitt, Lennie Muckle, NP  dicyclomine (BENTYL) 20 MG tablet Take 1 tablet (20 mg total) by mouth 3 (three) times daily before meals. 03/24/22   Sherwood Gambler, MD  metoCLOPramide (REGLAN) 10 MG tablet Take 1 tablet (10 mg total) by mouth every 8 (eight) hours as needed for nausea. 03/24/22   Sherwood Gambler, MD   metroNIDAZOLE (FLAGYL) 500 MG tablet Take 1 tablet (500 mg total) by mouth 2 (two) times daily. 01/05/22   Lamptey, Myrene Galas, MD  naproxen (NAPROSYN) 500 MG tablet Take 1 tablet (500 mg total) by mouth 2 (two) times daily. 04/06/22   Rodena Goldmann A, DO  ondansetron (ZOFRAN-ODT) 4 MG disintegrating tablet Take 1 tablet (4 mg total) by mouth every 8 (eight) hours as needed for nausea or vomiting. 03/24/22   Sherwood Gambler, MD  Potassium Chloride ER 20 MEQ TBCR Take 2 tablets (40 mEq total) by mouth daily. 03/23/22   Small, Brooke L, PA  promethazine (PHENERGAN) 25 MG suppository Place 1 suppository (25 mg total) rectally every 6 (six) hours as needed for nausea or vomiting. 03/23/22   Small, Brooke L, PA  fluticasone (FLONASE) 50 MCG/ACT nasal spray Place 1 spray into both nostrils daily. Patient not taking: Reported on 07/03/2019 04/18/19 07/03/19  Henderly, Britni A, PA-C      Allergies    Ibuprofen    Review of Systems   Review of Systems  Gastrointestinal:  Positive for abdominal pain and vomiting.  All other systems reviewed and are negative.   Physical Exam Updated Vital Signs BP (!) 156/101 (BP Location: Left Arm)   Pulse (!) 49   Temp 98.9 F (37.2 C)   Resp 18   SpO2 99%  Physical Exam Vitals and nursing note  reviewed.  Constitutional:      General: [name] not in acute distress.    Appearance: [name] well-developed. [name] obese.  HENT:     Head: Atraumatic.  Eyes:     Conjunctiva/sclera: Conjunctivae normal.  Cardiovascular:     Rate and Rhythm: Regular rhythm. Bradycardia present.     Heart sounds: Normal heart sounds.  Pulmonary:     Effort: Pulmonary effort is normal.     Breath sounds: No wheezing, rhonchi or rales.  Abdominal:     Palpations: Abdomen is soft.     Tenderness: There is generalized abdominal tenderness.  Musculoskeletal:     Cervical back: Neck supple.  Skin:    Findings: No rash.  Neurological:     Mental Status: [name] alert.   Psychiatric:        Mood and Affect: Mood normal.     ED Results / Procedures / Treatments   Labs (all labs ordered are listed, but only abnormal results are displayed) Labs Reviewed  CBC - Abnormal; Notable for the following components:      Result Value   WBC 22.4 (*)    All other components within normal limits  LIPASE, BLOOD  COMPREHENSIVE METABOLIC PANEL  URINALYSIS, ROUTINE W REFLEX MICROSCOPIC  I-STAT BETA HCG BLOOD, ED ([facility], [facility], [facility] ONLY)    EKG None  Radiology No results found.  Procedures Procedures    Medications Ordered in ED Medications  ondansetron (ZOFRAN-ODT) 4 MG disintegrating tablet (0 mg  Hold 04/17/22 1423)  ondansetron (ZOFRAN-ODT) disintegrating tablet 4 mg (4 mg Oral Given 04/17/22 1005)  droperidol (INAPSINE) 2.5 MG/ML injection 2.5 mg (2.5 mg Intravenous Given 04/17/22 1416)  sodium chloride 0.9 % bolus 1,000 mL (1,000 mLs Intravenous New Bag/Given 04/17/22 1417)    ED Course/ Medical Decision Making/ A&P                             Medical Decision Making Amount and/or Complexity of Data Reviewed Labs: ordered.  Risk Prescription drug management.   BP (!) 156/101 (BP Location: Left Arm)   Pulse (!) 49   Temp 98.9 F (37.2 C)   Resp 18   SpO2 99%   1:54 PM This is a 32 year old female significant history of cannabinol hyperemesis syndrome, frequent ER visit for abdominal pain, obesity, gestational diabetes, brought here via EMS with complaint of abdominal pain.  Patient endorsed diffuse abdominal discomfort ongoing for the past 2 to 3 days.  She also endorsed nausea and vomiting.  She states she feels weak and exhausted.  She does not endorse any fever or chills no runny nose sneezing or coughing no chest pain or shortness of breath no urinary symptoms.  [name] unsure of her last menstruation as she is on the injection.  She denies any recent marijuana use and states she quits using since the last time she was seen in the ER.  She denies  any alcohol abuse.  Her pain is moderate to severe.  Upon entering the room, patient is resting in bed appears to be in no acute discomfort however after obtaining history, patient became increasingly more uncomfortable, moaning and appears to be in some discomfort.  Heart with normal rate and rhythm, lungs clear to auscultation bilaterally abdomen is diffusely tender even with very minimal palpation but otherwise it is soft and nondistended.  [name] able to move all 4 extremities.  She has normal skin  turgor.  Vital signs notable for elevated blood pressure 156/101.  Evidence of bradycardia with a heart rate of 49.  [name] afebrile no hypoxia.  EMR review, patient has been seen multiple times for similar presentation likely in the setting of cannabinoid hyperemesis syndrome.  At this time I have low suspicion for acute emergent abdominal pathology.  Will provide supportive care.  3:24 PM Pt sign out to oncoming provider who will reevaluate pt after initial treatment with droperidol and zofran along with IVF.  If no improvement, pt may benefit from further imaging including abd/pelvis CT.  However, her previous CT scans have not shown any concerning findings.          Final Clinical Impression(s) / ED Diagnoses Final diagnoses:  None    Rx / DC Orders ED Discharge Orders     None         Domenic Moras, PA-C 04/17/22 1525    Cristie Hem, MD 04/18/22 1354

## 2022-04-17 NOTE — ED Notes (Signed)
Pt is laying in the floor in the lobby. Pt stated to me she was going to find a place to lay down.

## 2022-04-17 NOTE — H&P (Signed)
PCP:   Pcp, No   Chief Complaint:  Intractable nausea vomiting  HPI: This is a 32 year old female with past medical history of morbid obesity, cannabinoid hyperemesis syndrome.  Yes around 3 AM she was having a BM, which led to her developing nausea vomiting.  Her stomach started hurting and it has not stopped since.  She initially had a bit of diarrhea that has since resolved.  Her abdominal pain is generalized but greatest on the left.  Patient has recurrence of nausea and vomiting due to [facility] usage.  Per patient this is her typical presentation.  She denies fevers or chills, burning urination, shortness of breath or cough.  She denies hematemesis.  Per patient she has not been using THC.  In the ER patient received IV Zofran, droperidol, IV fluids, IV morphine IV Dilaudid without improvement.  She went on to fail a p.o. challenge.  Patient's WBCs elevated at 22.  Potassium decreased at 2.7 CT abdomen pelvis reads: Mild bowel wall thickening and surrounding fat stranding and point within the sigmoid colon and bowel wall thickening and hyperemia in the distal ileum, suggestive of infectious or inflammatory enterocolitis/colitis.  Also she had fat stranding about the left ovary which may be infectious or related to local inflammatory changes.  Patient given IV antibiotics admission requested  Review of Systems:  The patient denies fever, weight loss,, vision loss, decreased hearing, hoarseness, chest pain, syncope, dyspnea on exertion, peripheral edema, balance deficits, hemoptysis,  melena, hematochezia, severe indigestion/heartburn, hematuria, incontinence, genital sores, muscle weakness, suspicious skin lesions, transient blindness, difficulty walking, depression, unusual weight change, abnormal bleeding, enlarged lymph nodes, angioedema, and breast masses. Positive: Nausea, vomiting, abdominal pain  Past Medical History: Past Medical History:  Diagnosis Date   Abdominal pain    Cannabinoid  hyperemesis syndrome    Chlamydia infection complicating pregnancy in second trimester 08/18/2018   Negative test of cure on 08/06/18   Transient hypertension of pregnancy 10/12/2019   Weight loss 10/12/2019   Past Surgical History:  Procedure Laterality Date   CESAREAN SECTION N/A 05/14/2020   Procedure: CESAREAN SECTION;  Surgeon: Sloan Leiter, MD;  Location: [facility] LD ORS;  Service: Obstetrics;  Laterality: N/A;  Primary C/S malpresentation & IUD placement   WISDOM TOOTH EXTRACTION      Medications: Prior to Admission medications   Medication Sig Start Date End Date Taking? Authorizing Provider  ciprofloxacin (CIPRO) 500 MG tablet Take 1 tablet (500 mg total) by mouth every 12 (twelve) hours for 5 days. 04/17/22 04/22/22 Yes Mickie Hillier, PA-C  ondansetron (ZOFRAN) 4 MG tablet Take 1 tablet (4 mg total) by mouth every 6 (six) hours. 04/17/22  Yes Mickie Hillier, PA-C  buPROPion (WELLBUTRIN SR) 150 MG 12 hr tablet Take 1 tablet (150 mg total) by mouth at bedtime. 08/27/21 08/27/22  Bobbitt, Lennie Muckle, NP  buPROPion ER (WELLBUTRIN SR) 100 MG 12 hr tablet Take 1 tablet (100 mg total) by mouth daily. 08/27/21 08/27/22  Bobbitt, Lennie Muckle, NP  dicyclomine (BENTYL) 20 MG tablet Take 1 tablet (20 mg total) by mouth 3 (three) times daily before meals. 03/24/22   Sherwood Gambler, MD  metoCLOPramide (REGLAN) 10 MG tablet Take 1 tablet (10 mg total) by mouth every 8 (eight) hours as needed for nausea. 03/24/22   Sherwood Gambler, MD  metroNIDAZOLE (FLAGYL) 500 MG tablet Take 1 tablet (500 mg total) by mouth 2 (two) times daily. 01/05/22   Chase Picket, MD  naproxen (NAPROSYN) 500 MG tablet  Take 1 tablet (500 mg total) by mouth 2 (two) times daily. 04/06/22   Rodena Goldmann A, DO  ondansetron (ZOFRAN-ODT) 4 MG disintegrating tablet Take 1 tablet (4 mg total) by mouth every 8 (eight) hours as needed for nausea or vomiting. 03/24/22   Sherwood Gambler, MD  Potassium Chloride ER 20 MEQ TBCR Take 2 tablets (40 mEq  total) by mouth daily. 03/23/22   Small, Brooke L, PA  promethazine (PHENERGAN) 25 MG suppository Place 1 suppository (25 mg total) rectally every 6 (six) hours as needed for nausea or vomiting. 03/23/22   Small, Brooke L, PA  fluticasone (FLONASE) 50 MCG/ACT nasal spray Place 1 spray into both nostrils daily. Patient not taking: Reported on 07/03/2019 04/18/19 07/03/19  Henderly, Britni A, PA-C    Allergies:   Allergies  Allergen Reactions   Ibuprofen Anaphylaxis    Social History:  reports that she has been smoking cigarettes. She has been smoking an average of .25 packs per day. She has never used smokeless tobacco. She reports current drug use. Drug: Marijuana. She reports that she does not drink alcohol.  Family History: Family History  Problem Relation Age of Onset   Diabetes Mother    Hypertension Mother     Physical Exam: Vitals:   04/17/22 1420 04/17/22 1420 04/17/22 1800 04/17/22 2100  BP: (!) 143/77  (!) 141/90 131/85  Pulse: (!) 55  85 80  Resp: 20  (!) 24 20  Temp:  98.6 F (37 C) 98.6 F (37 C) 98.6 F (37 C)  TempSrc:  Oral    SpO2: 100%  100% 95%  Weight:      Height:        General:  Alert and oriented times three, well developed and nourished, no acute distress Eyes: PERRLA, pink conjunctiva, no scleral icterus ENT: Moist oral mucosa, neck supple, no thyromegaly Lungs: clear to ascultation, no wheeze, no crackles, no use of accessory muscles Cardiovascular: regular rate and rhythm, no regurgitation, no gallops, no murmurs. No carotid bruits, no JVD Abdomen: soft, positive BS,nonspecific generalized TTP, no organomegaly, not an acute abdomen GU: not examined Neuro: CN II - XII grossly intact, sensation intact Musculoskeletal: strength 5/5 all extremities, no clubbing, cyanosis or edema Skin: no rash, no subcutaneous crepitation, no decubitus Psych: appropriate patient   Labs on Admission:  Recent Labs    04/17/22 1004  NA 135  K 2.7*  CL 101  CO2  23  GLUCOSE 116*  BUN 10  CREATININE 0.83  CALCIUM 8.8*   Recent Labs    04/17/22 1004  AST 12*  ALT 11  ALKPHOS 57  BILITOT 0.6  PROT 8.2*  ALBUMIN 3.8   Recent Labs    04/17/22 1004  LIPASE 24   Recent Labs    04/17/22 1004  WBC 22.4*  HGB 13.2  HCT 40.6  MCV 82.4  PLT 244     Radiological Exams on Admission: CT Abdomen Pelvis W Contrast  Result Date: 04/17/2022 CLINICAL DATA:  Abdominal pain for 3 days. Pain is mid abdomen, worst on left side. EXAM: CT ABDOMEN AND PELVIS WITH CONTRAST TECHNIQUE: Multidetector CT imaging of the abdomen and pelvis was performed using the standard protocol following bolus administration of intravenous contrast. RADIATION DOSE REDUCTION: This exam was performed according to the departmental dose-optimization program which includes automated exposure control, adjustment of the mA and/or kV according to patient size and/or use of iterative reconstruction technique. CONTRAST:  160mL OMNIPAQUE IOHEXOL 300 MG/ML  SOLN COMPARISON:  03/23/2022. FINDINGS: Lower chest: The heart is mildly enlarged. The lung bases are clear. Hepatobiliary: No focal liver abnormality is seen. Layering hyperdense material is present in the gallbladder. No biliary ductal dilatation. Pancreas: Unremarkable. No pancreatic ductal dilatation or surrounding inflammatory changes. Spleen: Normal in size without focal abnormality. Adrenals/Urinary Tract: No adrenal nodule or mass. The kidneys enhance symmetrically. No renal calculus or hydronephrosis. The bladder is unremarkable. Stomach/Bowel: Stomach is within normal limits. Appendix appears normal. There is mild thickening of the walls of the sigmoid colon with surrounding fat stranding. There is thickening of the walls of the distal ileum with associated mesenteric hyperemia. No free air or pneumatosis. Vascular/Lymphatic: No significant vascular findings are present. No enlarged abdominal or pelvic lymph nodes. Reproductive: An IUD  is present in the uterus. Mild fat stranding is noted about the left ovary. Other: A trace amount of free fluid is noted in the cul-de-sac and likely physiologic. A small fat containing umbilical hernia is present. Musculoskeletal: No acute or suspicious osseous abnormality. IMPRESSION: 1. Mild bowel wall thickening and surrounding fat stranding involving the sigmoid colon and bowel wall thickening and hyperemia of the distal ileum, suggesting infectious or inflammatory enteritis/colitis. 2. Fat stranding about the left ovary, which may be infectious or related to local inflammatory changes. Ultrasound is suggested for further evaluation. Electronically Signed   By: Brett Fairy M.D.   On: 04/17/2022 20:42    Assessment/Plan Present on Admission:  Intractable nausea and vomiting -DDx: Per workup includes colitis, ovarian infection, possible UTI, cannabinoid hyperemesis syndrome, cyclic vomiting -Transvaginal ultrasound ordered/negative and -UDS ordered, pending -Blood cultures and UA ordered. -Given patient's leukocytosis I will start IV antibiotics.  Leukocytosis could be due to  d/t stress demargination or be reactive in nature   Hypokalemia -Repleting IV.  Magnesium level ordered. -BMP in a.m.  Carlissa Pesola 04/17/2022, 10:10 PM

## 2022-04-17 NOTE — ED Triage Notes (Signed)
Pt arrives via [facility] c/o lower abdominal pain and N/V for two days.

## 2022-04-17 NOTE — Discharge Instructions (Addendum)
You were seen

## 2022-04-18 ENCOUNTER — Inpatient Hospital Stay (HOSPITAL_COMMUNITY): Payer: Medicaid Other

## 2022-04-18 DIAGNOSIS — R112 Nausea with vomiting, unspecified: Secondary | ICD-10-CM | POA: Diagnosis not present

## 2022-04-18 LAB — CBC WITH DIFFERENTIAL/PLATELET
Abs Immature Granulocytes: 0.05 10*3/uL (ref 0.00–0.07)
Basophils Absolute: 0 10*3/uL (ref 0.0–0.1)
Basophils Relative: 0 %
Eosinophils Absolute: 0 10*3/uL (ref 0.0–0.5)
Eosinophils Relative: 0 %
HCT: 43.3 % (ref 36.0–46.0)
Hemoglobin: 13.9 g/dL (ref 12.0–15.0)
Immature Granulocytes: 0 %
Lymphocytes Relative: 12 %
Lymphs Abs: 1.5 10*3/uL (ref 0.7–4.0)
MCH: 26.5 pg (ref 26.0–34.0)
MCHC: 32.1 g/dL (ref 30.0–36.0)
MCV: 82.5 fL (ref 80.0–100.0)
Monocytes Absolute: 0.7 10*3/uL (ref 0.1–1.0)
Monocytes Relative: 5 %
Neutro Abs: 10.7 10*3/uL — ABNORMAL HIGH (ref 1.7–7.7)
Neutrophils Relative %: 83 %
Platelets: 230 10*3/uL (ref 150–400)
RBC: 5.25 MIL/uL — ABNORMAL HIGH (ref 3.87–5.11)
RDW: 14.5 % (ref 11.5–15.5)
WBC: 12.9 10*3/uL — ABNORMAL HIGH (ref 4.0–10.5)
nRBC: 0 % (ref 0.0–0.2)

## 2022-04-18 LAB — RAPID URINE DRUG SCREEN, HOSP PERFORMED
Amphetamines: NOT DETECTED
Barbiturates: NOT DETECTED
Benzodiazepines: NOT DETECTED
Cocaine: NOT DETECTED
Opiates: POSITIVE — AB
Tetrahydrocannabinol: POSITIVE — AB

## 2022-04-18 LAB — BASIC METABOLIC PANEL
Anion gap: 10 (ref 5–15)
BUN: 8 mg/dL (ref 6–20)
CO2: 22 mmol/L (ref 22–32)
Calcium: 8.6 mg/dL — ABNORMAL LOW (ref 8.9–10.3)
Chloride: 104 mmol/L (ref 98–111)
Creatinine, Ser: 0.73 mg/dL (ref 0.44–1.00)
GFR, Estimated: >60 mL/min (ref >60)
Glucose, Bld: 129 mg/dL — ABNORMAL HIGH (ref 70–99)
Potassium: 4.1 mmol/L (ref 3.5–5.1)
Sodium: 136 mmol/L (ref 135–145)

## 2022-04-18 LAB — URINE CULTURE: Culture: <10000 — AB

## 2022-04-18 LAB — MAGNESIUM: Magnesium: 1.9 mg/dL (ref 1.7–2.4)

## 2022-04-18 MED ORDER — SODIUM CHLORIDE 0.9 % IV SOLN: INTRAVENOUS | Status: DC

## 2022-04-18 MED ORDER — SENNOSIDES-DOCUSATE SODIUM 8.6-50 MG PO TABS
1.0000 | ORAL_TABLET | Freq: Two times a day (BID) | ORAL | Status: DC
  Administered 2022-04-18: 1 via ORAL
  Filled 2022-04-18 (×2): qty 1

## 2022-04-18 MED ORDER — PANTOPRAZOLE SODIUM 40 MG IV SOLR: 40.0000 mg | Freq: Once | INTRAVENOUS | Status: DC

## 2022-04-18 MED ORDER — METOCLOPRAMIDE HCL 5 MG/ML IJ SOLN: 10.0000 mg | Freq: Three times a day (TID) | INTRAMUSCULAR | Status: DC | PRN

## 2022-04-18 MED ORDER — HYDROMORPHONE HCL 1 MG/ML IJ SOLN
1.0000 mg | INTRAMUSCULAR | Status: DC | PRN
  Administered 2022-04-18 – 2022-04-19 (×6): 1 mg via INTRAVENOUS
  Filled 2022-04-18 (×6): qty 1

## 2022-04-18 MED ORDER — PIPERACILLIN-TAZOBACTAM 3.375 G IVPB
3.3750 g | Freq: Three times a day (TID) | INTRAVENOUS | Status: DC
  Administered 2022-04-18 – 2022-04-20 (×7): 3.375 g via INTRAVENOUS
  Filled 2022-04-18 (×8): qty 50

## 2022-04-18 MED ORDER — POLYETHYLENE GLYCOL 3350 17 G PO PACK
17.0000 g | PACK | Freq: Every day | ORAL | Status: DC
  Administered 2022-04-18: 17 g via ORAL
  Filled 2022-04-18: qty 1

## 2022-04-18 NOTE — Progress Notes (Signed)
PROGRESS NOTE  Jacqueline Shepherd  [id number] DOB: 1990/04/14 DOA: 04/17/2022 PCP: Pcp, No   Brief Narrative:  Patient is a 32 year old female with history of morbid obesity, cannabinoid hyperemesis syndrome who presented here with complaint of intractable nausea, vomiting, abdominal pain.  History of recurrent nausea and vomiting secondary to marijuana use.  This time, patient reported that she was not using marijuana.  On presentation, lab work showed WBC count of 22, potassium of 2.7.  CT abdomen/pelvis showed mild bowel wall thickening and surrounding fat stranding within the sigmoid colon, bowel wall thickening and hyperemia in the distal ileum suggestive of infectious or inflammatory enterocolitis/colitis.  Patient was started on IV antibiotics, antiemetics and IV fluids.  Assessment & Plan:  Principal Problem:   Intractable nausea and vomiting Active Problems:   Cannabinoid hyperemesis syndrome   Hypokalemia   Enteritis/colitis: Presented with nausea, vomiting, abdominal pain.  CT abdomen/pelvis showed features of ileitis/sigmoid colitis.  Presented with leukocytosis. Continue Zosyn, follow-up cultures, will try to get GI pathogen panel if she has a bowel movement. Leukocytosis improving with antibiotics. Complain of severe abdominal pain with tossing and turning on bed.  Said that she did not have a bowel movement for last several days.  Her abdomen was soft, nontender, nondistended .  Abdominal x-ray was negative for obstructive findings. Continue fluid diet for today.  Will slowly advance  Intractable nausea and vomiting: Continue antiemetics, IV fluids.  Currently on full liquid diet.  History of marijuana induced hyperemesis syndrome but currently she denies taking it.  Advance diet as tolerated.  Hypokalemia: Currently being supplemented and monitored  Morbid obesity: BMI of 48.4       DVT prophylaxis:     Code Status: Full Code  Family Communication: None at  bedside  Patient status:Inpatient  Patient is from :home  Anticipated discharge [id number]  Estimated DC date: Tomorrow after improvement in the abdominal pain   Consultants: None  Procedures: None  Antimicrobials:  Anti-infectives (From admission, onward)    Start     Dose/Rate Route Frequency Ordered Stop   04/18/22 0400  piperacillin-tazobactam (ZOSYN) IVPB 3.375 g        3.375 g 12.5 mL/hr over 240 Minutes Intravenous Every 8 hours 04/18/22 0304     04/17/22 0000  ciprofloxacin (CIPRO) 500 MG tablet        500 mg Oral Every 12 hours 04/17/22 2116 04/22/22 2359       Subjective: Patient seen and examined at bedside today.  Hemodynamically stable.  Very uncomfortable and was complaining of severe pain on presentation.  Abdomen was benign on examination  Objective: Vitals:   04/17/22 2341 04/17/22 2350 04/18/22 0331 04/18/22 0753  BP: (!) 167/133 (!) 150/98 (!) 152/90 131/89  Pulse: (!) 57  (!) 59 61  Resp: 18  17 18   Temp: (!) 97.4 F (36.3 C)  98.5 F (36.9 C) 98.5 F (36.9 C)  TempSrc: Oral  Oral Oral  SpO2: 100%  95% 100%  Weight:      Height:       No intake or output data in the 24 hours ending 04/18/22 0807 Filed Weights   04/17/22 1419  Weight: 136.1 kg    Examination:  General exam: Overall comfortable, not in distress, morbidly obese HEENT: PERRL Respiratory system:  no wheezes or crackles  Cardiovascular system: S1 & S2 heard, RRR.  Gastrointestinal system: Abdomen is nondistended, soft and nontender. Central nervous system: Alert and oriented Extremities: No edema, no clubbing ,no  cyanosis Skin: No rashes, no ulcers,no icterus     Data Reviewed: I have personally reviewed following labs and imaging studies  CBC: Recent Labs  Lab 04/17/22 1004 04/17/22 2241 04/18/22 0532  WBC 22.4* 18.5* 12.9*  NEUTROABS  --   --  10.7*  HGB 13.2 13.0 13.9  HCT 40.6 39.9 43.3  MCV 82.4 81.1 82.5  PLT 244 239 [id number]   Basic Metabolic Panel: Recent  Labs  Lab 04/17/22 1004 04/17/22 2241 04/18/22 0122  NA 135  --  136  K 2.7*  --  4.1  CL 101  --  104  CO2 23  --  22  GLUCOSE 116*  --  129*  BUN 10  --  8  CREATININE 0.83 0.82 0.73  CALCIUM 8.8*  --  8.6*  MG  --  2.0 1.9     No results found for this or any previous visit (from the past 240 hour(s)).   Radiology Studies: US PELVIC COMPLETE W TRANSVAGINAL AND TORSION R/O  Result Date: 04/17/2022 CLINICAL DATA:  Left abdominal pain EXAM: TRANSABDOMINAL AND TRANSVAGINAL ULTRASOUND OF PELVIS DOPPLER ULTRASOUND OF OVARIES TECHNIQUE: Both transabdominal and transvaginal ultrasound examinations of the pelvis were performed. Transabdominal technique was performed for global imaging of the pelvis including uterus, ovaries, adnexal regions, and pelvic cul-de-sac. It was necessary to proceed with endovaginal exam following the transabdominal exam to visualize the uterus endometrium ovaries. Color and duplex Doppler ultrasound was utilized to evaluate blood flow to the ovaries. COMPARISON:  CT 04/17/2022 FINDINGS: Uterus Measurements: 7.7 x 3.6 x 4.9 cm = volume: 72.1 mL. No fibroids or other mass visualized. Endometrium Thickness: 2.7 mm.  IUD in the upper uterine segment. Right ovary Measurements: 2.5 x 1.8 x 1.8 cm = volume: 4.2 mL. Normal appearance/no adnexal mass. Left ovary Measurements: 3.3 x 2.1 x 2.6 cm = volume: 9.3 mL. Normal appearance/no adnexal mass. Pulsed Doppler evaluation of both ovaries demonstrates normal low-resistance arterial and venous waveforms. Other findings Trace free fluid IMPRESSION: 1. Limited by habitus. No definitive evidence for torsion or ovarian mass lesion. 2. IUD appears grossly appropriate in position by sonography. 3. Trace free fluid in the pelvis. Electronically Signed   By: Donavan Foil M.D.   On: 04/17/2022 23:26   CT Abdomen Pelvis W Contrast  Result Date: 04/17/2022 CLINICAL DATA:  Abdominal pain for 3 days. Pain is mid abdomen, worst on left side.  EXAM: CT ABDOMEN AND PELVIS WITH CONTRAST TECHNIQUE: Multidetector CT imaging of the abdomen and pelvis was performed using the standard protocol following bolus administration of intravenous contrast. RADIATION DOSE REDUCTION: This exam was performed according to the departmental dose-optimization program which includes automated exposure control, adjustment of the mA and/or kV according to patient size and/or use of iterative reconstruction technique. CONTRAST:  136mL OMNIPAQUE IOHEXOL 300 MG/ML  SOLN COMPARISON:  03/23/2022. FINDINGS: Lower chest: The heart is mildly enlarged. The lung bases are clear. Hepatobiliary: No focal liver abnormality is seen. Layering hyperdense material is present in the gallbladder. No biliary ductal dilatation. Pancreas: Unremarkable. No pancreatic ductal dilatation or surrounding inflammatory changes. Spleen: Normal in size without focal abnormality. Adrenals/Urinary Tract: No adrenal nodule or mass. The kidneys enhance symmetrically. No renal calculus or hydronephrosis. The bladder is unremarkable. Stomach/Bowel: Stomach is within normal limits. Appendix appears normal. There is mild thickening of the walls of the sigmoid colon with surrounding fat stranding. There is thickening of the walls of the distal ileum with associated mesenteric hyperemia. No free air  or pneumatosis. Vascular/Lymphatic: No significant vascular findings are present. No enlarged abdominal or pelvic lymph nodes. Reproductive: An IUD is present in the uterus. Mild fat stranding is noted about the left ovary. Other: A trace amount of free fluid is noted in the cul-de-sac and likely physiologic. A small fat containing umbilical hernia is present. Musculoskeletal: No acute or suspicious osseous abnormality. IMPRESSION: 1. Mild bowel wall thickening and surrounding fat stranding involving the sigmoid colon and bowel wall thickening and hyperemia of the distal ileum, suggesting infectious or inflammatory  enteritis/colitis. 2. Fat stranding about the left ovary, which may be infectious or related to local inflammatory changes. Ultrasound is suggested for further evaluation. Electronically Signed   By: Brett Fairy M.D.   On: 04/17/2022 20:42    Scheduled Meds:  enoxaparin (LOVENOX) injection  70 mg Subcutaneous Q24H   ondansetron (ZOFRAN) IV  4 mg Intravenous Q6H   Continuous Infusions:  sodium chloride 100 mL/hr at 04/18/22 0325   piperacillin-tazobactam (ZOSYN)  IV 3.375 g (04/18/22 0435)   promethazine (PHENERGAN) injection (IM or IVPB) 12.5 mg (04/18/22 [id number])     LOS: 1 day   Shelly Coss, MD [facility] P3/26/2024, 8:07 AM

## 2022-04-18 NOTE — Progress Notes (Signed)
  Transition of Care [facility]) Screening Note   Patient Details  Name: Jacqueline Shepherd Date of Birth: January 23, 1991   Transition of Care [facility]) CM/SW Contact:    Vassie Moselle, LCSW Phone Number: 04/18/2022, 12:37 PM    Transition of Care Department [facility]) has reviewed patient and no TOC needs have been identified at this time. We will continue to monitor patient advancement through interdisciplinary progression rounds. If new patient transition needs arise, please place a TOC consult.

## 2022-04-18 NOTE — Progress Notes (Signed)
Pharmacy Antibiotic Note  Jacqueline Shepherd is a 32 y.o. female admitted on 04/17/2022 with intra-abdominal infection.  Pharmacy has been consulted for Zosyn dosing.  Plan: Zosyn 3.375g IV q8h (4 hour infusion). Need for further dosage adjustment appears unlikely at present.    Will sign off at this time.  Please reconsult if a change in clinical status warrants re-evaluation of dosage.    Height: 5\' 6"  (167.6 cm) Weight: 136.1 kg (300 lb) IBW/kg (Calculated) : 59.3  Temp (24hrs), Avg:98.5 F (36.9 C), Min:97.4 F (36.3 C), Max:98.9 F (37.2 C)  Recent Labs  Lab 04/17/22 1004 04/17/22 2241 04/18/22 0122  WBC 22.4* 18.5*  --   CREATININE 0.83 0.82 0.73    Estimated Creatinine Clearance: 144.8 mL/min (by C-G formula based on SCr of 0.73 mg/dL).    Allergies  Allergen Reactions   Ibuprofen Anaphylaxis      Thank you for allowing pharmacy to be a part of this patient's care.  Everette Rank, PharmD 04/18/2022 3:04 AM

## 2022-04-19 DIAGNOSIS — R112 Nausea with vomiting, unspecified: Secondary | ICD-10-CM | POA: Diagnosis not present

## 2022-04-19 LAB — BASIC METABOLIC PANEL
Anion gap: 9 (ref 5–15)
BUN: 7 mg/dL (ref 6–20)
CO2: 23 mmol/L (ref 22–32)
Calcium: 8.2 mg/dL — ABNORMAL LOW (ref 8.9–10.3)
Chloride: 104 mmol/L (ref 98–111)
Creatinine, Ser: 0.86 mg/dL (ref 0.44–1.00)
GFR, Estimated: >60 mL/min (ref >60)
Glucose, Bld: 96 mg/dL (ref 70–99)
Potassium: 3.2 mmol/L — ABNORMAL LOW (ref 3.5–5.1)
Sodium: 136 mmol/L (ref 135–145)

## 2022-04-19 MED ORDER — POTASSIUM CHLORIDE 10 MEQ/100ML IV SOLN
10.0000 meq | INTRAVENOUS | Status: AC
  Administered 2022-04-19 (×4): 10 meq via INTRAVENOUS
  Filled 2022-04-19 (×4): qty 100

## 2022-04-19 MED ORDER — HYDROMORPHONE HCL 1 MG/ML IJ SOLN
0.5000 mg | INTRAMUSCULAR | Status: DC | PRN
  Administered 2022-04-19 – 2022-04-20 (×5): 0.5 mg via INTRAVENOUS
  Filled 2022-04-19 (×6): qty 0.5

## 2022-04-19 MED ORDER — MELATONIN 3 MG PO TABS
6.0000 mg | ORAL_TABLET | Freq: Every evening | ORAL | Status: DC | PRN
  Administered 2022-04-19: 6 mg via ORAL
  Filled 2022-04-19: qty 2

## 2022-04-19 NOTE — Plan of Care (Signed)

## 2022-04-19 NOTE — Progress Notes (Signed)
PROGRESS NOTE  Jacqueline Shepherd  [id number] DOB: 21-Jan-1991 DOA: 04/17/2022 PCP: Pcp, No   Brief Narrative:  Patient is a 32 year old female with history of morbid obesity, cannabinoid hyperemesis syndrome who presented here with complaint of intractable nausea, vomiting, abdominal pain.  History of recurrent nausea and vomiting secondary to marijuana use.  This time, patient reported that she was not using marijuana.  On presentation, lab work showed WBC count of 22, potassium of 2.7.  CT abdomen/pelvis showed mild bowel wall thickening and surrounding fat stranding within the sigmoid colon, bowel wall thickening and hyperemia in the distal ileum suggestive of infectious or inflammatory enterocolitis/colitis.  Patient was started on IV antibiotics, antiemetics and IV fluids.  Assessment & Plan:  Principal Problem:   Intractable nausea and vomiting Active Problems:   Cannabinoid hyperemesis syndrome   Hypokalemia   Enteritis/colitis: Presented with nausea, vomiting, abdominal pain.  CT abdomen/pelvis showed features of ileitis/sigmoid colitis.  Presented with leukocytosis. Continue Zosyn, follow-up cultures, we are trying to get GI pathogen panel if she has a bowel movement. Leukocytosis improving with antibiotics. Abdominal pain is better today but not entirely gone, wants to advance diet to soft.  Her abdomen was soft, nontender, nondistended .  Abdominal x-ray was negative for obstructive findings. Started soft diet with plan for further advancement  Intractable nausea and vomiting: Continue antiemetics, IV fluids.    History of marijuana induced hyperemesis syndrome but currently she denies taking it.  Advance diet as tolerated.Better  Hypokalemia: Supplemented   Morbid obesity: BMI of 48.4       DVT prophylaxis:Lovenox     Code Status: Full Code  Family Communication: None at bedside  Patient status:Inpatient  Patient is from :home  Anticipated discharge  [id number]  Estimated DC date: Tomorrow   Consultants: None  Procedures: None  Antimicrobials:  Anti-infectives (From admission, onward)    Start     Dose/Rate Route Frequency Ordered Stop   04/18/22 0400  piperacillin-tazobactam (ZOSYN) IVPB 3.375 g        3.375 g 12.5 mL/hr over 240 Minutes Intravenous Every 8 hours 04/18/22 0304     04/17/22 0000  ciprofloxacin (CIPRO) 500 MG tablet        500 mg Oral Every 12 hours 04/17/22 2116 04/22/22 2359       Subjective: Patient seen and examined at bedside today.  She feels better.  She still has some abdominal discomfort.  Had a very small bowel movement today.  No nausea or vomiting.  Wants to eat some solid food  Objective: Vitals:   04/18/22 1304 04/18/22 1919 04/18/22 2323 04/19/22 0440  BP: (!) 154/97 (!) 182/108 (!) 148/97 (!) 148/97  Pulse: 74 (!) 59 77 (!) 57  Resp: 17 18  17   Temp: 98.4 F (36.9 C) 97.9 F (36.6 C)  97.9 F (36.6 C)  TempSrc: Oral Oral  Oral  SpO2: 99% 100%  97%  Weight:      Height:        Intake/Output Summary (Last 24 hours) at 04/19/2022 1206 Last data filed at 04/19/2022 1022 Gross per 24 hour  Intake 2651.49 ml  Output 2 ml  Net 2649.49 ml   Filed Weights   04/17/22 1419  Weight: 136.1 kg    Examination:  General exam: Overall comfortable, not in distress, morbidly obese HEENT: PERRL Respiratory system:  no wheezes or crackles  Cardiovascular system: S1 & S2 heard, RRR.  Gastrointestinal system: Abdomen is nondistended, soft and nontender. Central nervous system:  Alert and oriented Extremities: No edema, no clubbing ,no cyanosis Skin: No rashes, no ulcers,no icterus       Data Reviewed: I have personally reviewed following labs and imaging studies  CBC: Recent Labs  Lab 04/17/22 1004 04/17/22 2241 04/18/22 0532  WBC 22.4* 18.5* 12.9*  NEUTROABS  --   --  10.7*  HGB 13.2 13.0 13.9  HCT 40.6 39.9 43.3  MCV 82.4 81.1 82.5  PLT 244 239 [id number]   Basic Metabolic  Panel: Recent Labs  Lab 04/17/22 1004 04/17/22 2241 04/18/22 0122 04/19/22 0622  NA 135  --  136 136  K 2.7*  --  4.1 3.2*  CL 101  --  104 104  CO2 23  --  22 23  GLUCOSE 116*  --  129* 96  BUN 10  --  8 7  CREATININE 0.83 0.82 0.73 0.86  CALCIUM 8.8*  --  8.6* 8.2*  MG  --  2.0 1.9  --      Recent Results (from the past 240 hour(s))  Urine Culture     Status: Abnormal   Collection Time: 04/17/22  8:35 PM   Specimen: Urine, Clean Catch  Result Value Ref Range Status   Specimen Description   Final    URINE, CLEAN CATCH Performed at [facility], [place] [address] [place], [place] [address]    Special Requests   Final    NONE Performed at [facility], [place] [address] [place], [place] [address]    Culture (A)  Final    <10,000 COLONIES/mL INSIGNIFICANT GROWTH Performed at [place] [address], [place], [place] [address]    Report Status 04/18/2022 FINAL  Final  Culture, blood (Routine X 2) w Reflex to ID Panel     Status: None (Preliminary result)   Collection Time: 04/18/22  7:30 AM   Specimen: BLOOD  Result Value Ref Range Status   Specimen Description   Final    BLOOD RIGHT ANTECUBITAL Performed at [place] [address] [place], [place] [address]    Special Requests   Final    AEROBIC BOTTLE ONLY Blood Culture adequate volume Performed at [place] [address] [place], [place] [address]    Culture   Final    NO GROWTH < 24 HOURS Performed at [place] [address], [place], [place] [address]    Report Status PENDING  Incomplete  Culture, blood (Routine X 2) w Reflex to ID Panel     Status: None (Preliminary result)   Collection Time: 04/18/22  7:30 AM   Specimen: BLOOD  Result Value Ref Range Status   Specimen Description   Final    BLOOD BLOOD RIGHT HAND Performed at [facility] [address], [place], [place]  [address]    Special Requests   Final    BOTTLES DRAWN AEROBIC ONLY Blood Culture results may not be optimal due to an inadequate volume of blood received in culture bottles Performed at [facility] [address] [place], [place] [address]    Culture   Final    NO GROWTH < 24 HOURS Performed at [facility] [address], [place], [place] [address]    Report Status PENDING  Incomplete     Radiology Studies: DG Abd 1 View  Result Date: 04/18/2022 CLINICAL DATA:  Abdominal pain. EXAM: ABDOMEN - 1 VIEW COMPARISON:  06/03/2020  FINDINGS: No gaseous bowel dilatation to suggest obstruction. Visualized bony anatomy unremarkable. IUD projects over the mid pelvis. IMPRESSION: Nonobstructive bowel gas pattern. Electronically Signed   By: Misty Stanley M.D.   On: 04/18/2022 10:44   US PELVIC COMPLETE W TRANSVAGINAL AND TORSION R/O  Result Date: 04/17/2022 CLINICAL DATA:  Left abdominal pain EXAM: TRANSABDOMINAL AND TRANSVAGINAL ULTRASOUND OF PELVIS DOPPLER ULTRASOUND OF OVARIES TECHNIQUE: Both transabdominal and transvaginal ultrasound examinations of the pelvis were performed. Transabdominal technique was performed for global imaging of the pelvis including uterus, ovaries, adnexal regions, and pelvic cul-de-sac. It was necessary to proceed with endovaginal exam following the transabdominal exam to visualize the uterus endometrium ovaries. Color and duplex Doppler ultrasound was utilized to evaluate blood flow to the ovaries. COMPARISON:  CT 04/17/2022 FINDINGS: Uterus Measurements: 7.7 x 3.6 x 4.9 cm = volume: 72.1 mL. No fibroids or other mass visualized. Endometrium Thickness: 2.7 mm.  IUD in the upper uterine segment. Right ovary Measurements: 2.5 x 1.8 x 1.8 cm = volume: 4.2 mL. Normal appearance/no adnexal mass. Left ovary Measurements: 3.3 x 2.1 x 2.6 cm = volume: 9.3 mL. Normal appearance/no adnexal mass. Pulsed Doppler evaluation of both ovaries demonstrates normal  low-resistance arterial and venous waveforms. Other findings Trace free fluid IMPRESSION: 1. Limited by habitus. No definitive evidence for torsion or ovarian mass lesion. 2. IUD appears grossly appropriate in position by sonography. 3. Trace free fluid in the pelvis. Electronically Signed   By: Donavan Foil M.D.   On: 04/17/2022 23:26   CT Abdomen Pelvis W Contrast  Result Date: 04/17/2022 CLINICAL DATA:  Abdominal pain for 3 days. Pain is mid abdomen, worst on left side. EXAM: CT ABDOMEN AND PELVIS WITH CONTRAST TECHNIQUE: Multidetector CT imaging of the abdomen and pelvis was performed using the standard protocol following bolus administration of intravenous contrast. RADIATION DOSE REDUCTION: This exam was performed according to the departmental dose-optimization program which includes automated exposure control, adjustment of the mA and/or kV according to patient size and/or use of iterative reconstruction technique. CONTRAST:  167mL OMNIPAQUE IOHEXOL 300 MG/ML  SOLN COMPARISON:  03/23/2022. FINDINGS: Lower chest: The heart is mildly enlarged. The lung bases are clear. Hepatobiliary: No focal liver abnormality is seen. Layering hyperdense material is present in the gallbladder. No biliary ductal dilatation. Pancreas: Unremarkable. No pancreatic ductal dilatation or surrounding inflammatory changes. Spleen: Normal in size without focal abnormality. Adrenals/Urinary Tract: No adrenal nodule or mass. The kidneys enhance symmetrically. No renal calculus or hydronephrosis. The bladder is unremarkable. Stomach/Bowel: Stomach is within normal limits. Appendix appears normal. There is mild thickening of the walls of the sigmoid colon with surrounding fat stranding. There is thickening of the walls of the distal ileum with associated mesenteric hyperemia. No free air or pneumatosis. Vascular/Lymphatic: No significant vascular findings are present. No enlarged abdominal or pelvic lymph nodes. Reproductive: An IUD is  present in the uterus. Mild fat stranding is noted about the left ovary. Other: A trace amount of free fluid is noted in the cul-de-sac and likely physiologic. A small fat containing umbilical hernia is present. Musculoskeletal: No acute or suspicious osseous abnormality. IMPRESSION: 1. Mild bowel wall thickening and surrounding fat stranding involving the sigmoid colon and bowel wall thickening and hyperemia of the distal ileum, suggesting infectious or inflammatory enteritis/colitis. 2. Fat stranding about the left ovary, which may be infectious or related to local inflammatory changes. Ultrasound is suggested for further evaluation. Electronically Signed   By: Brett Fairy M.D.   On:  04/17/2022 20:42    Scheduled Meds:  enoxaparin (LOVENOX) injection  70 mg Subcutaneous Q24H   ondansetron (ZOFRAN) IV  4 mg Intravenous Q6H   pantoprazole (PROTONIX) IV  40 mg Intravenous Once   polyethylene glycol  17 g Oral Daily   senna-docusate  1 tablet Oral BID   Continuous Infusions:  sodium chloride 100 mL/hr at 04/19/22 0152   piperacillin-tazobactam (ZOSYN)  IV 3.375 g (04/19/22 0503)   potassium chloride 10 mEq (04/19/22 1120)   promethazine (PHENERGAN) injection (IM or IVPB) Stopped (04/18/22 [id number])     LOS: 2 days   Shelly Coss, MD [facility] P3/27/2024, 12:06 PM

## 2022-04-19 NOTE — Progress Notes (Addendum)
Pt says she is starving and wants some noodle soup or few saltine crackers. Asked Olena Heckle, NP and he agreed with few crackers.

## 2022-04-20 DIAGNOSIS — R112 Nausea with vomiting, unspecified: Secondary | ICD-10-CM | POA: Diagnosis not present

## 2022-04-20 LAB — CBC
HCT: 36.5 % (ref 36.0–46.0)
Hemoglobin: 11.7 g/dL — ABNORMAL LOW (ref 12.0–15.0)
MCH: 26.6 pg (ref 26.0–34.0)
MCHC: 32.1 g/dL (ref 30.0–36.0)
MCV: 83 fL (ref 80.0–100.0)
Platelets: 209 10*3/uL (ref 150–400)
RBC: 4.4 MIL/uL (ref 3.87–5.11)
RDW: 14.2 % (ref 11.5–15.5)
WBC: 7.5 10*3/uL (ref 4.0–10.5)
nRBC: 0 % (ref 0.0–0.2)

## 2022-04-20 LAB — HIV ANTIBODY (ROUTINE TESTING W REFLEX): HIV Screen 4th Generation wRfx: NONREACTIVE

## 2022-04-20 LAB — GASTROINTESTINAL PANEL BY PCR, STOOL (REPLACES STOOL CULTURE)

## 2022-04-20 LAB — BASIC METABOLIC PANEL
Anion gap: 7 (ref 5–15)
BUN: 8 mg/dL (ref 6–20)
CO2: 26 mmol/L (ref 22–32)
Calcium: 8.1 mg/dL — ABNORMAL LOW (ref 8.9–10.3)
Chloride: 104 mmol/L (ref 98–111)
Creatinine, Ser: 0.93 mg/dL (ref 0.44–1.00)
GFR, Estimated: >60 mL/min (ref >60)
Glucose, Bld: 105 mg/dL — ABNORMAL HIGH (ref 70–99)
Potassium: 3.4 mmol/L — ABNORMAL LOW (ref 3.5–5.1)
Sodium: 137 mmol/L (ref 135–145)

## 2022-04-20 MED ORDER — POTASSIUM CHLORIDE CRYS ER 20 MEQ PO TBCR
40.0000 meq | EXTENDED_RELEASE_TABLET | Freq: Once | ORAL | Status: AC
  Administered 2022-04-20: 40 meq via ORAL
  Filled 2022-04-20: qty 2

## 2022-04-20 MED ORDER — AMOXICILLIN-POT CLAVULANATE 875-125 MG PO TABS: 1.0000 | ORAL_TABLET | Freq: Two times a day (BID) | ORAL | Status: DC

## 2022-04-20 MED ORDER — PANTOPRAZOLE SODIUM 40 MG PO TBEC: 40.0000 mg | DELAYED_RELEASE_TABLET | Freq: Every day | ORAL | 0 refills | Status: DC

## 2022-04-20 MED ORDER — IOHEXOL 9 MG/ML PO SOLN
500.0000 mL | ORAL | Status: AC
  Administered 2022-04-20: 500 mL via ORAL

## 2022-04-20 MED ORDER — DICYCLOMINE HCL 20 MG PO TABS: 20.0000 mg | ORAL_TABLET | Freq: Three times a day (TID) | ORAL | 0 refills | Status: DC

## 2022-04-20 MED ORDER — POTASSIUM CHLORIDE ER 20 MEQ PO TBCR: 40.0000 meq | EXTENDED_RELEASE_TABLET | Freq: Every day | ORAL | 0 refills | Status: DC

## 2022-04-20 MED ORDER — IOHEXOL 9 MG/ML PO SOLN
ORAL | Status: AC
  Administered 2022-04-20: 500 mL via ORAL
  Filled 2022-04-20: qty 1000

## 2022-04-20 MED ORDER — AMOXICILLIN-POT CLAVULANATE 875-125 MG PO TABS: 1.0000 | ORAL_TABLET | Freq: Two times a day (BID) | ORAL | 0 refills | Status: AC

## 2022-04-20 NOTE — Discharge Summary (Signed)
Physician Discharge Summary  Jacqueline Shepherd [id number] DOB: 1990-05-03 DOA: 04/17/2022  PCP: Merryl Hacker, No  Admit date: 04/17/2022 Discharge date: 04/20/2022  Admitted From: Home Disposition:  Home  Discharge Condition:Stable CODE STATUS:FULL Diet recommendation:  Regular   Brief/Interim Summary: Patient is a 32 year old female with history of morbid obesity, cannabinoid hyperemesis syndrome who presented here with complaint of intractable nausea, vomiting, abdominal pain. History of recurrent nausea and vomiting secondary to marijuana use. This time, patient reported that she was not using marijuana. On presentation, lab work showed WBC count of 22, potassium of 2.7. CT abdomen/pelvis showed mild bowel wall thickening and surrounding fat stranding within the sigmoid colon, bowel wall thickening and hyperemia in the distal ileum suggestive of infectious or inflammatory enterocolitis/colitis. Patient was started on IV antibiotics, antiemetics and IV fluids.  UDS positive for opiates, THC.  Hospital course remarkable for persistent abdominal pain.  Leukocytosis resolved with antibiotics.  Patient does not want to stay another day and wants to be discharged today.  Following problems were addressed during the hospitalization:  Enteritis/colitis: Presented with nausea, vomiting, abdominal pain.  CT abdomen/pelvis showed features of ileitis/sigmoid colitis.  Presented with leukocytosis. Started on Zosyn.  Leukocytosis resolved with antibiotics.   Abdominal pain is better today but not entirely gone.  Her abdomen remains soft, nontender, nondistended . Tolerating diet.  She complained of abdominal pain in the new area, left lower quadrant.  I offered her to do a CT imaging follow-up.  She does not want to stay another night and does not want to do the CT scan.  She wants to go home today. GI pathogen panel came out to be negative. Will continue Augmentin for 4 more days though the GI pathogen panel is  negative.  She presented with leukocytosis, stool sample was taken after antibiotics were given.   Intractable nausea and vomiting:  History of marijuana induced hyperemesis syndrome ,currently she denies taking it.  But UDS is positive.  Tolerating soft diet   Hypokalemia: Supplemented    Morbid obesity: BMI of 48.4  Discharge Diagnoses:  Principal Problem:   Intractable nausea and vomiting Active Problems:   Cannabinoid hyperemesis syndrome   Hypokalemia    Discharge Instructions  Discharge Instructions     Diet general   Complete by: As directed    Discharge instructions   Complete by: As directed    1)Please take prescribed medications as instructed 2)Follow up with your PCP in a week 3)Stop taking marijuna   Increase activity slowly   Complete by: As directed       Allergies as of 04/20/2022       Reactions   Ibuprofen Anaphylaxis        Medication List     STOP taking these medications    metoCLOPramide 10 MG tablet Commonly known as: REGLAN   metroNIDAZOLE 500 MG tablet Commonly known as: FLAGYL   naproxen 500 MG tablet Commonly known as: NAPROSYN   ondansetron 4 MG disintegrating tablet Commonly known as: ZOFRAN-ODT   Promethegan 25 MG suppository Generic drug: promethazine       TAKE these medications    amoxicillin-clavulanate 875-125 MG tablet Commonly known as: AUGMENTIN Take 1 tablet by mouth every 12 (twelve) hours for 4 days.   buPROPion 150 MG 12 hr tablet Commonly known as: Wellbutrin SR Take 1 tablet (150 mg total) by mouth at bedtime. What changed: Another medication with the same name was removed. Continue taking this medication, and follow the directions you  see here.   dicyclomine 20 MG tablet Commonly known as: BENTYL Take 1 tablet (20 mg total) by mouth 3 (three) times daily before meals.   ondansetron 4 MG tablet Commonly known as: ZOFRAN Take 1 tablet (4 mg total) by mouth every 6 (six) hours.   pantoprazole  40 MG tablet Commonly known as: Protonix Take 1 tablet (40 mg total) by mouth daily.   Potassium Chloride ER 20 MEQ Tbcr Take 2 tablets (40 mEq total) by mouth daily for 5 days.        Follow-up Information     [facility] .   Specialty: Emergency Medicine Why: If symptoms worsen Contact information: [place] [id number] [place] .   Why: As needed Contact information: [place] [phone]               Allergies  Allergen Reactions   Ibuprofen Anaphylaxis    Consultations: None   Procedures/Studies: DG Abd 1 View  Result Date: 04/18/2022 CLINICAL DATA:  Abdominal pain. EXAM: ABDOMEN - 1 VIEW COMPARISON:  06/03/2020 FINDINGS: No gaseous bowel dilatation to suggest obstruction. Visualized bony anatomy unremarkable. IUD projects over the mid pelvis. IMPRESSION: Nonobstructive bowel gas pattern. Electronically Signed   By: Misty Stanley M.D.   On: 04/18/2022 10:44   US PELVIC COMPLETE W TRANSVAGINAL AND TORSION R/O  Result Date: 04/17/2022 CLINICAL DATA:  Left abdominal pain EXAM: TRANSABDOMINAL AND TRANSVAGINAL ULTRASOUND OF PELVIS DOPPLER ULTRASOUND OF OVARIES TECHNIQUE: Both transabdominal and transvaginal ultrasound examinations of the pelvis were performed. Transabdominal technique was performed for global imaging of the pelvis including uterus, ovaries, adnexal regions, and pelvic cul-de-sac. It was necessary to proceed with endovaginal exam following the transabdominal exam to visualize the uterus endometrium ovaries. Color and duplex Doppler ultrasound was utilized to evaluate blood flow to the ovaries. COMPARISON:  CT 04/17/2022 FINDINGS: Uterus Measurements: 7.7 x 3.6 x 4.9 cm = volume: 72.1 mL. No fibroids or other mass visualized. Endometrium  Thickness: 2.7 mm.  IUD in the upper uterine segment. Right ovary Measurements: 2.5 x 1.8 x 1.8 cm = volume: 4.2 mL. Normal appearance/no adnexal mass. Left ovary Measurements: 3.3 x 2.1 x 2.6 cm = volume: 9.3 mL. Normal appearance/no adnexal mass. Pulsed Doppler evaluation of both ovaries demonstrates normal low-resistance arterial and venous waveforms. Other findings Trace free fluid IMPRESSION: 1. Limited by habitus. No definitive evidence for torsion or ovarian mass lesion. 2. IUD appears grossly appropriate in position by sonography. 3. Trace free fluid in the pelvis. Electronically Signed   By: Donavan Foil M.D.   On: 04/17/2022 23:26   CT Abdomen Pelvis W Contrast  Result Date: 04/17/2022 CLINICAL DATA:  Abdominal pain for 3 days. Pain is mid abdomen, worst on left side. EXAM: CT ABDOMEN AND PELVIS WITH CONTRAST TECHNIQUE: Multidetector CT imaging of the abdomen and pelvis was performed using the standard protocol following bolus administration of intravenous contrast. RADIATION DOSE REDUCTION: This exam was performed according to the departmental dose-optimization program which includes automated exposure control, adjustment of the mA and/or kV according to patient size and/or use of iterative reconstruction technique. CONTRAST:  149mL OMNIPAQUE IOHEXOL 300 MG/ML  SOLN COMPARISON:  03/23/2022. FINDINGS: Lower chest: The heart is mildly enlarged. The lung bases are clear. Hepatobiliary: No focal liver abnormality is seen. Layering hyperdense material is  present in the gallbladder. No biliary ductal dilatation. Pancreas: Unremarkable. No pancreatic ductal dilatation or surrounding inflammatory changes. Spleen: Normal in size without focal abnormality. Adrenals/Urinary Tract: No adrenal nodule or mass. The kidneys enhance symmetrically. No renal calculus or hydronephrosis. The bladder is unremarkable. Stomach/Bowel: Stomach is within normal limits. Appendix appears normal. There is mild thickening of the  walls of the sigmoid colon with surrounding fat stranding. There is thickening of the walls of the distal ileum with associated mesenteric hyperemia. No free air or pneumatosis. Vascular/Lymphatic: No significant vascular findings are present. No enlarged abdominal or pelvic lymph nodes. Reproductive: An IUD is present in the uterus. Mild fat stranding is noted about the left ovary. Other: A trace amount of free fluid is noted in the cul-de-sac and likely physiologic. A small fat containing umbilical hernia is present. Musculoskeletal: No acute or suspicious osseous abnormality. IMPRESSION: 1. Mild bowel wall thickening and surrounding fat stranding involving the sigmoid colon and bowel wall thickening and hyperemia of the distal ileum, suggesting infectious or inflammatory enteritis/colitis. 2. Fat stranding about the left ovary, which may be infectious or related to local inflammatory changes. Ultrasound is suggested for further evaluation. Electronically Signed   By: Brett Fairy M.D.   On: 04/17/2022 20:42   CT ABDOMEN PELVIS W CONTRAST  Result Date: 03/23/2022 CLINICAL DATA:  Abdominal pain and vomiting for the past 2 days. EXAM: CT ABDOMEN AND PELVIS WITH CONTRAST TECHNIQUE: Multidetector CT imaging of the abdomen and pelvis was performed using the standard protocol following bolus administration of intravenous contrast. RADIATION DOSE REDUCTION: This exam was performed according to the departmental dose-optimization program which includes automated exposure control, adjustment of the mA and/or kV according to patient size and/or use of iterative reconstruction technique. CONTRAST:  149mL OMNIPAQUE IOHEXOL 300 MG/ML  SOLN COMPARISON:  CT abdomen pelvis dated January 18, 2022. FINDINGS: Lower chest: No acute abnormality. Hepatobiliary: Unchanged mild diffusely decreased liver density. No focal liver abnormality. The gallbladder is unremarkable. No biliary dilatation. Pancreas: Unremarkable. No pancreatic  ductal dilatation or surrounding inflammatory changes. Spleen: Normal in size without focal abnormality. Adrenals/Urinary Tract: Adrenal glands are unremarkable. Kidneys are normal, without renal calculi, focal lesion, or hydronephrosis. Bladder is unremarkable. Stomach/Bowel: Unchanged small hiatal hernia. The stomach is otherwise within normal limits. No bowel wall thickening, distention, or surrounding inflammatory changes. Normal appendix. Vascular/Lymphatic: No significant vascular findings are present. No enlarged abdominal or pelvic lymph nodes. Reproductive: Unchanged IUD appropriately positioned within the endometrial canal. No adnexal mass. Other: No free fluid or pneumoperitoneum. Musculoskeletal: No acute or significant osseous findings. IMPRESSION: 1. No acute intra-abdominal process. 2. Unchanged hepatic steatosis. Electronically Signed   By: Titus Dubin M.D.   On: 03/23/2022 12:15      Subjective: Patient seen and examined at bedside today.  Her abdomen remains soft and nondistended.  [name] tolerating diet but she complains of pain in new area on the left lower quadrant.  CT abdomen/pelvis ordered.  Later on I was reported that patient does not want to stay any longer, denies CT scan and wants to go home  Discharge Exam: Vitals:   04/20/22 0431 04/20/22 1131  BP: 126/66 (!) 158/114  Pulse: (!) 50 64  Resp: 18 18  Temp: 98.7 F (37.1 C) 97.9 F (36.6 C)  SpO2: 100% 100%   Vitals:   04/19/22 1206 04/19/22 1946 04/20/22 0431 04/20/22 1131  BP: 135/74 105/83 126/66 (!) 158/114  Pulse: 69 75 (!) 50 64  Resp: 19 18  18 18  Temp: 99.3 F (37.4 C) 98.9 F (37.2 C) 98.7 F (37.1 C) 97.9 F (36.6 C)  TempSrc:  Oral Oral Oral  SpO2: 99% 100% 100% 100%  Weight:      Height:        General: Pt is alert, awake, not in acute distress,morbidly obese Cardiovascular: RRR, S1/S2 +, no rubs, no gallops Respiratory: CTA bilaterally, no wheezing, no rhonchi Abdominal: Soft, NT, ND,  bowel sounds + Extremities: no edema, no cyanosis    The results of significant diagnostics from this hospitalization (including imaging, microbiology, ancillary and laboratory) are listed below for reference.     Microbiology: Recent Results (from the past 240 hour(s))  Urine Culture     Status: Abnormal   Collection Time: 04/17/22  8:35 PM   Specimen: Urine, Clean Catch  Result Value Ref Range Status   Specimen Description   Final    URINE, CLEAN CATCH Performed at [facility], [place] [address], [place], [place] [address]    Special Requests   Final    NONE Performed at [facility], [place] [address] [place], [place] [address]    Culture (A)  Final    <10,000 COLONIES/mL INSIGNIFICANT GROWTH Performed at [facility] [address] [place], [place] [address]    Report Status 04/18/2022 FINAL  Final  Culture, blood (Routine X 2) w Reflex to ID Panel     Status: None (Preliminary result)   Collection Time: 04/18/22  7:30 AM   Specimen: BLOOD  Result Value Ref Range Status   Specimen Description   Final    BLOOD RIGHT ANTECUBITAL Performed at [facility] [address] [place], [place] [address]    Special Requests   Final    AEROBIC BOTTLE ONLY Blood Culture adequate volume Performed at [facility] [address] [place], [place] [address]    Culture   Final    NO GROWTH 2 DAYS Performed at [facility] [address] [place], [place] [address]    Report Status PENDING  Incomplete  Culture, blood (Routine X 2) w Reflex to ID Panel     Status: None (Preliminary result)   Collection Time: 04/18/22  7:30 AM   Specimen: BLOOD  Result Value Ref Range Status   Specimen Description   Final    BLOOD BLOOD RIGHT HAND Performed at [facility] [address], [place], [place] [address]    Special Requests   Final    BOTTLES DRAWN AEROBIC ONLY Blood Culture  results may not be optimal due to an inadequate volume of blood received in culture bottles Performed at [facility] [address], [place], [place] [address]    Culture   Final    NO GROWTH 2 DAYS Performed at [facility] [address], [place], [place] [address]    Report Status PENDING  Incomplete  Gastrointestinal Panel by PCR , Stool     Status: None   Collection Time: 04/18/22  8:11 AM   Specimen: Rectum; Stool  Result Value Ref Range Status   Campylobacter species NOT DETECTED NOT DETECTED Final   Plesimonas shigelloides NOT DETECTED NOT DETECTED Final   Salmonella species NOT DETECTED NOT DETECTED Final   Yersinia enterocolitica NOT DETECTED NOT DETECTED Final   Vibrio species NOT DETECTED NOT DETECTED Final   Vibrio cholerae NOT DETECTED NOT DETECTED Final   Enteroaggregative E  coli (EAEC) NOT DETECTED NOT DETECTED Final   Enteropathogenic E coli (EPEC) NOT DETECTED NOT DETECTED Final   Enterotoxigenic E coli (ETEC) NOT DETECTED NOT DETECTED Final   Shiga like toxin producing E coli (STEC) NOT DETECTED NOT DETECTED Final   Shigella/Enteroinvasive E coli (EIEC) NOT DETECTED NOT DETECTED Final   Cryptosporidium NOT DETECTED NOT DETECTED Final   Cyclospora cayetanensis NOT DETECTED NOT DETECTED Final   Entamoeba histolytica NOT DETECTED NOT DETECTED Final   Giardia lamblia NOT DETECTED NOT DETECTED Final   Adenovirus F40/41 NOT DETECTED NOT DETECTED Final   Astrovirus NOT DETECTED NOT DETECTED Final   Norovirus GI/GII NOT DETECTED NOT DETECTED Final   Rotavirus A NOT DETECTED NOT DETECTED Final   Sapovirus (I, II, IV, and V) NOT DETECTED NOT DETECTED Final    Comment: Performed at [facility], [place] [place], [place] [address]     Labs: BNP (last 3 results) No results for input(s): "BNP" in the last 8760 hours. Basic Metabolic Panel: Recent Labs  Lab 04/17/22 1004 04/17/22 2241 04/18/22 0122 04/19/22 0622  04/20/22 0605  NA 135  --  136 136 137  K 2.7*  --  4.1 3.2* 3.4*  CL 101  --  104 104 104  CO2 23  --  22 23 26   GLUCOSE 116*  --  129* 96 105*  BUN 10  --  8 7 8   CREATININE 0.83 0.82 0.73 0.86 0.93  CALCIUM 8.8*  --  8.6* 8.2* 8.1*  MG  --  2.0 1.9  --   --    Liver Function Tests: Recent Labs  Lab 04/17/22 1004  AST 12*  ALT 11  ALKPHOS 57  BILITOT 0.6  PROT 8.2*  ALBUMIN 3.8   Recent Labs  Lab 04/17/22 1004  LIPASE 24   No results for input(s): "AMMONIA" in the last 168 hours. CBC: Recent Labs  Lab 04/17/22 1004 04/17/22 2241 04/18/22 0532 04/20/22 0605  WBC 22.4* 18.5* 12.9* 7.5  NEUTROABS  --   --  10.7*  --   HGB 13.2 13.0 13.9 11.7*  HCT 40.6 39.9 43.3 36.5  MCV 82.4 81.1 82.5 83.0  PLT 244 239 230 209   Cardiac Enzymes: No results for input(s): "CKTOTAL", "CKMB", "CKMBINDEX", "TROPONINI" in the last 168 hours. BNP: Invalid input(s): "POCBNP" CBG: No results for input(s): "GLUCAP" in the last 168 hours. D-Dimer No results for input(s): "DDIMER" in the last 72 hours. Hgb A1c No results for input(s): "HGBA1C" in the last 72 hours. Lipid Profile No results for input(s): "CHOL", "HDL", "LDLCALC", "TRIG", "CHOLHDL", "LDLDIRECT" in the last 72 hours. Thyroid function studies No results for input(s): "TSH", "T4TOTAL", "T3FREE", "THYROIDAB" in the last 72 hours.  Invalid input(s): "FREET3" Anemia work up No results for input(s): "VITAMINB12", "FOLATE", "FERRITIN", "TIBC", "IRON", "RETICCTPCT" in the last 72 hours. Urinalysis    Component Value Date/Time   COLORURINE YELLOW 04/17/2022 1004   APPEARANCEUR HAZY (A) 04/17/2022 1004   LABSPEC 1.011 04/17/2022 1004   PHURINE 6.0 04/17/2022 1004   GLUCOSEU NEGATIVE 04/17/2022 1004   HGBUR MODERATE (A) 04/17/2022 1004   BILIRUBINUR NEGATIVE 04/17/2022 1004   BILIRUBINUR negative 02/23/2020 1059   KETONESUR 20 (A) 04/17/2022 1004   PROTEINUR 30 (A) 04/17/2022 1004   UROBILINOGEN 0.2 09/19/2021 1626    NITRITE NEGATIVE 04/17/2022 1004   LEUKOCYTESUR LARGE (A) 04/17/2022 1004   Sepsis Labs Recent Labs  Lab 04/17/22 1004 04/17/22 2241 04/18/22 0532 04/20/22 0605  WBC 22.4*  18.5* 12.9* 7.5   Microbiology Recent Results (from the past 240 hour(s))  Urine Culture     Status: Abnormal   Collection Time: 04/17/22  8:35 PM   Specimen: Urine, Clean Catch  Result Value Ref Range Status   Specimen Description   Final    URINE, CLEAN CATCH Performed at [facility], [place] [address] [place], [place] [address]    Special Requests   Final    NONE Performed at [facility], [place] [address], [place], [place] [address]    Culture (A)  Final    <10,000 COLONIES/mL INSIGNIFICANT GROWTH Performed at [facility] [address] [place], [place] [address]    Report Status 04/18/2022 FINAL  Final  Culture, blood (Routine X 2) w Reflex to ID Panel     Status: None (Preliminary result)   Collection Time: 04/18/22  7:30 AM   Specimen: BLOOD  Result Value Ref Range Status   Specimen Description   Final    BLOOD RIGHT ANTECUBITAL Performed at [facility] [address] [place], [place] [address]    Special Requests   Final    AEROBIC BOTTLE ONLY Blood Culture adequate volume Performed at [facility] [address], [place], [place] [address]    Culture   Final    NO GROWTH 2 DAYS Performed at [facility] [address], [place], [place] [address]    Report Status PENDING  Incomplete  Culture, blood (Routine X 2) w Reflex to ID Panel     Status: None (Preliminary result)   Collection Time: 04/18/22  7:30 AM   Specimen: BLOOD  Result Value Ref Range Status   Specimen Description   Final    BLOOD BLOOD RIGHT HAND Performed at [facility] [address], [place], [place] [address]    Special Requests   Final    BOTTLES DRAWN AEROBIC ONLY Blood Culture results may not be  optimal due to an inadequate volume of blood received in culture bottles Performed at [facility] [address] [place], [place] [address]    Culture   Final    NO GROWTH 2 DAYS Performed at [facility] [address], [place], [place] [address]    Report Status PENDING  Incomplete  Gastrointestinal Panel by PCR , Stool     Status: None   Collection Time: 04/18/22  8:11 AM   Specimen: Rectum; Stool  Result Value Ref Range Status   Campylobacter species NOT DETECTED NOT DETECTED Final   Plesimonas shigelloides NOT DETECTED NOT DETECTED Final   Salmonella species NOT DETECTED NOT DETECTED Final   Yersinia enterocolitica NOT DETECTED NOT DETECTED Final   Vibrio species NOT DETECTED NOT DETECTED Final   Vibrio cholerae NOT DETECTED NOT DETECTED Final   Enteroaggregative E coli (EAEC) NOT DETECTED NOT DETECTED Final   Enteropathogenic E coli (EPEC) NOT DETECTED NOT DETECTED Final   Enterotoxigenic E coli (ETEC) NOT DETECTED NOT DETECTED Final   Shiga like toxin producing E coli (STEC) NOT DETECTED NOT DETECTED Final   Shigella/Enteroinvasive E coli (EIEC) NOT DETECTED NOT DETECTED Final   Cryptosporidium NOT DETECTED NOT DETECTED Final   Cyclospora cayetanensis NOT DETECTED NOT DETECTED Final   Entamoeba histolytica NOT DETECTED NOT DETECTED Final   Giardia lamblia NOT DETECTED NOT DETECTED Final   Adenovirus F40/41 NOT DETECTED NOT DETECTED Final   Astrovirus NOT DETECTED NOT DETECTED Final  Norovirus GI/GII NOT DETECTED NOT DETECTED Final   Rotavirus A NOT DETECTED NOT DETECTED Final   Sapovirus (I, II, IV, and V) NOT DETECTED NOT DETECTED Final    Comment: Performed at [facility], [address], [place], [place] [address]    Please note: You were cared for by a hospitalist during your hospital stay. Once you are discharged, your primary care physician will handle any further medical issues. Please note that NO REFILLS for any discharge  medications will be authorized once you are discharged, as it is imperative that you return to your primary care physician (or establish a relationship with a primary care physician if you do not have one) for your post hospital discharge needs so that they can reassess your need for medications and monitor your lab values.    Time coordinating discharge: 40 minutes  SIGNED:   Shelly Coss, MD  [facility] 04/20/2022, 2:50 PM Pager [phone]  If 7PM-7AM, please contact night-coverage [email or web address] Password [email or web address]

## 2022-04-21 LAB — GC/CHLAMYDIA PROBE AMP (~~LOC~~) NOT AT ARMC
Chlamydia: NEGATIVE
Comment: NEGATIVE
Comment: NORMAL
Neisseria Gonorrhea: NEGATIVE

## 2022-04-23 LAB — CULTURE, BLOOD (ROUTINE X 2)
Culture: NO GROWTH
Culture: NO GROWTH
Special Requests: ADEQUATE

## 2022-06-10 ENCOUNTER — Encounter (HOSPITAL_COMMUNITY): Payer: Self-pay | Admitting: Emergency Medicine

## 2022-06-10 ENCOUNTER — Encounter (HOSPITAL_COMMUNITY): Payer: Self-pay

## 2022-06-10 ENCOUNTER — Emergency Department (HOSPITAL_COMMUNITY)
Admission: EM | Admit: 2022-06-10 | Discharge: 2022-06-10 | Disposition: A | Discharge status: Home / Self Care | Payer: Medicaid Other | Attending: Emergency Medicine | Admitting: Emergency Medicine

## 2022-06-10 ENCOUNTER — Emergency Department (HOSPITAL_COMMUNITY)
Admission: EM | Admit: 2022-06-10 | Discharge: 2022-06-11 | Disposition: A | Discharge status: Home / Self Care | Payer: Medicaid Other | Source: Home / Self Care | Attending: Emergency Medicine | Admitting: Emergency Medicine

## 2022-06-10 ENCOUNTER — Other Ambulatory Visit: Payer: Self-pay

## 2022-06-10 ENCOUNTER — Emergency Department (HOSPITAL_COMMUNITY): Payer: Medicaid Other

## 2022-06-10 DIAGNOSIS — R109 Unspecified abdominal pain: Secondary | ICD-10-CM | POA: Diagnosis not present

## 2022-06-10 DIAGNOSIS — R112 Nausea with vomiting, unspecified: Secondary | ICD-10-CM | POA: Insufficient documentation

## 2022-06-10 DIAGNOSIS — R799 Abnormal finding of blood chemistry, unspecified: Secondary | ICD-10-CM | POA: Insufficient documentation

## 2022-06-10 DIAGNOSIS — F17210 Nicotine dependence, cigarettes, uncomplicated: Secondary | ICD-10-CM | POA: Insufficient documentation

## 2022-06-10 LAB — CBC WITH DIFFERENTIAL/PLATELET
Abs Immature Granulocytes: 0 10*3/uL (ref 0.00–0.07)
Basophils Absolute: 0 10*3/uL (ref 0.0–0.1)
Basophils Relative: 0 %
Eosinophils Absolute: 0 10*3/uL (ref 0.0–0.5)
Eosinophils Relative: 0 %
HCT: 46.9 % — ABNORMAL HIGH (ref 36.0–46.0)
Hemoglobin: 15.8 g/dL — ABNORMAL HIGH (ref 12.0–15.0)
Lymphocytes Relative: 24 %
Lymphs Abs: 3 10*3/uL (ref 0.7–4.0)
MCH: 27.1 pg (ref 26.0–34.0)
MCHC: 33.7 g/dL (ref 30.0–36.0)
MCV: 80.4 fL (ref 80.0–100.0)
Monocytes Absolute: 0.7 10*3/uL (ref 0.1–1.0)
Monocytes Relative: 6 %
Neutro Abs: 8.7 10*3/uL — ABNORMAL HIGH (ref 1.7–7.7)
Neutrophils Relative %: 70 %
Platelets: 255 10*3/uL (ref 150–400)
RBC: 5.83 MIL/uL — ABNORMAL HIGH (ref 3.87–5.11)
RDW: 13.7 % (ref 11.5–15.5)
WBC: 12.4 10*3/uL — ABNORMAL HIGH (ref 4.0–10.5)
nRBC: 0 % (ref 0.0–0.2)

## 2022-06-10 LAB — CBC
HCT: 44.5 % (ref 36.0–46.0)
Hemoglobin: 14.4 g/dL (ref 12.0–15.0)
MCH: 26.6 pg (ref 26.0–34.0)
MCHC: 32.4 g/dL (ref 30.0–36.0)
MCV: 82.3 fL (ref 80.0–100.0)
Platelets: 220 10*3/uL (ref 150–400)
RBC: 5.41 MIL/uL — ABNORMAL HIGH (ref 3.87–5.11)
RDW: 14 % (ref 11.5–15.5)
WBC: 10.8 10*3/uL — ABNORMAL HIGH (ref 4.0–10.5)
nRBC: 0 % (ref 0.0–0.2)

## 2022-06-10 LAB — COMPREHENSIVE METABOLIC PANEL
ALT: 14 U/L (ref 0–44)
ALT: 17 U/L (ref 0–44)
AST: 19 U/L (ref 15–41)
AST: 20 U/L (ref 15–41)
Albumin: 4.1 g/dL (ref 3.5–5.0)
Albumin: 4.4 g/dL (ref 3.5–5.0)
Alkaline Phosphatase: 52 U/L (ref 38–126)
Alkaline Phosphatase: 61 U/L (ref 38–126)
Anion gap: 11 (ref 5–15)
Anion gap: 13 (ref 5–15)
BUN: 11 mg/dL (ref 6–20)
BUN: 14 mg/dL (ref 6–20)
CO2: 19 mmol/L — ABNORMAL LOW (ref 22–32)
CO2: 23 mmol/L (ref 22–32)
Calcium: 8.7 mg/dL — ABNORMAL LOW (ref 8.9–10.3)
Calcium: 9.2 mg/dL (ref 8.9–10.3)
Chloride: 101 mmol/L (ref 98–111)
Chloride: 99 mmol/L (ref 98–111)
Creatinine, Ser: 0.84 mg/dL (ref 0.44–1.00)
Creatinine, Ser: 1 mg/dL (ref 0.44–1.00)
GFR, Estimated: >60 mL/min (ref >60)
GFR, Estimated: >60 mL/min (ref >60)
Glucose, Bld: 137 mg/dL — ABNORMAL HIGH (ref 70–99)
Glucose, Bld: 128 mg/dL — ABNORMAL HIGH (ref 70–99)
Potassium: 3.9 mmol/L (ref 3.5–5.1)
Potassium: 3.1 mmol/L — ABNORMAL LOW (ref 3.5–5.1)
Sodium: 131 mmol/L — ABNORMAL LOW (ref 135–145)
Sodium: 135 mmol/L (ref 135–145)
Total Bilirubin: 0.9 mg/dL (ref 0.3–1.2)
Total Bilirubin: 1.1 mg/dL (ref 0.3–1.2)
Total Protein: 8.5 g/dL — ABNORMAL HIGH (ref 6.5–8.1)
Total Protein: 9.4 g/dL — ABNORMAL HIGH (ref 6.5–8.1)

## 2022-06-10 LAB — HCG, QUANTITATIVE, PREGNANCY: hCG, Beta Chain, Quant, S: <1 m[IU]/mL (ref <5)

## 2022-06-10 LAB — I-STAT BETA HCG BLOOD, ED (MC, WL, AP ONLY): I-stat hCG, quantitative: <5 m[IU]/mL (ref <5)

## 2022-06-10 LAB — LIPASE, BLOOD
Lipase: 26 U/L (ref 11–51)
Lipase: 28 U/L (ref 11–51)

## 2022-06-10 LAB — CBG MONITORING, ED: Glucose-Capillary: 141 mg/dL — ABNORMAL HIGH (ref 70–99)

## 2022-06-10 MED ORDER — DICYCLOMINE HCL 20 MG PO TABS: 20.0000 mg | ORAL_TABLET | Freq: Two times a day (BID) | ORAL | 0 refills | Status: DC

## 2022-06-10 MED ORDER — HALOPERIDOL LACTATE 5 MG/ML IJ SOLN
2.0000 mg | Freq: Once | INTRAMUSCULAR | Status: AC
  Administered 2022-06-10: 2 mg via INTRAVENOUS
  Filled 2022-06-10: qty 1

## 2022-06-10 MED ORDER — LACTATED RINGERS IV BOLUS
1000.0000 mL | Freq: Once | INTRAVENOUS | Status: AC
  Administered 2022-06-10: 1000 mL via INTRAVENOUS

## 2022-06-10 MED ORDER — ONDANSETRON HCL 4 MG/2ML IJ SOLN
4.0000 mg | Freq: Once | INTRAMUSCULAR | Status: AC
  Administered 2022-06-10: 4 mg via INTRAVENOUS
  Filled 2022-06-10: qty 2

## 2022-06-10 MED ORDER — HYDROMORPHONE HCL 1 MG/ML IJ SOLN
1.0000 mg | Freq: Once | INTRAMUSCULAR | Status: AC
  Administered 2022-06-10: 1 mg via INTRAVENOUS
  Filled 2022-06-10: qty 1

## 2022-06-10 MED ORDER — ONDANSETRON 4 MG PO TBDP: 4.0000 mg | ORAL_TABLET | Freq: Three times a day (TID) | ORAL | 0 refills | Status: DC | PRN

## 2022-06-10 MED ORDER — POTASSIUM CHLORIDE CRYS ER 20 MEQ PO TBCR
40.0000 meq | EXTENDED_RELEASE_TABLET | Freq: Once | ORAL | Status: AC
  Administered 2022-06-11: 40 meq via ORAL
  Filled 2022-06-10: qty 2

## 2022-06-10 MED ORDER — SODIUM CHLORIDE 0.9 % IV BOLUS
1000.0000 mL | Freq: Once | INTRAVENOUS | Status: AC
  Administered 2022-06-10: 1000 mL via INTRAVENOUS

## 2022-06-10 NOTE — Discharge Instructions (Addendum)
You were evaluated in the Emergency Department and after careful evaluation, we did not find any emergent condition requiring admission or further testing in the hospital.  Your exam/testing today was overall reassuring.  Take the Zofran as needed for nausea, take the Bentyl as needed for abdominal pain.  Follow-up with your primary care doctor.  Please return to the Emergency Department if you experience any worsening of your condition.  Thank you for allowing [place] to be a part of your care.

## 2022-06-10 NOTE — ED Triage Notes (Signed)
Pt. Bib [facility] for abdominal pain. Pt. States that she is having a gallbladder flare up. Pt. Was seen last night for the same.

## 2022-06-10 NOTE — ED Provider Notes (Signed)
[facility]-EMERGENCY DEPT [facility] Emergency Department Provider Note MRN:  [id number]  Arrival date & time: 06/10/22     Chief Complaint   Emesis and Abdominal Pain   History of Present Illness   Jacqueline Shepherd is a 32 y.o. year-old female with a history of cannabinoid hyperemesis syndrome presenting to the ED with chief complaint of abdominal pain and vomiting.  1 or 2 days of persistent abdominal pain, nausea vomiting, unable to keep down any food or drink.  Review of Systems  A thorough review of systems was obtained and all systems are negative except as noted in the HPI and PMH.   Patient's Health History    Past Medical History:  Diagnosis Date   Abdominal pain    Cannabinoid hyperemesis syndrome    Chlamydia infection complicating pregnancy in second trimester 08/18/2018   Negative test of cure on 08/06/18   Transient hypertension of pregnancy 10/12/2019   Weight loss 10/12/2019    Past Surgical History:  Procedure Laterality Date   CESAREAN SECTION N/A 05/14/2020   Procedure: CESAREAN SECTION;  Surgeon: Conan Bowens, MD;  Location: [facility] LD ORS;  Service: Obstetrics;  Laterality: N/A;  Primary C/S malpresentation & IUD placement   WISDOM TOOTH EXTRACTION      Family History  Problem Relation Age of Onset   Diabetes Mother    Hypertension Mother     Social History   Socioeconomic History   Marital status: Single    Spouse name: Not on file   Number of children: Not on file   Years of education: Not on file   Highest education level: Not on file  Occupational History   Not on file  Tobacco Use   Smoking status: Some Days    Packs/day: .25    Types: Cigarettes    Last attempt to quit: 01/22/2020    Years since quitting: 2.3   Smokeless tobacco: Never  Vaping Use   Vaping Use: Never used  Substance and Sexual Activity   Alcohol use: No   Drug use: Yes    Types: Marijuana    Comment: 05/06/20   Sexual activity: Not Currently    Partners: Male     Birth control/protection: None    Comment: Pregnant   Other Topics Concern   Not on file  Social History Narrative   Not on file   Social Determinants of Health   Financial Resource Strain: High Risk (08/05/2020)   Overall Financial Resource Strain (CARDIA)    Difficulty of Paying Living Expenses: Very hard  Food Insecurity: No Food Insecurity (04/17/2022)   Hunger Vital Sign    Worried About Running Out of Food in the Last Year: Never true    Ran Out of Food in the Last Year: Never true  Transportation Needs: No Transportation Needs (04/17/2022)   PRAPARE - Administrator, Civil Service (Medical): No    Lack of Transportation (Non-Medical): No  Physical Activity: Not on file  Stress: Stress Concern Present (08/05/2020)   [employer] of Occupational Health - Occupational Stress Questionnaire    Feeling of Stress : Very much  Social Connections: Moderately Isolated (08/05/2020)   Social Connection and Isolation Panel [NHANES]    Frequency of Communication with Friends and Family: More than three times a week    Frequency of Social Gatherings with Friends and Family: More than three times a week    Attends Religious Services: 1 to 4 times per year    Active  Member of Clubs or Organizations: No    Attends Banker Meetings: Never    Marital Status: Never married  Intimate Partner Violence: Not At Risk (04/17/2022)   Humiliation, Afraid, Rape, and Kick questionnaire    Fear of Current or Ex-Partner: No    Emotionally Abused: No    Physically Abused: No    Sexually Abused: No     Physical Exam   Vitals:   06/10/22 0021  BP: (!) 143/89  Pulse: (!) 55  Resp: 16  Temp: 98.1 F (36.7 C)  SpO2: 100%    CONSTITUTIONAL: Well-appearing, mild distress due to discomfort NEURO/PSYCH:  Alert and oriented x 3, no focal deficits EYES:  eyes equal and reactive ENT/NECK:  no LAD, no JVD CARDIO: Regular rate, well-perfused, normal S1 and S2 PULM:  CTAB no  wheezing or rhonchi GI/GU:  non-distended, non-tender MSK/SPINE:  No gross deformities, no edema SKIN:  no rash, atraumatic   *Additional and/or pertinent findings included in MDM below  Diagnostic and Interventional Summary    EKG Interpretation  Date/Time:    Ventricular Rate:    PR Interval:    QRS Duration:   QT Interval:    QTC Calculation:   R Axis:     Text Interpretation:         Labs Reviewed  CBC - Abnormal; Notable for the following components:      Result Value   WBC 10.8 (*)    RBC 5.41 (*)    All other components within normal limits  COMPREHENSIVE METABOLIC PANEL - Abnormal; Notable for the following components:   Sodium 131 (*)    CO2 19 (*)    Glucose, Bld 137 (*)    Calcium 8.7 (*)    Total Protein 8.5 (*)    All other components within normal limits  LIPASE, BLOOD  I-STAT BETA HCG BLOOD, ED ([facility], [facility], [facility] ONLY)    No orders to display    Medications  sodium chloride 0.9 % bolus 1,000 mL (0 mLs Intravenous Stopped 06/10/22 0204)  ondansetron (ZOFRAN) injection 4 mg (4 mg Intravenous Given 06/10/22 0055)  HYDROmorphone (DILAUDID) injection 1 mg (1 mg Intravenous Given 06/10/22 0054)  haloperidol lactate (HALDOL) injection 2 mg (2 mg Intravenous Given 06/10/22 0218)     Procedures  /  Critical Care Procedures  ED Course and Medical Decision Making  Initial Impression and Ddx Suspect intractable vomiting or cannabinoid hyperemesis, similar to prior episodes in the past.  Reassuring vital signs, soft abdomen, providing symptomatic management, obtaining labs, will reassess.  Currently without indication for advanced imaging.  Past medical/surgical history that increases complexity of ED encounter: Cannabinoid hyperemesis  Interpretation of Diagnostics I personally reviewed the tori assessment and my interpretation is as follows: No significant blood count or electrolyte disturbance    Patient Reassessment and Ultimate Disposition/Management      Patient doing much better after medications listed above, soft abdomen, normal vitals, appropriate for discharge.  Patient management required discussion with the following services or consulting groups:  None  Complexity of Problems Addressed Acute illness or injury that poses threat of life of bodily function  Additional Data Reviewed and Analyzed Further history obtained from: None  Additional Factors Impacting ED Encounter Risk Prescriptions and Use of parenteral controlled substances  Elmer Sow. Pilar Plate, MD [facility] Emergency Medicine [facility] [email or web address]  Final Clinical Impressions(s) / ED Diagnoses     ICD-10-CM   1. Nausea and vomiting, unspecified vomiting type  R11.2       ED Discharge Orders          Ordered    ondansetron (ZOFRAN-ODT) 4 MG disintegrating tablet  Every 8 hours PRN        06/10/22 0336    dicyclomine (BENTYL) 20 MG tablet  2 times daily        06/10/22 1610             Discharge Instructions Discussed with and Provided to Patient:     Discharge Instructions      You were evaluated in the Emergency Department and after careful evaluation, we did not find any emergent condition requiring admission or further testing in the hospital.  Your exam/testing today was overall reassuring.  Take the Zofran as needed for nausea, take the Bentyl as needed for abdominal pain.  Follow-up with your primary care doctor.  Please return to the Emergency Department if you experience any worsening of your condition.  Thank you for allowing [place] to be a part of your care.        Sabas Sous, MD 06/10/22 [phone]

## 2022-06-10 NOTE — ED Provider Notes (Incomplete)
[place] EMERGENCY DEPARTMENT AT [facility] Provider Note   CSN: [id number] Arrival date & time: 06/10/22  2122     History Chief Complaint  Patient presents with  . Abdominal Pain    Jacqueline Shepherd is a 32 y.o. female. Patient with a history of cannabinoid hyperemesis syndrome presents to the ED with complaints of abdominal pain and vomiting. She was seen last night for same. States that she has been having persistent abdominal pain, nausea, vomiting for a few days. Denies any fevers, diarrhea, chest pain, or shortness of breath.  Had symptomatic relief last night with medications with no imaging performed at that time given improvement in symptoms.   Abdominal Pain Associated symptoms: nausea and vomiting        Home Medications Prior to Admission medications   Medication Sig Start Date End Date Taking? Authorizing Provider  buPROPion (WELLBUTRIN SR) 150 MG 12 hr tablet Take 1 tablet (150 mg total) by mouth at bedtime. Patient not taking: Reported on 04/20/2022 08/27/21 08/27/22  Bobbitt, Franchot Mimes, NP  dicyclomine (BENTYL) 20 MG tablet Take 1 tablet (20 mg total) by mouth 2 (two) times daily. 06/10/22   Sabas Sous, MD  ondansetron (ZOFRAN) 4 MG tablet Take 1 tablet (4 mg total) by mouth every 6 (six) hours. 04/17/22   Cristopher Peru, PA-C  ondansetron (ZOFRAN-ODT) 4 MG disintegrating tablet Take 1 tablet (4 mg total) by mouth every 8 (eight) hours as needed for nausea or vomiting. 06/10/22   Sabas Sous, MD  pantoprazole (PROTONIX) 40 MG tablet Take 1 tablet (40 mg total) by mouth daily. 04/20/22 04/20/23  Burnadette Pop, MD  Potassium Chloride ER 20 MEQ TBCR Take 2 tablets (40 mEq total) by mouth daily for 5 days. 04/20/22 04/25/22  Burnadette Pop, MD  fluticasone (FLONASE) 50 MCG/ACT nasal spray Place 1 spray into both nostrils daily. Patient not taking: Reported on 07/03/2019 04/18/19 07/03/19  Henderly, Britni A, PA-C      Allergies    Ibuprofen    Review  of Systems   Review of Systems  Gastrointestinal:  Positive for abdominal pain, nausea and vomiting.  All other systems reviewed and are negative.   Physical Exam Updated Vital Signs BP (!) 159/74   Pulse (!) 50   Temp 99 F (37.2 C)   Resp 10   SpO2 97%  Physical Exam Vitals and nursing note reviewed.  Constitutional:      General: [name] not in acute distress.    Appearance: [name] well-developed.  HENT:     Head: Normocephalic and atraumatic.  Eyes:     Conjunctiva/sclera: Conjunctivae normal.  Cardiovascular:     Rate and Rhythm: Normal rate and regular rhythm.     Heart sounds: No murmur heard. Pulmonary:     Effort: Pulmonary effort is normal. No respiratory distress.     Breath sounds: Normal breath sounds.  Abdominal:     General: Abdomen is protuberant. Bowel sounds are normal. There is no distension.     Palpations: Abdomen is soft.     Tenderness: There is generalized abdominal tenderness. There is no right CVA tenderness or left CVA tenderness.  Musculoskeletal:        General: No swelling.     Cervical back: Neck supple.  Skin:    General: Skin is warm and dry.     Capillary Refill: Capillary refill takes less than 2 seconds.  Neurological:     Mental Status: [name]  alert.  Psychiatric:        Mood and Affect: Mood normal.     ED Results / Procedures / Treatments   Labs (all labs ordered are listed, but only abnormal results are displayed) Labs Reviewed  COMPREHENSIVE METABOLIC PANEL - Abnormal; Notable for the following components:      Result Value   Potassium 3.1 (*)    Glucose, Bld 128 (*)    Total Protein 9.4 (*)    All other components within normal limits  CBC WITH DIFFERENTIAL/PLATELET - Abnormal; Notable for the following components:   WBC 12.4 (*)    RBC 5.83 (*)    Hemoglobin 15.8 (*)    HCT 46.9 (*)    Neutro Abs 8.7 (*)    All other components within normal limits  CBG MONITORING, ED - Abnormal; Notable for the following  components:   Glucose-Capillary 141 (*)    All other components within normal limits  LIPASE, BLOOD  URINALYSIS, ROUTINE W REFLEX MICROSCOPIC  HCG, QUANTITATIVE, PREGNANCY  RAPID URINE DRUG SCREEN, HOSP PERFORMED    EKG None  Radiology US Abdomen Limited RUQ (LIVER/GB)  Result Date: 06/10/2022 CLINICAL DATA:  Right upper quadrant pain for 3 days EXAM: ULTRASOUND ABDOMEN LIMITED RIGHT UPPER QUADRANT COMPARISON:  04/17/2022 FINDINGS: Gallbladder: No gallstones or wall thickening visualized. No sonographic Murphy sign noted by sonographer. Common bile duct: Diameter: 6 mm Liver: No focal lesion identified. Within normal limits in parenchymal echogenicity. Portal vein is patent on color Doppler imaging with normal direction of blood flow towards the liver. Other: None. IMPRESSION: 1. Unremarkable right upper quadrant ultrasound. Electronically Signed   By: Sharlet Salina M.D.   On: 06/10/2022 22:33    Procedures Procedures   Medications Ordered in ED Medications  potassium chloride SA (KLOR-CON M) CR tablet 40 mEq (has no administration in time range)  lactated ringers bolus 1,000 mL (1,000 mLs Intravenous New Bag/Given 06/10/22 2212)  ondansetron (ZOFRAN) injection 4 mg (4 mg Intravenous Given 06/10/22 2210)  HYDROmorphone (DILAUDID) injection 1 mg (1 mg Intravenous Given 06/10/22 2211)    ED Course/ Medical Decision Making/ A&P                           Medical Decision Making Amount and/or Complexity of Data Reviewed Labs: ordered. Radiology: ordered.  Risk Prescription drug management.   This patient presents to the ED for concern of abdominal pain.  Differential diagnosis includes gastroenteritis, bowel obstruction, cannabis hyperemesis syndrome, cholecystitis   Lab Tests:  I Ordered, and personally interpreted labs.  The pertinent results include: CBC with leukocytosis increased compared to yesterday.  Could have been a possible dehydration as her blood cells, hemoglobin,  hematocrit also increased.  CMP with notable hypokalemia.  Pending UA, pending urine drug screen, hcg   Imaging Studies ordered:  I ordered imaging studies including ultrasound right upper quadrant, CT abdomen pelvis I independently visualized and interpreted imaging which showed US showing no acute findings. CT abdomen pelvis pending. I agree with the radiologist interpretation   Medicines ordered and prescription drug management:  I ordered medication including Dilaudid, Zofran, fluids, potassium chloride for pain, nausea, dehydration, hypokalemia  Reevaluation of the patient after these medicines showed that the patient improved I have reviewed the patients home medicines and have made adjustments as needed   Problem List / ED Course:  Patient presented to the ED for abdominal pain. Seen one day prior for similar symptoms but states  that pain has been worsening and cannot get it under control. Imaging not performed yesterday.   Final Clinical Impression(s) / ED Diagnoses Final diagnoses:  None    Rx / DC Orders ED Discharge Orders     None

## 2022-06-10 NOTE — ED Triage Notes (Signed)
Patient BIB EMS from home c/o abdominal pain. Pt report worsening pain tonight. Pt report vomitting since yesterday morning. Pt a/ox4.  BP 152/80 HR 56 RR 16 O2sat 100% on RA CBG 171

## 2022-06-10 NOTE — ED Provider Notes (Signed)
 [place] EMERGENCY DEPARTMENT AT [facility] Provider Note   CSN: [id number] Arrival date & time: 06/10/22  2122     History Chief Complaint  Patient presents with   Abdominal Pain    Jacqueline Shepherd is a 32 y.o. female. Patient with a history of cannabinoid hyperemesis syndrome presents to the ED with complaints of abdominal pain and vomiting. She was seen last night for same. States that she has been having persistent abdominal pain, nausea, vomiting for a few days. Denies any fevers, diarrhea, chest pain, or shortness of breath.  Had symptomatic relief last night with medications with no imaging performed at that time given improvement in symptoms.   Abdominal Pain Associated symptoms: nausea and vomiting        Home Medications Prior to Admission medications   Medication Sig Start Date End Date Taking? Authorizing Provider  buPROPion (WELLBUTRIN SR) 150 MG 12 hr tablet Take 1 tablet (150 mg total) by mouth at bedtime. Patient not taking: Reported on 04/20/2022 08/27/21 08/27/22  Bobbitt, Franchot Mimes, NP  dicyclomine (BENTYL) 20 MG tablet Take 1 tablet (20 mg total) by mouth 2 (two) times daily. 06/10/22   Sabas Sous, MD  ondansetron (ZOFRAN) 4 MG tablet Take 1 tablet (4 mg total) by mouth every 6 (six) hours. 04/17/22   Cristopher Peru, PA-C  ondansetron (ZOFRAN-ODT) 4 MG disintegrating tablet Take 1 tablet (4 mg total) by mouth every 8 (eight) hours as needed for nausea or vomiting. 06/10/22   Sabas Sous, MD  pantoprazole (PROTONIX) 40 MG tablet Take 1 tablet (40 mg total) by mouth daily. 04/20/22 04/20/23  Burnadette Pop, MD  Potassium Chloride ER 20 MEQ TBCR Take 2 tablets (40 mEq total) by mouth daily for 5 days. 04/20/22 04/25/22  Burnadette Pop, MD  fluticasone (FLONASE) 50 MCG/ACT nasal spray Place 1 spray into both nostrils daily. Patient not taking: Reported on 07/03/2019 04/18/19 07/03/19  Henderly, Britni A, PA-C      Allergies    Ibuprofen    Review  of Systems   Review of Systems  Gastrointestinal:  Positive for abdominal pain, nausea and vomiting.  All other systems reviewed and are negative.   Physical Exam Updated Vital Signs BP (!) 159/74   Pulse (!) 50   Temp 99 F (37.2 C)   Resp 10   SpO2 97%  Physical Exam Vitals and nursing note reviewed.  Constitutional:      General: [name] not in acute distress.    Appearance: [name] well-developed.  HENT:     Head: Normocephalic and atraumatic.  Eyes:     Conjunctiva/sclera: Conjunctivae normal.  Cardiovascular:     Rate and Rhythm: Normal rate and regular rhythm.     Heart sounds: No murmur heard. Pulmonary:     Effort: Pulmonary effort is normal. No respiratory distress.     Breath sounds: Normal breath sounds.  Abdominal:     General: Abdomen is protuberant. Bowel sounds are normal. There is no distension.     Palpations: Abdomen is soft.     Tenderness: There is generalized abdominal tenderness. There is no right CVA tenderness or left CVA tenderness.  Musculoskeletal:        General: No swelling.     Cervical back: Neck supple.  Skin:    General: Skin is warm and dry.     Capillary Refill: Capillary refill takes less than 2 seconds.  Neurological:     Mental Status: [name]  alert.  Psychiatric:        Mood and Affect: Mood normal.     ED Results / Procedures / Treatments   Labs (all labs ordered are listed, but only abnormal results are displayed) Labs Reviewed  COMPREHENSIVE METABOLIC PANEL - Abnormal; Notable for the following components:      Result Value   Potassium 3.1 (*)    Glucose, Bld 128 (*)    Total Protein 9.4 (*)    All other components within normal limits  CBC WITH DIFFERENTIAL/PLATELET - Abnormal; Notable for the following components:   WBC 12.4 (*)    RBC 5.83 (*)    Hemoglobin 15.8 (*)    HCT 46.9 (*)    Neutro Abs 8.7 (*)    All other components within normal limits  CBG MONITORING, ED - Abnormal; Notable for the following  components:   Glucose-Capillary 141 (*)    All other components within normal limits  LIPASE, BLOOD  HCG, QUANTITATIVE, PREGNANCY  URINALYSIS, ROUTINE W REFLEX MICROSCOPIC  RAPID URINE DRUG SCREEN, HOSP PERFORMED    EKG None  Radiology US Abdomen Limited RUQ (LIVER/GB)  Result Date: 06/10/2022 CLINICAL DATA:  Right upper quadrant pain for 3 days EXAM: ULTRASOUND ABDOMEN LIMITED RIGHT UPPER QUADRANT COMPARISON:  04/17/2022 FINDINGS: Gallbladder: No gallstones or wall thickening visualized. No sonographic Murphy sign noted by sonographer. Common bile duct: Diameter: 6 mm Liver: No focal lesion identified. Within normal limits in parenchymal echogenicity. Portal vein is patent on color Doppler imaging with normal direction of blood flow towards the liver. Other: None. IMPRESSION: 1. Unremarkable right upper quadrant ultrasound. Electronically Signed   By: Sharlet Salina M.D.   On: 06/10/2022 22:33    Procedures Procedures   Medications Ordered in ED Medications  potassium chloride SA (KLOR-CON M) CR tablet 40 mEq (has no administration in time range)  lactated ringers bolus 1,000 mL (1,000 mLs Intravenous New Bag/Given 06/10/22 2212)  ondansetron (ZOFRAN) injection 4 mg (4 mg Intravenous Given 06/10/22 2210)  HYDROmorphone (DILAUDID) injection 1 mg (1 mg Intravenous Given 06/10/22 2211)    ED Course/ Medical Decision Making/ A&P                           Medical Decision Making Amount and/or Complexity of Data Reviewed Labs: ordered. Radiology: ordered.  Risk Prescription drug management.   This patient presents to the ED for concern of abdominal pain.  Differential diagnosis includes gastroenteritis, bowel obstruction, cannabis hyperemesis syndrome, cholecystitis   Lab Tests:  I Ordered, and personally interpreted labs.  The pertinent results include: CBC with leukocytosis increased compared to yesterday.  Could have been a possible dehydration as her blood cells, hemoglobin,  hematocrit also increased.  CMP with notable hypokalemia.  Pending UA, pending urine drug screen, hcg   Imaging Studies ordered:  I ordered imaging studies including ultrasound right upper quadrant, CT abdomen pelvis I independently visualized and interpreted imaging which showed US showing no acute findings. CT abdomen pelvis pending. I agree with the radiologist interpretation   Medicines ordered and prescription drug management:  I ordered medication including Dilaudid, Zofran, fluids, potassium chloride for pain, nausea, dehydration, hypokalemia  Reevaluation of the patient after these medicines showed that the patient improved I have reviewed the patients home medicines and have made adjustments as needed   Problem List / ED Course:  Patient presented to the ED for abdominal pain. Seen one day prior for similar symptoms but states  that pain has been worsening and cannot get it under control. Imaging not performed yesterday.  On quick reassessment patient, she still reports that she is not having significant improvement in symptoms.  States that abdominal pain is still present all over.  Given patient's lack of improvement, I believe that she would benefit from CT imaging for further evaluation of abdomen. 12:13 AM Care of Kaziyah Kemppainen transferred to [facility] and Dr. Madilyn Hook at the end of my shift as the patient will require reassessment once labs/imaging have resulted. Patient presentation, ED course, and plan of care discussed with review of all pertinent labs and imaging. Please see his/her note for further details regarding further ED course and disposition. Plan at time of handoff is symptomatic management and wait for results of CT imaging.  Disposition of patient will likely be dependent on symptomatic improvement and results of CT. Currently anticipate that patient will likely be able to discharge home. This may be altered or completely changed at the discretion of the oncoming team  pending results of further workup.    Final Clinical Impression(s) / ED Diagnoses Final diagnoses:  None    Rx / DC Orders ED Discharge Orders     None         Smitty Knudsen, PA-C 06/11/22 0013    Vanetta Mulders, MD 06/11/22 2320

## 2022-06-11 ENCOUNTER — Ambulatory Visit (HOSPITAL_COMMUNITY): Payer: Medicaid Other | Attending: *Deleted

## 2022-06-11 ENCOUNTER — Emergency Department (HOSPITAL_COMMUNITY): Payer: Medicaid Other

## 2022-06-11 MED ORDER — HALOPERIDOL LACTATE 5 MG/ML IJ SOLN
2.0000 mg | Freq: Once | INTRAMUSCULAR | Status: AC
  Administered 2022-06-11: 2 mg via INTRAVENOUS
  Filled 2022-06-11: qty 1

## 2022-06-11 NOTE — Discharge Instructions (Addendum)
Your CT today did not show any acute findings. Can continue medications as prescribed yesterday. Can follow-up with GI if ongoing issues-- call office to set up appt. Can see your primary care doctor in the interim. Return here for new concerns.

## 2022-06-11 NOTE — ED Provider Notes (Signed)
Results for orders placed or performed during the hospital encounter of 06/10/22  Comprehensive metabolic panel  Result Value Ref Range   Sodium 135 135 - 145 mmol/L   Potassium 3.1 (L) 3.5 - 5.1 mmol/L   Chloride 99 98 - 111 mmol/L   CO2 23 22 - 32 mmol/L   Glucose, Bld 128 (H) 70 - 99 mg/dL   BUN 14 6 - 20 mg/dL   Creatinine, Ser 7.82 0.44 - 1.00 mg/dL   Calcium 9.2 8.9 - 95.6 mg/dL   Total Protein 9.4 (H) 6.5 - 8.1 g/dL   Albumin 4.4 3.5 - 5.0 g/dL   AST 20 15 - 41 U/L   ALT 17 0 - 44 U/L   Alkaline Phosphatase 61 38 - 126 U/L   Total Bilirubin 1.1 0.3 - 1.2 mg/dL   GFR, Estimated >21 >30 mL/min   Anion gap 13 5 - 15  CBC with Differential  Result Value Ref Range   WBC 12.4 (H) 4.0 - 10.5 K/uL   RBC 5.83 (H) 3.87 - 5.11 MIL/uL   Hemoglobin 15.8 (H) 12.0 - 15.0 g/dL   HCT 86.5 (H) 78.4 - 69.6 %   MCV 80.4 80.0 - 100.0 fL   MCH 27.1 26.0 - 34.0 pg   MCHC 33.7 30.0 - 36.0 g/dL   RDW 29.5 28.4 - 13.2 %   Platelets 255 150 - 400 K/uL   nRBC 0.0 0.0 - 0.2 %   Neutrophils Relative % 70 %   Neutro Abs 8.7 (H) 1.7 - 7.7 K/uL   Lymphocytes Relative 24 %   Lymphs Abs 3.0 0.7 - 4.0 K/uL   Monocytes Relative 6 %   Monocytes Absolute 0.7 0.1 - 1.0 K/uL   Eosinophils Relative 0 %   Eosinophils Absolute 0.0 0.0 - 0.5 K/uL   Basophils Relative 0 %   Basophils Absolute 0.0 0.0 - 0.1 K/uL   WBC Morphology VACUOLATED NEUTROPHILS    Abs Immature Granulocytes 0.00 0.00 - 0.07 K/uL  Lipase, blood  Result Value Ref Range   Lipase 28 11 - 51 U/L  hCG, quantitative, pregnancy  Result Value Ref Range   hCG, Beta Chain, Quant, S <1 <5 mIU/mL  CBG monitoring, ED  Result Value Ref Range   Glucose-Capillary 141 (H) 70 - 99 mg/dL   CT ABDOMEN PELVIS WO CONTRAST  Result Date: 06/11/2022 CLINICAL DATA:  Abdominal pain, acute, nonlocalized. Generalized abdominal pain, worsening symptoms EXAM: CT ABDOMEN AND PELVIS WITHOUT CONTRAST TECHNIQUE: Multidetector CT imaging of the abdomen and pelvis  was performed following the standard protocol without IV contrast. RADIATION DOSE REDUCTION: This exam was performed according to the departmental dose-optimization program which includes automated exposure control, adjustment of the mA and/or kV according to patient size and/or use of iterative reconstruction technique. COMPARISON:  04/17/2022 FINDINGS: Lower chest: No acute abnormality. Hepatobiliary: No focal liver abnormality is seen. No gallstones, gallbladder wall thickening, or biliary dilatation. Pancreas: Unremarkable. No pancreatic ductal dilatation or surrounding inflammatory changes. Spleen: Unremarkable Adrenals/Urinary Tract: Adrenal glands are unremarkable. Kidneys are normal, without renal calculi, focal lesion, or hydronephrosis. Bladder is unremarkable. Stomach/Bowel: Moderate transverse colonic diverticulosis without superimposed acute inflammatory change. Stomach, small bowel, and large bowel are otherwise unremarkable. Appendix normal. No free intraperitoneal gas or fluid Vascular/Lymphatic: Retroaortic left renal vein. The abdominal vasculature is otherwise unremarkable on this noncontrast examination. No pathologic adenopathy within the abdomen and pelvis. Reproductive: Intrauterine device in expected position. Pelvic organs are otherwise unremarkable. Other: Tiny fat containing umbilical  hernia. Musculoskeletal: No acute bone abnormality. No lytic or blastic bone lesion. IMPRESSION: 1. No acute intra-abdominal pathology identified. No definite radiographic explanation for the patient's reported symptoms. 2. Moderate transverse colonic diverticulosis without superimposed acute inflammatory change. Electronically Signed   By: Helyn Numbers M.D.   On: 06/11/2022 00:36   US Abdomen Limited RUQ (LIVER/GB)  Result Date: 06/10/2022 CLINICAL DATA:  Right upper quadrant pain for 3 days EXAM: ULTRASOUND ABDOMEN LIMITED RIGHT UPPER QUADRANT COMPARISON:  04/17/2022 FINDINGS: Gallbladder: No  gallstones or wall thickening visualized. No sonographic Murphy sign noted by sonographer. Common bile duct: Diameter: 6 mm Liver: No focal lesion identified. Within normal limits in parenchymal echogenicity. Portal vein is patent on color Doppler imaging with normal direction of blood flow towards the liver. Other: None. IMPRESSION: 1. Unremarkable right upper quadrant ultrasound. Electronically Signed   By: Sharlet Salina M.D.   On: 06/10/2022 22:33    CT without acute findings.  Suspect likely cannabinoid hyperemesis. She never did provide urine sample for UDS testing. Feel she is stable for discharge.  Can continue symptomatic care, given prescriptions in ED yesterday.  Can follow-up with PCP.  Return here for new concerns.  At time of discharge patient remains concerned that symptoms related to her gallbladder.  Discussed once again that [place] and CT did not show any signs of gallbladder pathology.  I do feel strongly this is likely her cannabinoid hyperemesis.  She requested GI follow-up, this has been given.  Can return here for new concerns.   Garlon Hatchet, PA-C 06/11/22 0415    Tilden Fossa, MD 06/11/22 [phone]

## 2022-07-03 ENCOUNTER — Other Ambulatory Visit (HOSPITAL_COMMUNITY)
Admission: RE | Admit: 2022-07-03 | Discharge: 2022-07-03 | Disposition: A | Discharge status: Home / Self Care | Payer: Medicaid Other | Source: Ambulatory Visit | Attending: Medical | Admitting: Medical

## 2022-07-03 ENCOUNTER — Encounter: Payer: Self-pay | Admitting: Obstetrics and Gynecology

## 2022-07-03 ENCOUNTER — Ambulatory Visit (INDEPENDENT_AMBULATORY_CARE_PROVIDER_SITE_OTHER): Payer: Medicaid Other | Admitting: Obstetrics and Gynecology

## 2022-07-03 VITALS — BP 134/89 | HR 77 | Ht 67.0 in | Wt 281.8 lb

## 2022-07-03 DIAGNOSIS — Z113 Encounter for screening for infections with a predominantly sexual mode of transmission: Secondary | ICD-10-CM | POA: Insufficient documentation

## 2022-07-03 DIAGNOSIS — Z124 Encounter for screening for malignant neoplasm of cervix: Secondary | ICD-10-CM | POA: Diagnosis present

## 2022-07-03 DIAGNOSIS — Z30432 Encounter for removal of intrauterine contraceptive device: Secondary | ICD-10-CM | POA: Diagnosis not present

## 2022-07-03 MED ORDER — PRENATAL 28-0.8 MG PO TABS: 1.0000 | ORAL_TABLET | Freq: Every day | ORAL | 12 refills | Status: DC

## 2022-07-03 NOTE — Progress Notes (Signed)
Pt presents for IUD removal. IUD placed 2 years ago.   Last PAP 08-06-2018 Requesting PAP and STD testing.

## 2022-07-03 NOTE — Progress Notes (Addendum)
    GYNECOLOGY OFFICE PROCEDURE NOTE  Jacqueline Shepherd is a 32 y.o. G2P1011 here for Liletta IUD removal. No GYN concerns.  Last pap smear was on 7/ 14/20 and was normal.  IUD Removal  Patient identified, informed consent performed, consent signed.  Patient was in the dorsal lithotomy position, normal external genitalia was noted.  A speculum was placed in the patient's vagina, normal discharge was noted, no lesions. The cervix was visualized, no lesions, no abnormal discharge.  The strings of the IUD were grasped and pulled using kelly forceps.  Patient tolerated the procedure well.    Patient will use nothing for contraception/does plans for pregnancy soon and she was told to avoid teratogens, take PNV and folic acid.  Routine preventative health maintenance measures emphasized.   1. Encounter for IUD removal 2. Cervical cancer screening Pap collected today - Cervicovaginal ancillary only - Cytology - PAP( [place])  3. Screen for STD (sexually transmitted disease)  - Cervicovaginal ancillary only   [facility] for [employer], [facility]

## 2022-07-05 LAB — CERVICOVAGINAL ANCILLARY ONLY
Chlamydia: NEGATIVE
Comment: NEGATIVE
Comment: NEGATIVE
Comment: NORMAL
Neisseria Gonorrhea: NEGATIVE
Trichomonas: NEGATIVE

## 2022-07-06 LAB — CYTOLOGY - PAP
Adequacy: ABSENT
Comment: NEGATIVE
Diagnosis: NEGATIVE
Diagnosis: REACTIVE
High risk HPV: NEGATIVE

## 2022-08-01 ENCOUNTER — Encounter (HOSPITAL_COMMUNITY): Payer: Self-pay

## 2022-08-01 ENCOUNTER — Other Ambulatory Visit: Payer: Self-pay

## 2022-08-01 ENCOUNTER — Emergency Department (HOSPITAL_COMMUNITY)
Admission: EM | Admit: 2022-08-01 | Discharge: 2022-08-02 | Disposition: A | Discharge status: Home / Self Care | Payer: MEDICAID | Attending: Emergency Medicine | Admitting: Emergency Medicine

## 2022-08-01 DIAGNOSIS — E876 Hypokalemia: Secondary | ICD-10-CM | POA: Insufficient documentation

## 2022-08-01 DIAGNOSIS — R1084 Generalized abdominal pain: Secondary | ICD-10-CM | POA: Diagnosis not present

## 2022-08-01 DIAGNOSIS — R112 Nausea with vomiting, unspecified: Secondary | ICD-10-CM | POA: Diagnosis not present

## 2022-08-01 DIAGNOSIS — D72829 Elevated white blood cell count, unspecified: Secondary | ICD-10-CM | POA: Insufficient documentation

## 2022-08-01 LAB — CBC WITH DIFFERENTIAL/PLATELET
Abs Immature Granulocytes: 0.03 10*3/uL (ref 0.00–0.07)
Basophils Absolute: 0 10*3/uL (ref 0.0–0.1)
Basophils Relative: 0 %
Eosinophils Absolute: 0 10*3/uL (ref 0.0–0.5)
Eosinophils Relative: 0 %
HCT: 40.6 % (ref 36.0–46.0)
Hemoglobin: 13 g/dL (ref 12.0–15.0)
Immature Granulocytes: 0 %
Lymphocytes Relative: 10 %
Lymphs Abs: 1.2 10*3/uL (ref 0.7–4.0)
MCH: 26.4 pg (ref 26.0–34.0)
MCHC: 32 g/dL (ref 30.0–36.0)
MCV: 82.4 fL (ref 80.0–100.0)
Monocytes Absolute: 0.4 10*3/uL (ref 0.1–1.0)
Monocytes Relative: 3 %
Neutro Abs: 9.9 10*3/uL — ABNORMAL HIGH (ref 1.7–7.7)
Neutrophils Relative %: 87 %
Platelets: 211 10*3/uL (ref 150–400)
RBC: 4.93 MIL/uL (ref 3.87–5.11)
RDW: 13.3 % (ref 11.5–15.5)
WBC: 11.5 10*3/uL — ABNORMAL HIGH (ref 4.0–10.5)
nRBC: 0 % (ref 0.0–0.2)

## 2022-08-01 LAB — COMPREHENSIVE METABOLIC PANEL
ALT: 15 U/L (ref 0–44)
AST: 15 U/L (ref 15–41)
Albumin: 3.9 g/dL (ref 3.5–5.0)
Alkaline Phosphatase: 58 U/L (ref 38–126)
Anion gap: 11 (ref 5–15)
BUN: 11 mg/dL (ref 6–20)
CO2: 20 mmol/L — ABNORMAL LOW (ref 22–32)
Calcium: 8.8 mg/dL — ABNORMAL LOW (ref 8.9–10.3)
Chloride: 106 mmol/L (ref 98–111)
Creatinine, Ser: 0.89 mg/dL (ref 0.44–1.00)
GFR, Estimated: >60 mL/min (ref >60)
Glucose, Bld: 151 mg/dL — ABNORMAL HIGH (ref 70–99)
Potassium: 3 mmol/L — ABNORMAL LOW (ref 3.5–5.1)
Sodium: 137 mmol/L (ref 135–145)
Total Bilirubin: 0.7 mg/dL (ref 0.3–1.2)
Total Protein: 8.1 g/dL (ref 6.5–8.1)

## 2022-08-01 LAB — LIPASE, BLOOD: Lipase: 27 U/L (ref 11–51)

## 2022-08-01 MED ORDER — FENTANYL CITRATE PF 50 MCG/ML IJ SOSY
50.0000 ug | PREFILLED_SYRINGE | Freq: Once | INTRAMUSCULAR | Status: AC
  Administered 2022-08-01: 50 ug via INTRAVENOUS
  Filled 2022-08-01: qty 1

## 2022-08-01 MED ORDER — POTASSIUM CHLORIDE CRYS ER 20 MEQ PO TBCR
40.0000 meq | EXTENDED_RELEASE_TABLET | Freq: Once | ORAL | Status: AC
  Administered 2022-08-01: 40 meq via ORAL
  Filled 2022-08-01: qty 2

## 2022-08-01 MED ORDER — DIPHENHYDRAMINE HCL 50 MG/ML IJ SOLN
25.0000 mg | Freq: Once | INTRAMUSCULAR | Status: AC
  Administered 2022-08-01: 25 mg via INTRAVENOUS
  Filled 2022-08-01: qty 1

## 2022-08-01 MED ORDER — HYDROMORPHONE HCL 1 MG/ML IJ SOLN
1.0000 mg | Freq: Once | INTRAMUSCULAR | Status: AC
  Administered 2022-08-01: 1 mg via INTRAVENOUS
  Filled 2022-08-01 (×2): qty 1

## 2022-08-01 MED ORDER — METOCLOPRAMIDE HCL 5 MG/ML IJ SOLN
10.0000 mg | Freq: Once | INTRAMUSCULAR | Status: AC
  Administered 2022-08-01: 10 mg via INTRAVENOUS
  Filled 2022-08-01: qty 2

## 2022-08-01 MED ORDER — ONDANSETRON HCL 4 MG/2ML IJ SOLN
4.0000 mg | Freq: Once | INTRAMUSCULAR | Status: AC
  Administered 2022-08-01: 4 mg via INTRAVENOUS
  Filled 2022-08-01 (×2): qty 2

## 2022-08-01 NOTE — ED Provider Notes (Signed)
[place] EMERGENCY DEPARTMENT AT [address] [facility] Provider Note   CSN: [id number] Arrival date & time: 08/01/22  1657     History  Chief Complaint  Patient presents with   Abdominal Pain   Nausea    Lun TEAGYN FISHEL is a 32 y.o. female with a past medical history of cannabinoid hyperemesis syndrome brought in by EMS for evaluation of abdominal pain and vomiting.  States she has been having persistent abdominal pain, nausea and vomiting since yesterday.  Pain is all over her abdomen, constant, nonradiating.  She denies any fever, diarrhea, constipation, chest pain or shortness of breath.  States she has not smoked marijuana in the last 30 days.   Abdominal Pain     Past Medical History:  Diagnosis Date   Abdominal pain    Cannabinoid hyperemesis syndrome    Chlamydia infection complicating pregnancy in second trimester 08/18/2018   Negative test of cure on 08/06/18   Transient hypertension of pregnancy 10/12/2019   Weight loss 10/12/2019   Past Surgical History:  Procedure Laterality Date   CESAREAN SECTION N/A 05/14/2020   Procedure: CESAREAN SECTION;  Surgeon: Conan Bowens, MD;  Location: [facility] LD ORS;  Service: Obstetrics;  Laterality: N/A;  Primary C/S malpresentation & IUD placement   WISDOM TOOTH EXTRACTION       Home Medications Prior to Admission medications   Medication Sig Start Date End Date Taking? Authorizing Provider  buPROPion (WELLBUTRIN SR) 150 MG 12 hr tablet Take 1 tablet (150 mg total) by mouth at bedtime. Patient not taking: Reported on 04/20/2022 08/27/21 08/27/22  Bobbitt, Franchot Mimes, NP  dicyclomine (BENTYL) 20 MG tablet Take 1 tablet (20 mg total) by mouth 2 (two) times daily. Patient not taking: Reported on 07/03/2022 06/10/22   Sabas Sous, MD  ondansetron (ZOFRAN) 4 MG tablet Take 1 tablet (4 mg total) by mouth every 6 (six) hours. Patient not taking: Reported on 07/03/2022 04/17/22   Cristopher Peru, PA-C  ondansetron (ZOFRAN-ODT) 4 MG  disintegrating tablet Take 1 tablet (4 mg total) by mouth every 8 (eight) hours as needed for nausea or vomiting. Patient not taking: Reported on 07/03/2022 06/10/22   Sabas Sous, MD  pantoprazole (PROTONIX) 40 MG tablet Take 1 tablet (40 mg total) by mouth daily. Patient not taking: Reported on 07/03/2022 04/20/22 04/20/23  Burnadette Pop, MD  Potassium Chloride ER 20 MEQ TBCR Take 2 tablets (40 mEq total) by mouth daily for 5 days. 04/20/22 04/25/22  Burnadette Pop, MD  Prenatal 28-0.8 MG TABS Take 1 tablet by mouth daily. 07/03/22   Sue Lush, FNP  fluticasone (FLONASE) 50 MCG/ACT nasal spray Place 1 spray into both nostrils daily. Patient not taking: Reported on 07/03/2019 04/18/19 07/03/19  Henderly, Britni A, PA-C      Allergies    Ibuprofen    Review of Systems   Review of Systems  Gastrointestinal:  Positive for abdominal pain.    Physical Exam Updated Vital Signs BP (!) 155/95 (BP Location: Right Arm)   Pulse (!) 57   Temp 98 F (36.7 C) (Oral)   Resp 20   Ht 5\' 7"  (1.702 m)   Wt (!) 158.8 kg   SpO2 99%   BMI 54.82 kg/m  Physical Exam Vitals and nursing note reviewed.  Constitutional:      Appearance: Normal appearance.  HENT:     Head: Normocephalic and atraumatic.     Mouth/Throat:     Mouth: Mucous membranes are moist.  Eyes:     General: No scleral icterus. Cardiovascular:     Rate and Rhythm: Normal rate and regular rhythm.     Pulses: Normal pulses.     Heart sounds: Normal heart sounds.  Pulmonary:     Effort: Pulmonary effort is normal.     Breath sounds: Normal breath sounds.  Abdominal:     General: Abdomen is flat.     Palpations: Abdomen is soft.     Tenderness: There is generalized abdominal tenderness.  Musculoskeletal:        General: No deformity.  Skin:    General: Skin is warm.     Findings: No rash.  Neurological:     General: No focal deficit present.     Mental Status: [name] alert.  Psychiatric:        Mood and Affect: Mood  normal.     ED Results / Procedures / Treatments   Labs (all labs ordered are listed, but only abnormal results are displayed) Labs Reviewed - No data to display  EKG None  Radiology No results found.  Procedures Procedures    Medications Ordered in ED Medications - No data to display  ED Course/ Medical Decision Making/ A&P                             Medical Decision Making Amount and/or Complexity of Data Reviewed Labs: ordered. Radiology: ordered.  Risk Prescription drug management.   This patient presents to the ED for abdominal pain, nausea, ,vomiting, this involves an extensive number of treatment options, and is a complaint that carries with a high risk of complications and morbidity.  The differential diagnosis includes small bowel obstruction, acute gastroenteritis, appendicitis, gallbladder/biliary, pancreatitis, peptic ulcer disease, perforation, vertigo ACS/MI DKA, EtOH intoxication, cannabinoid hyperemesis..  This is not an exhaustive list.  Lab tests: I ordered and personally interpreted labs.  The pertinent results include: WBC 11.5. Hbg unremarkable. Platelets unremarkable. Electrolytes significant for potassium 3.0. BUN, creatinine unremarkable.  Lipase normal.  UA ordered and pending.  Imaging studies: CT abdomen pelvis ordered and pending.  Problem list/ ED course/ Critical interventions/ Medical management: HPI: See above Vital signs within normal range and stable throughout visit. Laboratory/imaging studies significant for: See above. On physical examination, patient is afebrile and appears in no acute distress.  CBC showed leukocytosis 11.5.  No evidence of anemia.  CMP with hypokalemia of 3.0.  No other acute electrolyte normality.  No sign of dehydration causing prerenal AKI.  Low suspicion for gastric or esophageal dysmotility as cause.  Symptom is likely secondary to cannabinoid hyperemesis syndrome.  Low suspicion but cannot rule out SBO or  other intraabdominal abnormality at this point.  CT abdomen pelvis ordered and pending.  Patient was given Dilaudid and Zofran.  Reevaluation of patient after this medication showed that the patient remained the same.  I have ordered a second round of pain and nausea medication. I have reviewed the patient home medicines and have made adjustments as needed.  Cardiac monitoring/EKG: The patient was maintained on a cardiac monitor.  I personally reviewed and interpreted the cardiac monitor which showed an underlying rhythm of: sinus rhythm.  Additional history obtained: External records from outside source obtained and reviewed including: Chart review including previous notes, labs, imaging.  Consultations obtained:  Disposition Patient CARE signed out at shift change to Ivar Drape, PA-C with pending CT abdomen pelvis and nausea, vomiting control. This chart  was dictated using voice recognition software.  Despite best efforts to proofread,  errors can occur which can change the documentation meaning.          Final Clinical Impression(s) / ED Diagnoses Final diagnoses:  Nausea and vomiting, unspecified vomiting type    Rx / DC Orders ED Discharge Orders     None         Jeanelle Malling, [place] 08/01/22 2336    Derwood Kaplan, MD 08/02/22 1925

## 2022-08-01 NOTE — ED Notes (Signed)
US PIV placed. 

## 2022-08-01 NOTE — ED Triage Notes (Signed)
Patient presented to ER via [facility] for abdominal pain and vomiting since last night. Vomit appears to be light green. Per EMS patient refusing to answer questions states her stomach hurts too bad. Patient rolling around on EMS stretcher.

## 2022-08-01 NOTE — ED Notes (Addendum)
Pt stood up out of hall bed C, undressed completely, runs to the bathroom stating "Oh lord help me, I'm in pain and I need to pee". Not following direction of this RN when informing pt to wait until a hat is in the toilet for a urine sample. Pt rips out IV during this process. MD made aware. Dr Silverio Lay to [place] another IV for pt

## 2022-08-02 ENCOUNTER — Encounter (HOSPITAL_COMMUNITY): Payer: Self-pay

## 2022-08-02 ENCOUNTER — Emergency Department (HOSPITAL_COMMUNITY): Payer: MEDICAID

## 2022-08-02 LAB — URINALYSIS, ROUTINE W REFLEX MICROSCOPIC
Bilirubin Urine: NEGATIVE
Glucose, UA: 50 mg/dL — AB
Ketones, ur: 80 mg/dL — AB
Leukocytes,Ua: NEGATIVE
Nitrite: NEGATIVE
Protein, ur: NEGATIVE mg/dL
Specific Gravity, Urine: 1.02 (ref 1.005–1.030)
pH: 7 (ref 5.0–8.0)

## 2022-08-02 LAB — HCG, QUANTITATIVE, PREGNANCY: hCG, Beta Chain, Quant, S: <1 m[IU]/mL (ref <5)

## 2022-08-02 LAB — RAPID URINE DRUG SCREEN, HOSP PERFORMED
Amphetamines: NOT DETECTED
Barbiturates: NOT DETECTED
Benzodiazepines: NOT DETECTED
Cocaine: NOT DETECTED
Opiates: NOT DETECTED
Tetrahydrocannabinol: POSITIVE — AB

## 2022-08-02 MED ORDER — ACETAMINOPHEN 500 MG PO TABS
1000.0000 mg | ORAL_TABLET | Freq: Once | ORAL | Status: AC
  Administered 2022-08-02: 1000 mg via ORAL
  Filled 2022-08-02: qty 2

## 2022-08-02 MED ORDER — IOHEXOL 300 MG/ML  SOLN
100.0000 mL | Freq: Once | INTRAMUSCULAR | Status: AC | PRN
  Administered 2022-08-02: 100 mL via INTRAVENOUS

## 2022-08-02 MED ORDER — METOCLOPRAMIDE HCL 10 MG PO TABS: 10.0000 mg | ORAL_TABLET | Freq: Four times a day (QID) | ORAL | 0 refills | Status: DC

## 2022-08-02 MED ORDER — SODIUM CHLORIDE (PF) 0.9 % IJ SOLN
INTRAMUSCULAR | Status: AC
  Filled 2022-08-02: qty 50

## 2022-08-02 NOTE — ED Notes (Signed)
This nurse in with CT tech to assess IV site. Pts IV has been used for medication administration with no complaint. Pt IV flushed with 30ml NS, pt does not complain of burning or pain during this. No swelling noted to site. No IV contrast present for scan, likelihood of infiltration. Roxy Horseman PA notified. Instructed to remove IV and follow radiology recommendations for follow up care

## 2022-08-02 NOTE — ED Notes (Signed)
Pts IV removed and dressing applied. This is the pts second [place] line. CT scan of abd to be done without contrast

## 2022-08-02 NOTE — ED Provider Notes (Signed)
Patient here with nausea and vomiting.  Signed out to me at shift change pending CT.  Concern for cannabinoid hyperemesis syndrome.  Noncontrasted CT scan is negative for acute findings.  Patient did have infiltration of her IV during CT.  Will give patient prescription of Reglan.  Advised cessation of marijuana use.   Roxy Horseman, PA-C 08/02/22 0235    Nira Conn, MD 08/03/22 [phone]

## 2022-08-02 NOTE — ED Notes (Signed)
Pt provided with an ice pack and instructions for care to infiltrated site reviewed

## 2022-08-02 NOTE — ED Notes (Signed)
Discharge instructions reviewed with significant other. Reviewed importance of cessation of cannabis use and if GI s/s still present to follow up with GI as there was nothing emergent found today on blood work or CT scan per provider. After care for IV site reviewed as well. Radiology PA to follow up with pt. Significant other verbalized understanding. Pt left ED via wheelchair

## 2022-11-27 ENCOUNTER — Encounter (HOSPITAL_COMMUNITY): Payer: Self-pay

## 2022-11-27 ENCOUNTER — Ambulatory Visit (HOSPITAL_COMMUNITY)
Admission: EM | Admit: 2022-11-27 | Discharge: 2022-11-27 | Disposition: A | Discharge status: Home / Self Care | Payer: MEDICAID | Attending: Family Medicine | Admitting: Family Medicine

## 2022-11-27 DIAGNOSIS — Z113 Encounter for screening for infections with a predominantly sexual mode of transmission: Secondary | ICD-10-CM

## 2022-11-27 DIAGNOSIS — H6991 Unspecified Eustachian tube disorder, right ear: Secondary | ICD-10-CM

## 2022-11-27 MED ORDER — FLUTICASONE PROPIONATE 50 MCG/ACT NA SUSP: 2.0000 | Freq: Two times a day (BID) | NASAL | 2 refills | Status: DC | PRN

## 2022-11-27 MED ORDER — LEVOCETIRIZINE DIHYDROCHLORIDE 5 MG PO TABS: 5.0000 mg | ORAL_TABLET | Freq: Every evening | ORAL | 0 refills | Status: DC

## 2022-11-27 NOTE — Discharge Instructions (Signed)
For the waiting your ear I have prescribed levocetirizine and Flonase.  Use as prescribed. Your STD test results will be available within 24 to 48 hours.  Results will update directly to my chart.  Our office will contact you only if treatment is indicated.

## 2022-11-27 NOTE — ED Triage Notes (Signed)
Pt is here for STI-testing. Denies any symptoms.

## 2022-11-27 NOTE — ED Provider Notes (Addendum)
[facility]    CSN: [id number] Arrival date & time: 11/27/22  1331      History   Chief Complaint Chief Complaint  Patient presents with   std screening    HPI Jacqueline Shepherd is a 32 y.o. female.   HPI Patient here today for routine STD screening.  Currently asymptomatic.  Would like HIV and RPR testing.  Denies any concerns of pregnancy. Patient has new sexual partner.  Patient is also requesting evaluation of her right ear.  She reports that her right ear is clogged and she is unable to hear.  Has no history of cerumen impaction.  Denies any ear pain.  Denies any recent URI. Past Medical History:  Diagnosis Date   Abdominal pain    Cannabinoid hyperemesis syndrome    Chlamydia infection complicating pregnancy in second trimester 08/18/2018   Negative test of cure on 08/06/18   Transient hypertension of pregnancy 10/12/2019   Weight loss 10/12/2019    Patient Active Problem List   Diagnosis Date Noted   Intractable nausea and vomiting 04/17/2022   Hypokalemia 04/17/2022   Cannabis hyperemesis syndrome concurrent with and due to cannabis abuse (HCC) 06/04/2020   IUD (intrauterine device) in place 05/15/2020   Fetal malpresentation 05/14/2020   Preeclampsia 05/10/2020   Antepartum mild preeclampsia 05/07/2020   Carpal tunnel syndrome during pregnancy 04/15/2020   Gestational diabetes mellitus (GDM), antepartum 04/13/2020   Dysuria during pregnancy in second trimester 02/23/2020   Abnormal TSH 12/09/2019   Hyperemesis gravidarum, antepartum 11/22/2019   Tobacco use in pregnancy, antepartum, first trimester 11/11/2019   Other social stressor 11/11/2019   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   Obesity in pregnancy 10/12/2019   BMI 40.0-44.9, adult (HCC) 10/12/2019   Cannabinoid hyperemesis syndrome    Chlamydia infection affecting pregnancy in second trimester 08/18/2018   Rubella non-immune status, antepartum 08/18/2018    Past Surgical  History:  Procedure Laterality Date   CESAREAN SECTION N/A 05/14/2020   Procedure: CESAREAN SECTION;  Surgeon: Conan Bowens, MD;  Location: [facility];  Service: Obstetrics;  Laterality: N/A;  Primary C/S malpresentation & IUD placement   WISDOM TOOTH EXTRACTION      OB History     Gravida  2   Para  1   Term  1   Preterm      AB  1   Living  1      SAB  1   IAB      Ectopic      Multiple  0   Live Births  1            Home Medications    Prior to Admission medications   Medication Sig Start Date End Date Taking? Authorizing Provider  fluticasone (FLONASE) 50 MCG/ACT nasal spray Place 2 sprays into both nostrils 2 (two) times daily as needed for allergies or rhinitis. 11/27/22  Yes Bing Neighbors, NP  levocetirizine (XYZAL) 5 MG tablet Take 1 tablet (5 mg total) by mouth every evening. 11/27/22  Yes Bing Neighbors, NP  buPROPion [facility] SR) 150 MG 12 hr tablet Take 1 tablet (150 mg total) by mouth at bedtime. Patient not taking: Reported on 04/20/2022 08/27/21 08/27/22  Bobbitt, Franchot Mimes, NP  dicyclomine (BENTYL) 20 MG tablet Take 1 tablet (20 mg total) by mouth 2 (two) times daily. Patient not taking: Reported on 07/03/2022 06/10/22   Sabas Sous, MD  metoCLOPramide (REGLAN) 10 MG tablet Take 1 tablet (  10 mg total) by mouth every 6 (six) hours. 08/02/22   Roxy Horseman, PA-C  ondansetron (ZOFRAN) 4 MG tablet Take 1 tablet (4 mg total) by mouth every 6 (six) hours. Patient not taking: Reported on 07/03/2022 04/17/22   Cristopher Peru, PA-C  ondansetron (ZOFRAN-ODT) 4 MG disintegrating tablet Take 1 tablet (4 mg total) by mouth every 8 (eight) hours as needed for nausea or vomiting. Patient not taking: Reported on 07/03/2022 06/10/22   Sabas Sous, MD  pantoprazole (PROTONIX) 40 MG tablet Take 1 tablet (40 mg total) by mouth daily. Patient not taking: Reported on 07/03/2022 04/20/22 04/20/23  Burnadette Pop, MD  Potassium Chloride ER 20 MEQ TBCR Take 2  tablets (40 mEq total) by mouth daily for 5 days. 04/20/22 04/25/22  Burnadette Pop, MD  Prenatal 28-0.8 MG TABS Take 1 tablet by mouth daily. 07/03/22   Sue Lush, FNP    Family History Family History  Problem Relation Age of Onset   Diabetes Mother    Hypertension Mother     Social History Social History   Tobacco Use   Smoking status: Some Days    Current packs/day: 0.00    Types: Cigarettes    Last attempt to quit: 01/22/2020    Years since quitting: 2.8   Smokeless tobacco: Never  Vaping Use   Vaping status: Never Used  Substance Use Topics   Alcohol use: No   Drug use: Not Currently    Types: Marijuana    Comment: 05/06/20     Allergies   Ibuprofen   Review of Systems Review of Systems Pertinent negatives listed in HPI   Physical Exam Triage Vital Signs ED Triage Vitals [11/27/22 1440]  Encounter Vitals Group     BP (!) 148/88     Systolic BP Percentile      Diastolic BP Percentile      Pulse Rate 77     Resp 16     Temp 98.5 F (36.9 C)     Temp Source Oral     SpO2 99 %     Weight      Height      Head Circumference      Peak Flow      Pain Score 4     Pain Loc      Pain Education      Exclude from Growth Chart    No data found.  Updated Vital Signs BP (!) 148/88 (BP Location: Left Arm)   Pulse 77   Temp 98.5 F (36.9 C) (Oral)   Resp 16   LMP 11/17/2022 (Approximate)   SpO2 99%   Visual Acuity Right Eye Distance:   Left Eye Distance:   Bilateral Distance:    Right Eye Near:   Left Eye Near:    Bilateral Near:     Physical Exam Vitals reviewed.  Constitutional:      Appearance: Normal appearance.  HENT:     Head: Normocephalic and atraumatic.     Right Ear: Decreased hearing noted. A middle ear effusion is present.     Left Ear: Hearing, tympanic membrane, ear canal and external ear normal.  Eyes:     Extraocular Movements: Extraocular movements intact.     Pupils: Pupils are equal, round, and reactive to light.   Cardiovascular:     Rate and Rhythm: Normal rate and regular rhythm.  Pulmonary:     Effort: Pulmonary effort is normal.     Breath sounds: Normal  breath sounds.  Skin:    General: Skin is warm and dry.  Neurological:     General: No focal deficit present.     Mental Status: [name] alert.      UC Treatments / Results  Labs (all labs ordered are listed, but only abnormal results are displayed) Labs Reviewed  HIV ANTIBODY (ROUTINE TESTING W REFLEX)  CERVICOVAGINAL ANCILLARY ONLY    EKG   Radiology No results found.  Procedures Procedures (including critical care time)  Medications Ordered in UC Medications - No data to display  Initial Impression / Assessment and Plan / UC Course  I have reviewed the triage vital signs and the nursing notes.  Pertinent labs & imaging results that were available during my care of the patient were reviewed by me and considered in my medical decision making (see chart for details).    Routine STD screening, vaginal cytology self collected screening for Chlamydia, gonorrhea, Trichomonas, HIV and RPR pending.  Evaluation of right ear anatomically no significant findings to explain patient's symptoms.  Patient has a small amount of fluid behind the right ear therefore suspect eustachian tube dysfunction given patient's distribution of pain.  Trial treatment with Xyzal and Flonase.  Return precautions given if symptoms worsen or do not improve.   Addendum: After several attempts unable to have unable to obtain a blood sample to complete HIV and RPR testing.  Patient advised to return at a later time to have blood redrawn Final Clinical Impressions(s) / UC Diagnoses   Final diagnoses:  Screen for STD (sexually transmitted disease)  Acute dysfunction of right eustachian tube     Discharge Instructions      For the waiting your ear I have prescribed levocetirizine and Flonase.  Use as prescribed. Your STD test results will be available  within 24 to 48 hours.  Results will update directly to my chart.  Our office will contact you only if treatment is indicated.     ED Prescriptions     Medication Sig Dispense Auth. Provider   levocetirizine (XYZAL) 5 MG tablet Take 1 tablet (5 mg total) by mouth every evening. 20 tablet Bing Neighbors, NP   fluticasone (FLONASE) 50 MCG/ACT nasal spray Place 2 sprays into both nostrils 2 (two) times daily as needed for allergies or rhinitis. 11.1 mL Bing Neighbors, NP      PDMP not reviewed this encounter.   Bing Neighbors, NP 11/27/22 1532    Bing Neighbors, NP 11/27/22 [phone]

## 2022-11-27 NOTE — ED Triage Notes (Signed)
Also, patient right ear pain.

## 2022-11-28 ENCOUNTER — Telehealth: Payer: Self-pay

## 2022-11-28 LAB — CERVICOVAGINAL ANCILLARY ONLY
Chlamydia: NEGATIVE
Comment: NEGATIVE
Comment: NEGATIVE
Comment: NORMAL
Neisseria Gonorrhea: NEGATIVE
Trichomonas: POSITIVE — AB

## 2022-11-28 MED ORDER — METRONIDAZOLE 500 MG PO TABS: 500.0000 mg | ORAL_TABLET | Freq: Two times a day (BID) | ORAL | 0 refills | Status: AC

## 2022-11-28 NOTE — Telephone Encounter (Signed)
 Per protocol, pt requires tx with metronidazole. Attempted to reach patient x1. LVM. Rx sent to pharmacy on file.

## 2022-11-30 ENCOUNTER — Telehealth: Payer: Self-pay

## 2022-11-30 NOTE — Telephone Encounter (Signed)
RC to pt, ID verified. Reviewed medications administered by UC with pt, states understanding.

## 2022-12-06 ENCOUNTER — Encounter (HOSPITAL_COMMUNITY): Payer: Self-pay

## 2022-12-06 ENCOUNTER — Ambulatory Visit (HOSPITAL_COMMUNITY)
Admission: EM | Admit: 2022-12-06 | Discharge: 2022-12-06 | Disposition: A | Discharge status: Home / Self Care | Payer: MEDICAID | Attending: Emergency Medicine | Admitting: Emergency Medicine

## 2022-12-06 DIAGNOSIS — S46811A Strain of other muscles, fascia and tendons at shoulder and upper arm level, right arm, initial encounter: Secondary | ICD-10-CM

## 2022-12-06 DIAGNOSIS — M546 Pain in thoracic spine: Secondary | ICD-10-CM | POA: Diagnosis not present

## 2022-12-06 MED ORDER — DEXAMETHASONE SODIUM PHOSPHATE 10 MG/ML IJ SOLN
10.0000 mg | Freq: Once | INTRAMUSCULAR | Status: AC
  Administered 2022-12-06: 10 mg via INTRAMUSCULAR

## 2022-12-06 MED ORDER — METHOCARBAMOL 500 MG PO TABS: 500.0000 mg | ORAL_TABLET | Freq: Two times a day (BID) | ORAL | 0 refills | Status: DC

## 2022-12-06 MED ORDER — DEXAMETHASONE SODIUM PHOSPHATE 10 MG/ML IJ SOLN
INTRAMUSCULAR | Status: AC
  Filled 2022-12-06: qty 1
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
## 2022-12-28 ENCOUNTER — Encounter (HOSPITAL_COMMUNITY): Payer: Self-pay

## 2022-12-28 ENCOUNTER — Ambulatory Visit (HOSPITAL_COMMUNITY)
Admission: EM | Admit: 2022-12-28 | Discharge: 2022-12-28 | Disposition: A | Discharge status: Home / Self Care | Payer: MEDICAID | Attending: Internal Medicine | Admitting: Internal Medicine

## 2022-12-28 DIAGNOSIS — Z113 Encounter for screening for infections with a predominantly sexual mode of transmission: Secondary | ICD-10-CM

## 2022-12-28 DIAGNOSIS — Z3202 Encounter for pregnancy test, result negative: Secondary | ICD-10-CM

## 2022-12-28 LAB — POCT URINE PREGNANCY: Preg Test, Ur: NEGATIVE

## 2022-12-28 LAB — HIV ANTIBODY (ROUTINE TESTING W REFLEX): HIV Screen 4th Generation wRfx: NONREACTIVE

## 2022-12-28 NOTE — ED Triage Notes (Signed)
Patient needing routine STD testing. Patient wanting swab and blood work.

## 2022-12-28 NOTE — Discharge Instructions (Signed)
Pregnancy test is negative.  STD testing is pending.

## 2022-12-28 NOTE — ED Provider Notes (Signed)
[facility]    CSN: [id number] Arrival date & time: 12/28/22  1929      History   Chief Complaint Chief Complaint  Patient presents with   SEXUALLY TRANSMITTED DISEASE    HPI Jacqueline Shepherd is a 32 y.o. female.   Patient presents today for STD testing as she tested positive for trichomonas a few weeks prior and was treated for it.  She wants to be retested to ensure infection is cleared.  [name] denying any symptoms at this time.  Last menstrual cycle was 11/28/2022.  She does not use any form of birth control.  Denies any new exposure to STD.     Past Medical History:  Diagnosis Date   Abdominal pain    Cannabinoid hyperemesis syndrome    Chlamydia infection complicating pregnancy in second trimester 08/18/2018   Negative test of cure on 08/06/18   Transient hypertension of pregnancy 10/12/2019   Weight loss 10/12/2019    Patient Active Problem List   Diagnosis Date Noted   Intractable nausea and vomiting 04/17/2022   Hypokalemia 04/17/2022   Cannabis hyperemesis syndrome concurrent with and due to cannabis abuse (HCC) 06/04/2020   IUD (intrauterine device) in place 05/15/2020   Fetal malpresentation 05/14/2020   Preeclampsia 05/10/2020   Antepartum mild preeclampsia 05/07/2020   Carpal tunnel syndrome during pregnancy 04/15/2020   Gestational diabetes mellitus (GDM), antepartum 04/13/2020   Dysuria during pregnancy in second trimester 02/23/2020   Abnormal TSH 12/09/2019   Hyperemesis gravidarum, antepartum 11/22/2019   Tobacco use in pregnancy, antepartum, first trimester 11/11/2019   Other social stressor 11/11/2019   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   Obesity in pregnancy 10/12/2019   BMI 40.0-44.9, adult (HCC) 10/12/2019   Cannabinoid hyperemesis syndrome    Chlamydia infection affecting pregnancy in second trimester 08/18/2018   Rubella non-immune status, antepartum 08/18/2018    Past Surgical History:  Procedure Laterality  Date   CESAREAN SECTION N/A 05/14/2020   Procedure: CESAREAN SECTION;  Surgeon: Conan Bowens, MD;  Location: [facility];  Service: Obstetrics;  Laterality: N/A;  Primary C/S malpresentation & IUD placement   WISDOM TOOTH EXTRACTION      OB History     Gravida  2   Para  1   Term  1   Preterm      AB  1   Living  1      SAB  1   IAB      Ectopic      Multiple  0   Live Births  1            Home Medications    Prior to Admission medications   Medication Sig Start Date End Date Taking? Authorizing Provider  buPROPion (WELLBUTRIN SR) 150 MG 12 hr tablet Take 1 tablet (150 mg total) by mouth at bedtime. Patient not taking: Reported on 04/20/2022 08/27/21 08/27/22  Bobbitt, Franchot Mimes, NP  dicyclomine (BENTYL) 20 MG tablet Take 1 tablet (20 mg total) by mouth 2 (two) times daily. Patient not taking: Reported on 07/03/2022 06/10/22   Sabas Sous, MD  fluticasone [facility]) 50 MCG/ACT nasal spray Place 2 sprays into both nostrils 2 (two) times daily as needed for allergies or rhinitis. 11/27/22   Bing Neighbors, NP  levocetirizine (XYZAL) 5 MG tablet Take 1 tablet (5 mg total) by mouth every evening. 11/27/22   Bing Neighbors, NP  methocarbamol (ROBAXIN) 500 MG tablet Take 1 tablet (500 mg  total) by mouth 2 (two) times daily. 12/06/22   Garrison, [place] N, FNP  metoCLOPramide (REGLAN) 10 MG tablet Take 1 tablet (10 mg total) by mouth every 6 (six) hours. 08/02/22   Roxy Horseman, PA-C  ondansetron (ZOFRAN) 4 MG tablet Take 1 tablet (4 mg total) by mouth every 6 (six) hours. Patient not taking: Reported on 07/03/2022 04/17/22   Cristopher Peru, PA-C  ondansetron (ZOFRAN-ODT) 4 MG disintegrating tablet Take 1 tablet (4 mg total) by mouth every 8 (eight) hours as needed for nausea or vomiting. Patient not taking: Reported on 07/03/2022 06/10/22   Sabas Sous, MD  pantoprazole (PROTONIX) 40 MG tablet Take 1 tablet (40 mg total) by mouth daily. Patient not taking:  Reported on 07/03/2022 04/20/22 04/20/23  Burnadette Pop, MD  Potassium Chloride ER 20 MEQ TBCR Take 2 tablets (40 mEq total) by mouth daily for 5 days. 04/20/22 04/25/22  Burnadette Pop, MD  Prenatal 28-0.8 MG TABS Take 1 tablet by mouth daily. 07/03/22   Sue Lush, FNP    Family History Family History  Problem Relation Age of Onset   Diabetes Mother    Hypertension Mother     Social History Social History   Tobacco Use   Smoking status: Some Days    Current packs/day: 0.00    Types: Cigarettes    Last attempt to quit: 01/22/2020    Years since quitting: 2.9   Smokeless tobacco: Never  Vaping Use   Vaping status: Never Used  Substance Use Topics   Alcohol use: No   Drug use: Not Currently    Types: Marijuana    Comment: 05/06/20     Allergies   Ibuprofen   Review of Systems Review of Systems Per HPI  Physical Exam Triage Vital Signs ED Triage Vitals  Encounter Vitals Group     BP 12/28/22 2003 121/77     Systolic BP Percentile --      Diastolic BP Percentile --      Pulse Rate 12/28/22 2003 78     Resp 12/28/22 2003 16     Temp 12/28/22 2003 98.5 F (36.9 C)     Temp Source 12/28/22 2003 Oral     SpO2 12/28/22 2003 98 %     Weight 12/28/22 2003 (!) 350 lb 1.5 oz (158.8 kg)     Height 12/28/22 2003 5\' 7"  (1.702 m)     Head Circumference --      Peak Flow --      Pain Score 12/28/22 2002 0     Pain Loc --      Pain Education --      Exclude from Growth Chart --    No data found.  Updated Vital Signs BP 121/77 (BP Location: Right Wrist)   Pulse 78   Temp 98.5 F (36.9 C) (Oral)   Resp 16   Ht 5\' 7"  (1.702 m)   Wt (!) 350 lb 1.5 oz (158.8 kg)   LMP 11/28/2022 (Approximate)   SpO2 98%   BMI 54.83 kg/m   Visual Acuity Right Eye Distance:   Left Eye Distance:   Bilateral Distance:    Right Eye Near:   Left Eye Near:    Bilateral Near:     Physical Exam Constitutional:      General: [name] not in acute distress.    Appearance: Normal  appearance. [name] not toxic-appearing or diaphoretic.  HENT:     Head: Normocephalic and atraumatic.  Eyes:  Extraocular Movements: Extraocular movements intact.     Conjunctiva/sclera: Conjunctivae normal.  Pulmonary:     Effort: Pulmonary effort is normal.  Genitourinary:    Comments: Deferred with shared decision making. Self swab performed.  Neurological:     General: No focal deficit present.     Mental Status: [name] alert and oriented to person, place, and time. Mental status is at baseline.  Psychiatric:        Mood and Affect: Mood normal.        Behavior: Behavior normal.        Thought Content: Thought content normal.        Judgment: Judgment normal.      UC Treatments / Results  Labs (all labs ordered are listed, but only abnormal results are displayed) Labs Reviewed  RPR  HIV ANTIBODY (ROUTINE TESTING W REFLEX)  POCT URINE PREGNANCY  CERVICOVAGINAL ANCILLARY ONLY    EKG   Radiology No results found.  Procedures Procedures (including critical care time)  Medications Ordered in UC Medications - No data to display  Initial Impression / Assessment and Plan / UC Course  I have reviewed the triage vital signs and the nursing notes.  Pertinent labs & imaging results that were available during my care of the patient were reviewed by me and considered in my medical decision making (see chart for details).     Cervicovaginal swab, HIV, RPR test pending.  Awaiting results.  Advised strict follow-up precautions.  Patient verbalized understanding and was agreeable with plan. Final Clinical Impressions(s) / UC Diagnoses   Final diagnoses:  Screening examination for venereal disease  Urine pregnancy test negative     Discharge Instructions      Pregnancy test is negative.  STD testing is pending.     ED Prescriptions   None    PDMP not reviewed this encounter.   Gustavus Bryant, [place] 12/28/22 2046

## 2022-12-29 LAB — CERVICOVAGINAL ANCILLARY ONLY
Chlamydia: NEGATIVE
Comment: NEGATIVE
Comment: NEGATIVE
Comment: NORMAL
Neisseria Gonorrhea: NEGATIVE
Trichomonas: NEGATIVE

## 2022-12-29 LAB — RPR: RPR Ser Ql: NONREACTIVE

## 2023-01-31 ENCOUNTER — Ambulatory Visit (HOSPITAL_COMMUNITY)
Admission: EM | Admit: 2023-01-31 | Discharge: 2023-01-31 | Disposition: A | Discharge status: Home / Self Care | Payer: MEDICAID | Attending: Family Medicine | Admitting: Family Medicine

## 2023-01-31 ENCOUNTER — Encounter (HOSPITAL_COMMUNITY): Payer: Self-pay

## 2023-01-31 DIAGNOSIS — Z113 Encounter for screening for infections with a predominantly sexual mode of transmission: Secondary | ICD-10-CM

## 2023-01-31 NOTE — ED Provider Notes (Signed)
 [facility]   [id number] 01/31/23 Arrival Time: 1130  ASSESSMENT & PLAN:  1. Screening for STDs (sexually transmitted diseases)    Vaginal cytology pending. Without s/s of PID.  Will notify of any positive results.  Labs Reviewed  CERVICOVAGINAL ANCILLARY ONLY    Reviewed expectations re: course of current medical issues. Questions answered. Outlined signs and symptoms indicating need for more acute intervention. Patient verbalized understanding. After Visit Summary given.   SUBJECTIVE:  Jacqueline Shepherd is a 33 y.o. female who requests STI testing. Just don't feel right down there. Denies vaginal discharge, fever, abd/pelvic pain.  Patient's last menstrual period was 01/17/2023.   OBJECTIVE:  Vitals:   01/31/23 1303  BP: 105/73  Pulse: 75  Resp: 16  Temp: 98.5 F (36.9 C)  TempSrc: Oral  SpO2: 98%     General appearance: alert, cooperative, appears stated age and no distress GU: deferred Psychological: alert and cooperative; normal mood and affect.    Labs Reviewed - No data to display  Allergies  Allergen Reactions   Ibuprofen  Anaphylaxis    Past Medical History:  Diagnosis Date   Abdominal pain    Cannabinoid hyperemesis syndrome    Chlamydia infection complicating pregnancy in second trimester 08/18/2018   Negative test of cure on 08/06/18   Transient hypertension of pregnancy 10/12/2019   Weight loss 10/12/2019   Family History  Problem Relation Age of Onset   Diabetes Mother    Hypertension Mother    Social History   Socioeconomic History   Marital status: Single    Spouse name: Not on file   Number of children: Not on file   Years of education: Not on file   Highest education level: Not on file  Occupational History   Not on file  Tobacco Use   Smoking status: Some Days    Current packs/day: 0.00    Types: Cigarettes    Last attempt to quit: 01/22/2020    Years since quitting: 3.0   Smokeless tobacco: Never   Vaping Use   Vaping status: Never Used  Substance and Sexual Activity   Alcohol use: No   Drug use: Not Currently    Types: Marijuana    Comment: 05/06/20   Sexual activity: Yes    Partners: Male    Birth control/protection: None    Comment: Pregnant   Other Topics Concern   Not on file  Social History Narrative   Not on file   Social Drivers of Health   Financial Resource Strain: High Risk (08/05/2020)   Overall Financial Resource Strain (CARDIA)    Difficulty of Paying Living Expenses: Very hard  Food Insecurity: No Food Insecurity (04/17/2022)   Hunger Vital Sign    Worried About Running Out of Food in the Last Year: Never true    Ran Out of Food in the Last Year: Never true  Transportation Needs: No Transportation Needs (04/17/2022)   PRAPARE - Administrator, Civil Service (Medical): No    Lack of Transportation (Non-Medical): No  Physical Activity: Not on file  Stress: Stress Concern Present (08/05/2020)   [employer] of Occupational Health - Occupational Stress Questionnaire    Feeling of Stress : Very much  Social Connections: Unknown (05/31/2021)   Received from [facility]   Social Network    Social Network: Not on file  Intimate Partner Violence: Not At Risk (04/17/2022)   Humiliation, Afraid, Rape, and Kick questionnaire    Fear of  Current or Ex-Partner: No    Emotionally Abused: No    Physically Abused: No    Sexually Abused: No           Rolinda Rogue, MD 01/31/23 1313

## 2023-01-31 NOTE — ED Triage Notes (Signed)
 Patient is requesting STD testing. Patient states, "I just don't feel right down there." Patient denies any vaginal discharge, lower abdominal pain UTI symptoms.

## 2023-02-01 ENCOUNTER — Ambulatory Visit (HOSPITAL_COMMUNITY)
Admission: EM | Admit: 2023-02-01 | Discharge: 2023-02-01 | Disposition: A | Discharge status: Home / Self Care | Payer: MEDICAID | Attending: Family Medicine | Admitting: Family Medicine

## 2023-02-01 ENCOUNTER — Telehealth (HOSPITAL_COMMUNITY): Payer: Self-pay

## 2023-02-01 DIAGNOSIS — Z113 Encounter for screening for infections with a predominantly sexual mode of transmission: Secondary | ICD-10-CM | POA: Insufficient documentation

## 2023-02-01 LAB — CERVICOVAGINAL ANCILLARY ONLY
Comment: NEGATIVE
Comment: NEGATIVE
Comment: NEGATIVE
Comment: NEGATIVE
Comment: NEGATIVE
Comment: NORMAL

## 2023-02-01 NOTE — ED Notes (Signed)
 Patient needed to recollect cyto swab. Cyto has been collected and sent to the lab.

## 2023-02-01 NOTE — Telephone Encounter (Signed)
 Pt to return to UC for Nurse Visit for recollection of cervicovaginal cytology d/t insufficient material in last sample.  Pt verbalized understanding.

## 2023-02-02 LAB — CERVICOVAGINAL ANCILLARY ONLY
Bacterial Vaginitis (gardnerella): POSITIVE — AB
Candida Glabrata: NEGATIVE
Candida Vaginitis: POSITIVE — AB
Chlamydia: NEGATIVE
Comment: NEGATIVE
Comment: NEGATIVE
Comment: NEGATIVE
Comment: NEGATIVE
Comment: NEGATIVE
Comment: NORMAL
Neisseria Gonorrhea: NEGATIVE
Trichomonas: NEGATIVE

## 2023-02-05 ENCOUNTER — Telehealth (HOSPITAL_BASED_OUTPATIENT_CLINIC_OR_DEPARTMENT_OTHER): Payer: Self-pay

## 2023-02-05 MED ORDER — METRONIDAZOLE 500 MG PO TABS
500.0000 mg | ORAL_TABLET | Freq: Two times a day (BID) | ORAL | 0 refills | Status: AC
Start: 2023-02-05 — End: 2023-02-12

## 2023-02-05 MED ORDER — FLUCONAZOLE 150 MG PO TABS: 150.0000 mg | ORAL_TABLET | Freq: Once | ORAL | 0 refills | Status: AC

## 2023-02-05 NOTE — Telephone Encounter (Signed)
 Per protocol, pt requires tx with metronidazole and Diflucan. Attempted to reach patient x1. LVM. Rx sent to pharmacy on file.

## 2023-04-03 ENCOUNTER — Other Ambulatory Visit (HOSPITAL_COMMUNITY): Payer: Self-pay

## 2023-04-10 ENCOUNTER — Ambulatory Visit (HOSPITAL_COMMUNITY)
Admission: RE | Admit: 2023-04-10 | Discharge: 2023-04-10 | Disposition: A | Discharge status: Home / Self Care | Payer: MEDICAID | Source: Ambulatory Visit | Attending: Internal Medicine | Admitting: Internal Medicine

## 2023-04-10 ENCOUNTER — Other Ambulatory Visit: Payer: Self-pay

## 2023-04-10 ENCOUNTER — Encounter (HOSPITAL_COMMUNITY): Payer: Self-pay

## 2023-04-10 VITALS — BP 114/69 | HR 63 | Temp 98.7°F | Resp 20

## 2023-04-10 DIAGNOSIS — N926 Irregular menstruation, unspecified: Secondary | ICD-10-CM | POA: Diagnosis present

## 2023-04-10 DIAGNOSIS — R112 Nausea with vomiting, unspecified: Secondary | ICD-10-CM

## 2023-04-10 LAB — HCG, SERUM, QUALITATIVE: Preg, Serum: NEGATIVE

## 2023-04-10 NOTE — ED Provider Notes (Signed)
 [facility]    CSN: [id number] Arrival date & time: 04/10/23  1319      History   Chief Complaint No chief complaint on file.   HPI Jacqueline Shepherd is a 33 y.o. female who feels she is pregnant. Has been vomiting every morning 1-3 times, and is fine the rest of the day. Had lighter period on 3/4, but came on time. Denies diarrhea or UTI symptoms.     Past Medical History:  Diagnosis Date   Abdominal pain    Cannabinoid hyperemesis syndrome    Chlamydia infection complicating pregnancy in second trimester 08/18/2018   Negative test of cure on 08/06/18   Transient hypertension of pregnancy 10/12/2019   Weight loss 10/12/2019    Patient Active Problem List   Diagnosis Date Noted   Intractable nausea and vomiting 04/17/2022   Hypokalemia 04/17/2022   Cannabis hyperemesis syndrome concurrent with and due to cannabis abuse (HCC) 06/04/2020   IUD (intrauterine device) in place 05/15/2020   Fetal malpresentation 05/14/2020   Preeclampsia 05/10/2020   Antepartum mild preeclampsia 05/07/2020   Carpal tunnel syndrome during pregnancy 04/15/2020   Gestational diabetes mellitus (GDM), antepartum 04/13/2020   Dysuria during pregnancy in second trimester 02/23/2020   Abnormal TSH 12/09/2019   Hyperemesis gravidarum, antepartum 11/22/2019   Tobacco use in pregnancy, antepartum, first trimester 11/11/2019   Other social stressor 11/11/2019   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   Obesity in pregnancy 10/12/2019   BMI 40.0-44.9, adult (HCC) 10/12/2019   Cannabinoid hyperemesis syndrome    Chlamydia infection affecting pregnancy in second trimester 08/18/2018   Rubella non-immune status, antepartum 08/18/2018    Past Surgical History:  Procedure Laterality Date   CESAREAN SECTION N/A 05/14/2020   Procedure: CESAREAN SECTION;  Surgeon: Conan Bowens, MD;  Location: [facility];  Service: Obstetrics;  Laterality: N/A;  Primary C/S malpresentation & IUD  placement   WISDOM TOOTH EXTRACTION      OB History     Gravida  2   Para  1   Term  1   Preterm      AB  1   Living  1      SAB  1   IAB      Ectopic      Multiple  0   Live Births  1            Home Medications    Prior to Admission medications   Not on File    Family History Family History  Problem Relation Age of Onset   Diabetes Mother    Hypertension Mother     Social History Social History   Tobacco Use   Smoking status: Some Days    Current packs/day: 0.00    Types: Cigarettes    Last attempt to quit: 01/22/2020    Years since quitting: 3.2   Smokeless tobacco: Never  Vaping Use   Vaping status: Never Used  Substance Use Topics   Alcohol use: Yes   Drug use: Not Currently    Types: Marijuana    Comment: 05/06/20     Allergies   Ibuprofen   Review of Systems Review of Systems As noted  in HPI  Physical Exam Triage Vital Signs ED Triage Vitals  Encounter Vitals Group     BP 04/10/23 1352 114/69     Systolic BP Percentile --      Diastolic BP Percentile --      Pulse Rate 04/10/23  1352 63     Resp 04/10/23 1352 20     Temp 04/10/23 1352 98.7 F (37.1 C)     Temp Source 04/10/23 1352 Oral     SpO2 04/10/23 1352 97 %     Weight --      Height --      Head Circumference --      Peak Flow --      Pain Score 04/10/23 1349 0     Pain Loc --      Pain Education --      Exclude from Growth Chart --    No data found.  Updated Vital Signs BP 114/69 (BP Location: Left Arm) Comment (BP Location): large cuff  Pulse 63   Temp 98.7 F (37.1 C) (Oral)   Resp 20   LMP 03/29/2023   SpO2 97%   Visual Acuity Right Eye Distance:   Left Eye Distance:   Bilateral Distance:    Right Eye Near:   Left Eye Near:    Bilateral Near:     Physical Exam Vitals and nursing note reviewed.  Constitutional:      General: [name] not in acute distress.    Appearance: [name] obese. [name] not toxic-appearing.  Eyes:      General: No scleral icterus.    Conjunctiva/sclera: Conjunctivae normal.  Pulmonary:     Effort: Pulmonary effort is normal.  Abdominal:     Palpations: Abdomen is soft. There is no mass.     Tenderness: There is no abdominal tenderness. There is no guarding or rebound.  Musculoskeletal:     Cervical back: Neck supple.  Skin:    General: Skin is warm and dry.     Findings: No rash.  Neurological:     Mental Status: [name] alert and oriented to person, place, and time.     Gait: Gait normal.  Psychiatric:        Mood and Affect: Mood normal.        Behavior: Behavior normal.        Thought Content: Thought content normal.        Judgment: Judgment normal.      UC Treatments / Results  Labs (all labs ordered are listed, but only abnormal results are displayed) Labs Reviewed  HCG, SERUM, QUALITATIVE    EKG   Radiology No results found.  Procedures Procedures (including critical care time)  Medications Ordered in UC Medications - No data to display  Initial Impression / Assessment and Plan / UC Course  I have reviewed the triage vital signs and the nursing notes.  Early am Nausea and vomiting   Serum pregnancy test ordered and we will inform her if the results are positive.  Final Clinical Impressions(s) / UC Diagnoses   Final diagnoses:  Nausea and vomiting, unspecified vomiting type  Abnormal menstrual periods     Discharge Instructions      We will inform you when the pregnancy test is back if positive. Otherwise you will be able to see it on Mychart     ED Prescriptions   None    PDMP not reviewed this encounter.   Garey Ham, [place] 04/10/23 1405

## 2023-04-10 NOTE — Discharge Instructions (Signed)
 We will inform you when the pregnancy test is back if positive. Otherwise you will be able to see it on Mychart

## 2023-04-10 NOTE — ED Triage Notes (Signed)
 Lmp 3/4-3/6, since 3/7 has been vomiting, light headed, "I know my body"  patient wants blood test for pregnancy.  Patient reports home pregnancy tests are negative

## 2023-06-20 ENCOUNTER — Encounter (HOSPITAL_COMMUNITY): Payer: Self-pay

## 2023-06-20 ENCOUNTER — Ambulatory Visit (HOSPITAL_COMMUNITY)
Admission: EM | Admit: 2023-06-20 | Discharge: 2023-06-20 | Disposition: A | Discharge status: Home / Self Care | Payer: MEDICAID | Attending: Family Medicine | Admitting: Family Medicine

## 2023-06-20 DIAGNOSIS — Z113 Encounter for screening for infections with a predominantly sexual mode of transmission: Secondary | ICD-10-CM | POA: Insufficient documentation

## 2023-06-20 DIAGNOSIS — Z3202 Encounter for pregnancy test, result negative: Secondary | ICD-10-CM | POA: Diagnosis present

## 2023-06-20 LAB — HIV ANTIBODY (ROUTINE TESTING W REFLEX): HIV Screen 4th Generation wRfx: NONREACTIVE

## 2023-06-20 LAB — POCT URINE PREGNANCY: Preg Test, Ur: NEGATIVE

## 2023-06-20 NOTE — Discharge Instructions (Signed)
 Your pregnancy test is negative. Your results will come back over the next few days and someone will call if results are positive and require treatment.  Return here as needed.

## 2023-06-20 NOTE — ED Triage Notes (Signed)
 Pt states she want to be tested for STD's and a pregnancy test.

## 2023-06-20 NOTE — ED Provider Notes (Signed)
 [facility]    CSN: [id number] Arrival date & time: 06/20/23  1940      History   Chief Complaint Chief Complaint  Patient presents with   Exposure to STD    HPI Jacqueline Shepherd is a 33 y.o. female.   Patient presents requesting STD and pregnancy testing.  Patient reports recent unprotected sexual intercourse.  Patient denies abnormal vaginal discharge, vaginal pain, abdominal pain, flank pain, fever, dysuria, hematuria, and urinary frequency/urgency.  Patient denies any known exposures to STDs.  LMP 06/14/2023.  The history is provided by the patient and medical records.  Exposure to STD    Past Medical History:  Diagnosis Date   Abdominal pain    Cannabinoid hyperemesis syndrome    Chlamydia infection complicating pregnancy in second trimester 08/18/2018   Negative test of cure on 08/06/18   Transient hypertension of pregnancy 10/12/2019   Weight loss 10/12/2019    Patient Active Problem List   Diagnosis Date Noted   Intractable nausea and vomiting 04/17/2022   Hypokalemia 04/17/2022   Cannabis hyperemesis syndrome concurrent with and due to cannabis abuse (HCC) 06/04/2020   IUD (intrauterine device) in place 05/15/2020   Fetal malpresentation 05/14/2020   Preeclampsia 05/10/2020   Antepartum mild preeclampsia 05/07/2020   Carpal tunnel syndrome during pregnancy 04/15/2020   Gestational diabetes mellitus (GDM), antepartum 04/13/2020   Dysuria during pregnancy in second trimester 02/23/2020   Abnormal TSH 12/09/2019   Hyperemesis gravidarum, antepartum 11/22/2019   Tobacco use in pregnancy, antepartum, first trimester 11/11/2019   Other social stressor 11/11/2019   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   Obesity in pregnancy 10/12/2019   BMI 40.0-44.9, adult (HCC) 10/12/2019   Cannabinoid hyperemesis syndrome    Chlamydia infection affecting pregnancy in second trimester 08/18/2018   Rubella non-immune status, antepartum 08/18/2018     Past Surgical History:  Procedure Laterality Date   CESAREAN SECTION N/A 05/14/2020   Procedure: CESAREAN SECTION;  Surgeon: Jan Mcgill, MD;  Location: [facility];  Service: Obstetrics;  Laterality: N/A;  Primary C/S malpresentation & IUD placement   WISDOM TOOTH EXTRACTION      OB History     Gravida  2   Para  1   Term  1   Preterm      AB  1   Living  1      SAB  1   IAB      Ectopic      Multiple  0   Live Births  1            Home Medications    Prior to Admission medications   Not on File    Family History Family History  Problem Relation Age of Onset   Diabetes Mother    Hypertension Mother     Social History Social History   Tobacco Use   Smoking status: Some Days    Current packs/day: 0.00    Types: Cigarettes    Last attempt to quit: 01/22/2020    Years since quitting: 3.4   Smokeless tobacco: Never  Vaping Use   Vaping status: Never Used  Substance Use Topics   Alcohol use: Yes   Drug use: Not Currently    Types: Marijuana    Comment: 05/06/20     Allergies   Ibuprofen    Review of Systems Review of Systems  Per HPI  Physical Exam Triage Vital Signs ED Triage Vitals  Encounter Vitals Group  BP 06/20/23 2026 116/77     Systolic BP Percentile --      Diastolic BP Percentile --      Pulse Rate 06/20/23 2026 64     Resp 06/20/23 2026 16     Temp 06/20/23 2026 98.1 F (36.7 C)     Temp Source 06/20/23 2026 Oral     SpO2 06/20/23 2026 99 %     Weight --      Height --      Head Circumference --      Peak Flow --      Pain Score 06/20/23 2025 0     Pain Loc --      Pain Education --      Exclude from Growth Chart --    No data found.  Updated Vital Signs BP 116/77 (BP Location: Right Arm)   Pulse 64   Temp 98.1 F (36.7 C) (Oral)   Resp 16   LMP 06/14/2023 (Exact Date)   SpO2 99%   Visual Acuity Right Eye Distance:   Left Eye Distance:   Bilateral Distance:    Right Eye Near:   Left  Eye Near:    Bilateral Near:     Physical Exam Vitals and nursing note reviewed.  Constitutional:      General: [name] awake. [name] not in acute distress.    Appearance: Normal appearance. [name] well-developed and well-groomed. [name] not ill-appearing.  Genitourinary:    Comments: Exam deferred Neurological:     Mental Status: [name] alert.  Psychiatric:        Behavior: Behavior is cooperative.      UC Treatments / Results  Labs (all labs ordered are listed, but only abnormal results are displayed) Labs Reviewed  HIV ANTIBODY (ROUTINE TESTING W REFLEX)  RPR  POCT URINE PREGNANCY  CERVICOVAGINAL ANCILLARY ONLY    EKG   Radiology No results found.  Procedures Procedures (including critical care time)  Medications Ordered in UC Medications - No data to display  Initial Impression / Assessment and Plan / UC Course  I have reviewed the triage vital signs and the nursing notes.  Pertinent labs & imaging results that were available during my care of the patient were reviewed by me and considered in my medical decision making (see chart for details).     Patient is well-appearing.  Vitals are stable.  GU exam deferred.  Patient performed self swab for STD/STI HIV and RPR ordered.  Urine pregnancy negative.  Discussed follow-up and return precautions. Final Clinical Impressions(s) / UC Diagnoses   Final diagnoses:  Screening for STD (sexually transmitted disease)  Negative pregnancy test     Discharge Instructions      Your pregnancy test is negative. Your results will come back over the next few days and someone will call if results are positive and require treatment.  Return here as needed.    ED Prescriptions   None    PDMP not reviewed this encounter.   Levora Reas A, NP 06/20/23 2038

## 2023-06-21 ENCOUNTER — Ambulatory Visit (HOSPITAL_COMMUNITY): Payer: Self-pay

## 2023-06-21 LAB — CERVICOVAGINAL ANCILLARY ONLY
Bacterial Vaginitis (gardnerella): POSITIVE — AB
Candida Glabrata: NEGATIVE
Candida Vaginitis: NEGATIVE
Chlamydia: NEGATIVE
Comment: NEGATIVE
Comment: NEGATIVE
Comment: NEGATIVE
Comment: NEGATIVE
Comment: NEGATIVE
Comment: NORMAL
Neisseria Gonorrhea: NEGATIVE
Trichomonas: NEGATIVE

## 2023-06-21 LAB — RPR: RPR Ser Ql: NONREACTIVE

## 2023-07-18 ENCOUNTER — Encounter (HOSPITAL_COMMUNITY): Payer: Self-pay

## 2023-07-18 ENCOUNTER — Ambulatory Visit (HOSPITAL_COMMUNITY)
Admission: EM | Admit: 2023-07-18 | Discharge: 2023-07-18 | Disposition: A | Discharge status: Home / Self Care | Payer: MEDICAID | Attending: Internal Medicine | Admitting: Internal Medicine

## 2023-07-18 ENCOUNTER — Ambulatory Visit: Payer: MEDICAID

## 2023-07-18 DIAGNOSIS — Z113 Encounter for screening for infections with a predominantly sexual mode of transmission: Secondary | ICD-10-CM | POA: Diagnosis present

## 2023-07-18 LAB — POCT URINE PREGNANCY: Preg Test, Ur: NEGATIVE

## 2023-07-18 NOTE — ED Provider Notes (Signed)
 [facility]    CSN: [id number] Arrival date & time: 07/18/23  1940      History   Chief Complaint Chief Complaint  Patient presents with   SEXUALLY TRANSMITTED DISEASE    HPI Jacqueline Shepherd is a 33 y.o. female.   33 year old female who presents to urgent care requesting STD testing.  She also is requesting pregnancy test.  She denies any vaginal symptoms, vaginal pain vaginal discharge, vaginal irritation.  Her last period was May 24.  She denies any other constitutional symptoms.     Past Medical History:  Diagnosis Date   Abdominal pain    Cannabinoid hyperemesis syndrome    Chlamydia infection complicating pregnancy in second trimester 08/18/2018   Negative test of cure on 08/06/18   Transient hypertension of pregnancy 10/12/2019   Weight loss 10/12/2019    Patient Active Problem List   Diagnosis Date Noted   Intractable nausea and vomiting 04/17/2022   Hypokalemia 04/17/2022   Cannabis hyperemesis syndrome concurrent with and due to cannabis abuse (HCC) 06/04/2020   IUD (intrauterine device) in place 05/15/2020   Fetal malpresentation 05/14/2020   Preeclampsia 05/10/2020   Antepartum mild preeclampsia 05/07/2020   Carpal tunnel syndrome during pregnancy 04/15/2020   Gestational diabetes mellitus (GDM), antepartum 04/13/2020   Dysuria during pregnancy in second trimester 02/23/2020   Abnormal TSH 12/09/2019   Hyperemesis gravidarum, antepartum 11/22/2019   Tobacco use in pregnancy, antepartum, first trimester 11/11/2019   Other social stressor 11/11/2019   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   Obesity in pregnancy 10/12/2019   BMI 40.0-44.9, adult (HCC) 10/12/2019   Cannabinoid hyperemesis syndrome    Chlamydia infection affecting pregnancy in second trimester 08/18/2018   Rubella non-immune status, antepartum 08/18/2018    Past Surgical History:  Procedure Laterality Date   CESAREAN SECTION N/A 05/14/2020   Procedure: CESAREAN  SECTION;  Surgeon: Nicholaus Burnard CHRISTELLA, MD;  Location: [facility];  Service: Obstetrics;  Laterality: N/A;  Primary C/S malpresentation & IUD placement   WISDOM TOOTH EXTRACTION      OB History     Gravida  2   Para  1   Term  1   Preterm      AB  1   Living  1      SAB  1   IAB      Ectopic      Multiple  0   Live Births  1            Home Medications    Prior to Admission medications   Not on File    Family History Family History  Problem Relation Age of Onset   Diabetes Mother    Hypertension Mother     Social History Social History   Tobacco Use   Smoking status: Former    Current packs/day: 0.00    Types: Cigarettes    Quit date: 01/22/2020    Years since quitting: 3.4   Smokeless tobacco: Never  Vaping Use   Vaping status: Never Used  Substance Use Topics   Alcohol use: Yes   Drug use: Not Currently    Types: Marijuana    Comment: 05/06/20     Allergies   Ibuprofen    Review of Systems Review of Systems  Constitutional:  Negative for chills and fever.  HENT:  Negative for ear pain and sore throat.   Eyes:  Negative for pain and visual disturbance.  Respiratory:  Negative for cough and shortness  of breath.   Cardiovascular:  Negative for chest pain and palpitations.  Gastrointestinal:  Negative for abdominal pain and vomiting.  Genitourinary:  Negative for dysuria and hematuria.  Musculoskeletal:  Negative for arthralgias and back pain.  Skin:  Negative for color change and rash.  Neurological:  Negative for seizures and syncope.  All other systems reviewed and are negative.    Physical Exam Triage Vital Signs ED Triage Vitals [07/18/23 2050]  Encounter Vitals Group     BP 129/88     Girls Systolic BP Percentile      Girls Diastolic BP Percentile      Boys Systolic BP Percentile      Boys Diastolic BP Percentile      Pulse Rate 69     Resp 16     Temp 98.2 F (36.8 C)     Temp Source Oral     SpO2 98 %     Weight       Height      Head Circumference      Peak Flow      Pain Score      Pain Loc      Pain Education      Exclude from Growth Chart    No data found.  Updated Vital Signs BP 129/88 (BP Location: Right Arm)   Pulse 69   Temp 98.2 F (36.8 C) (Oral)   Resp 16   LMP 06/16/2023 (Exact Date)   SpO2 98%   Visual Acuity Right Eye Distance:   Left Eye Distance:   Bilateral Distance:    Right Eye Near:   Left Eye Near:    Bilateral Near:     Physical Exam Vitals and nursing note reviewed.  Constitutional:      General: [name] not in acute distress.    Appearance: [name] well-developed.  HENT:     Head: Normocephalic and atraumatic.   Eyes:     Conjunctiva/sclera: Conjunctivae normal.    Cardiovascular:     Rate and Rhythm: Normal rate and regular rhythm.  Pulmonary:     Effort: Pulmonary effort is normal. No respiratory distress.     Breath sounds: Normal breath sounds.  Abdominal:     Palpations: Abdomen is soft.     Tenderness: There is no abdominal tenderness.   Musculoskeletal:        General: No swelling.     Cervical back: Neck supple.   Skin:    General: Skin is warm and dry.     Capillary Refill: Capillary refill takes less than 2 seconds.   Neurological:     Mental Status: [name] alert.   Psychiatric:        Mood and Affect: Mood normal.      UC Treatments / Results  Labs (all labs ordered are listed, but only abnormal results are displayed) Labs Reviewed  POCT URINE PREGNANCY - Normal  CERVICOVAGINAL ANCILLARY ONLY    EKG   Radiology No results found.  Procedures Procedures (including critical care time)  Medications Ordered in UC Medications - No data to display  Initial Impression / Assessment and Plan / UC Course  I have reviewed the triage vital signs and the nursing notes.  Pertinent labs & imaging results that were available during my care of the patient were reviewed by me and considered in my medical decision making (see  chart for details).     Screening examination for STI   Urine pregnancy test  done today and this was negative. Screening swab done today and results will be available in 24-48 hours. We will contact you if we need to arrange additional treatment based on your testing. Negative results will be on your MyChart account. Abstain from sex until you receive your final results.  Use a condom for sexual encounters. If you have any worsening or changing symptoms including abnormal discharge, pelvic pain, abdominal pain, fever, nausea, or vomiting, then you should be reevaluated.    Final Clinical Impressions(s) / UC Diagnoses   Final diagnoses:  Screening examination for STI     Discharge Instructions      Urine pregnancy test done today and this was negative. Screening swab done today and results will be available in 24-48 hours. We will contact you if we need to arrange additional treatment based on your testing. Negative results will be on your MyChart account. Abstain from sex until you receive your final results.  Use a condom for sexual encounters. If you have any worsening or changing symptoms including abnormal discharge, pelvic pain, abdominal pain, fever, nausea, or vomiting, then you should be reevaluated.      ED Prescriptions   None    PDMP not reviewed this encounter.   Teresa Almarie LABOR, [place] 07/18/23 2108

## 2023-07-18 NOTE — ED Triage Notes (Signed)
 Patient is requesting STD testing and wants a pregnancy test.

## 2023-07-18 NOTE — Discharge Instructions (Addendum)
 Urine pregnancy test done today and this was negative. Screening swab done today and results will be available in 24-48 hours. We will contact you if we need to arrange additional treatment based on your testing. Negative results will be on your MyChart account. Abstain from sex until you receive your final results.  Use a condom for sexual encounters. If you have any worsening or changing symptoms including abnormal discharge, pelvic pain, abdominal pain, fever, nausea, or vomiting, then you should be reevaluated.

## 2023-07-19 ENCOUNTER — Ambulatory Visit (HOSPITAL_COMMUNITY)
Admission: EM | Admit: 2023-07-19 | Discharge: 2023-07-19 | Disposition: A | Discharge status: Home / Self Care | Payer: MEDICAID | Attending: Internal Medicine | Admitting: Internal Medicine

## 2023-07-19 ENCOUNTER — Telehealth (HOSPITAL_COMMUNITY): Payer: Self-pay

## 2023-07-19 DIAGNOSIS — B9689 Other specified bacterial agents as the cause of diseases classified elsewhere: Secondary | ICD-10-CM

## 2023-07-19 DIAGNOSIS — N760 Acute vaginitis: Secondary | ICD-10-CM

## 2023-07-19 DIAGNOSIS — A549 Gonococcal infection, unspecified: Secondary | ICD-10-CM | POA: Diagnosis not present

## 2023-07-19 LAB — CERVICOVAGINAL ANCILLARY ONLY
Bacterial Vaginitis (gardnerella): POSITIVE — AB
Chlamydia: NEGATIVE
Comment: NEGATIVE
Comment: NEGATIVE
Comment: NEGATIVE
Comment: NORMAL
Neisseria Gonorrhea: POSITIVE — AB
Trichomonas: NEGATIVE

## 2023-07-19 MED ORDER — METRONIDAZOLE 500 MG PO TABS: 500.0000 mg | ORAL_TABLET | Freq: Two times a day (BID) | ORAL | 0 refills | Status: DC

## 2023-07-19 MED ORDER — CEFTRIAXONE SODIUM 1 G IJ SOLR
1.0000 g | Freq: Once | INTRAMUSCULAR | Status: AC
  Administered 2023-07-19: 1 g via INTRAMUSCULAR

## 2023-07-19 MED ORDER — CEFTRIAXONE SODIUM 1 G IJ SOLR
INTRAMUSCULAR | Status: AC
  Filled 2023-07-19: qty 10

## 2023-07-19 MED ORDER — LIDOCAINE HCL (PF) 1 % IJ SOLN
INTRAMUSCULAR | Status: AC
  Filled 2023-07-19: qty 2

## 2023-07-19 NOTE — ED Notes (Signed)
 Patient presenting for treatment of Gonorrhea. Denies any allergies to rocephin  medication

## 2023-07-19 NOTE — Telephone Encounter (Signed)
  Component Ref Range & Units (hover) 1 d ago  Neisseria Gonorrhea Positive Abnormal   Chlamydia Negative  Trichomonas Negative  Bacterial Vaginitis (gardnerella) Positive Abnormal   Comment Normal Reference Range Bacterial Vaginosis - Negative  Comment Normal Reference Ranger Chlamydia - Negative  Comment Normal Reference Range Neisseria Gonorrhea - Negative  Comment Normal Reference Range Trichomonas - Negative

## 2023-07-19 NOTE — ED Notes (Signed)
 Injection given in the right upper outer quadrant and tolerated well.

## 2023-07-20 NOTE — Telephone Encounter (Signed)
 Patient came to the clinic in person and results were addressed then. See Nurse visit encounter for 07/19/23.

## 2023-07-23 ENCOUNTER — Ambulatory Visit (HOSPITAL_COMMUNITY): Payer: Self-pay

## 2023-08-28 ENCOUNTER — Ambulatory Visit (HOSPITAL_COMMUNITY)
Admission: EM | Admit: 2023-08-28 | Discharge: 2023-08-28 | Disposition: A | Discharge status: Home / Self Care | Payer: MEDICAID | Attending: Physician Assistant | Admitting: Physician Assistant

## 2023-08-28 ENCOUNTER — Encounter (HOSPITAL_COMMUNITY): Payer: Self-pay

## 2023-08-28 DIAGNOSIS — Z113 Encounter for screening for infections with a predominantly sexual mode of transmission: Secondary | ICD-10-CM | POA: Diagnosis not present

## 2023-08-28 DIAGNOSIS — L239 Allergic contact dermatitis, unspecified cause: Secondary | ICD-10-CM | POA: Diagnosis present

## 2023-08-28 DIAGNOSIS — N760 Acute vaginitis: Secondary | ICD-10-CM | POA: Insufficient documentation

## 2023-08-28 LAB — POCT URINALYSIS DIP (MANUAL ENTRY)
Bilirubin, UA: NEGATIVE
Blood, UA: NEGATIVE
Glucose, UA: NEGATIVE mg/dL
Ketones, POC UA: NEGATIVE mg/dL
Leukocytes, UA: NEGATIVE
Nitrite, UA: NEGATIVE
Protein Ur, POC: NEGATIVE mg/dL
Spec Grav, UA: 1.015 (ref 1.010–1.025)
Urobilinogen, UA: 0.2 U/dL
pH, UA: 7 (ref 5.0–8.0)

## 2023-08-28 LAB — POCT URINE PREGNANCY: Preg Test, Ur: NEGATIVE

## 2023-08-28 LAB — HIV ANTIBODY (ROUTINE TESTING W REFLEX): HIV Screen 4th Generation wRfx: NONREACTIVE

## 2023-08-28 MED ORDER — TRIAMCINOLONE ACETONIDE 0.1 % EX CREA: 1.0000 | TOPICAL_CREAM | Freq: Two times a day (BID) | CUTANEOUS | 0 refills | Status: AC

## 2023-08-28 MED ORDER — FLUCONAZOLE 150 MG PO TABS: 150.0000 mg | ORAL_TABLET | Freq: Every day | ORAL | 0 refills | Status: AC

## 2023-08-28 MED ORDER — METRONIDAZOLE 500 MG PO TABS: 500.0000 mg | ORAL_TABLET | Freq: Two times a day (BID) | ORAL | 0 refills | Status: AC

## 2023-08-28 NOTE — ED Provider Notes (Signed)
 [facility]    CSN: [id number] Arrival date & time: 08/28/23  1945      History   Chief Complaint Chief Complaint  Patient presents with   Exposure to STD    HPI Jacqueline Shepherd is a 33 y.o. female.   Patient presents today with several concerns.  Her primary concern today is a request for STI testing.  She reports that over the past several days she has had some vaginal odor and discharge as well as irritation.  She has a history of recurrent BV with similar presentation and so is requesting appropriate treatment.  She was last treated approximately 6 weeks ago and had resolution of symptoms until recurrence recently.  At that time she was positive for BV and gonorrhea but completed course of treatment.  She denies any changes to personal hygiene products including soaps or detergents.  She does not have a history of diabetes and denies any use of SGLT2 inhibitor.  [name] requesting a Diflucan  as she often gets yeast infection following metronidazole .  In addition, she reports a several day history of pruritic lesion at the base of her left thumb.  She has applied calamine lotion without improvement of symptoms.  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  She denies history of dermatological condition including eczema or psoriasis.  Denies any fever, nausea, vomiting.  The rash has not spread or changed since onset.    Past Medical History:  Diagnosis Date   Abdominal pain    Cannabinoid hyperemesis syndrome    Chlamydia infection complicating pregnancy in second trimester 08/18/2018   Negative test of cure on 08/06/18   Transient hypertension of pregnancy 10/12/2019   Weight loss 10/12/2019    Patient Active Problem List   Diagnosis Date Noted   Intractable nausea and vomiting 04/17/2022   Hypokalemia 04/17/2022   Cannabis hyperemesis syndrome  concurrent with and due to cannabis abuse (HCC) 06/04/2020   IUD (intrauterine device) in place 05/15/2020   Fetal malpresentation 05/14/2020   Preeclampsia 05/10/2020   Antepartum mild preeclampsia 05/07/2020   Carpal tunnel syndrome during pregnancy 04/15/2020   Gestational diabetes mellitus (GDM), antepartum 04/13/2020   Dysuria during pregnancy in second trimester 02/23/2020   Abnormal TSH 12/09/2019   Hyperemesis gravidarum, antepartum 11/22/2019   Tobacco use in pregnancy, antepartum, first trimester 11/11/2019   Other social stressor 11/11/2019   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   Obesity in pregnancy 10/12/2019   BMI 40.0-44.9, adult (HCC) 10/12/2019   Cannabinoid hyperemesis syndrome    Chlamydia infection affecting pregnancy in second trimester 08/18/2018   Rubella non-immune status, antepartum 08/18/2018    Past Surgical History:  Procedure Laterality Date   CESAREAN SECTION N/A 05/14/2020   Procedure: CESAREAN SECTION;  Surgeon: Nicholaus Burnard CHRISTELLA, MD;  Location: [facility];  Service: Obstetrics;  Laterality: N/A;  Primary C/S malpresentation & IUD placement   WISDOM TOOTH EXTRACTION      OB History     Gravida  2   Para  1   Term  1   Preterm      AB  1   Living  1      SAB  1   IAB      Ectopic      Multiple  0   Live Births  1  Home Medications    Prior to Admission medications   Medication Sig Start Date End Date Taking? Authorizing Provider  fluconazole  (DIFLUCAN ) 150 MG tablet Take 1 tablet (150 mg total) by mouth daily. 08/28/23  Yes Carriann Hesse K, PA-C  triamcinolone  cream (KENALOG ) 0.1 % Apply 1 Application topically 2 (two) times daily. 08/28/23  Yes Yecenia Dalgleish K, PA-C  metroNIDAZOLE  (FLAGYL ) 500 MG tablet Take 1 tablet (500 mg total) by mouth 2 (two) times daily. 08/28/23   Shadonna Benedick, Rocky POUR, PA-C    Family History Family History  Problem Relation Age of Onset   Diabetes Mother    Hypertension Mother      Social History Social History   Tobacco Use   Smoking status: Former    Current packs/day: 0.00    Types: Cigarettes    Quit date: 01/22/2020    Years since quitting: 3.6   Smokeless tobacco: Never  Vaping Use   Vaping status: Never Used  Substance Use Topics   Alcohol use: Yes   Drug use: Not Currently    Types: Marijuana    Comment: 05/06/20     Allergies   Ibuprofen    Review of Systems Review of Systems  Constitutional:  Negative for activity change, appetite change, fatigue and fever.  Gastrointestinal:  Negative for abdominal pain, diarrhea, nausea and vomiting.  Genitourinary:  Positive for vaginal discharge and vaginal pain. Negative for dysuria, frequency, urgency and vaginal bleeding.  Musculoskeletal:  Negative for arthralgias and myalgias.  Skin:  Positive for rash.     Physical Exam Triage Vital Signs ED Triage Vitals [08/28/23 1957]  Encounter Vitals Group     BP (!) 144/90     Girls Systolic BP Percentile      Girls Diastolic BP Percentile      Boys Systolic BP Percentile      Boys Diastolic BP Percentile      Pulse Rate 80     Resp 20     Temp 98.2 F (36.8 C)     Temp Source Oral     SpO2 99 %     Weight      Height      Head Circumference      Peak Flow      Pain Score      Pain Loc      Pain Education      Exclude from Growth Chart    No data found.  Updated Vital Signs BP (!) 144/90 (BP Location: Left Arm)   Pulse 80   Temp 98.2 F (36.8 C) (Oral)   Resp 20   LMP 08/13/2023 (Approximate)   SpO2 99%   Visual Acuity Right Eye Distance:   Left Eye Distance:   Bilateral Distance:    Right Eye Near:   Left Eye Near:    Bilateral Near:     Physical Exam Vitals reviewed.  Constitutional:      General: [name] awake. [name] not in acute distress.    Appearance: Normal appearance. [name] well-developed. [name] not ill-appearing.     Comments: Very pleasant female appears stated age in no acute distress sitting  comfortably in exam room  HENT:     Head: Normocephalic and atraumatic.  Cardiovascular:     Rate and Rhythm: Normal rate and regular rhythm.     Heart sounds: Normal heart sounds, S1 normal and S2 normal. No murmur heard. Pulmonary:     Effort: Pulmonary effort is normal.  Breath sounds: Normal breath sounds. No wheezing, rhonchi or rales.     Comments: Clear to auscultation bilaterally Abdominal:     Palpations: Abdomen is soft.     Tenderness: There is no abdominal tenderness. There is no right CVA tenderness, left CVA tenderness, guarding or rebound.  Skin:    Comments: Vesicular rash with some excoriation noted base of first left MCP joint.  No additional lesions noted.  No surrounding erythema or edema.  Psychiatric:        Behavior: Behavior is cooperative.      UC Treatments / Results  Labs (all labs ordered are listed, but only abnormal results are displayed) Labs Reviewed  HIV ANTIBODY (ROUTINE TESTING W REFLEX)  RPR  POCT URINALYSIS DIP (MANUAL ENTRY)  POCT URINE PREGNANCY  CERVICOVAGINAL ANCILLARY ONLY    EKG   Radiology No results found.  Procedures Procedures (including critical care time)  Medications Ordered in UC Medications - No data to display  Initial Impression / Assessment and Plan / UC Course  I have reviewed the triage vital signs and the nursing notes.  Pertinent labs & imaging results that were available during my care of the patient were reviewed by me and considered in my medical decision making (see chart for details).     Patient is well-appearing, afebrile, nontoxic, nontachycardic.  Vital signs and physical exam are reassuring with no indication for emergent evaluation or imaging.  Will empirically treat for BV given her ongoing vaginal odor.  We discussed that she is not to drink any alcohol with this medication for 3 days after completing course to prevent Antabuse side effects.  No indication for dose adjustment based on  metabolic panel from 08/01/2022 with creatinine of 0.89 and calculated creatinine clearance of 225 mL/min.  She was given a dose of Diflucan  to use as needed with yeast infection symptoms.  Will contact her if we need to arrange any additional treatment based on her testing results.  We discussed that if anything worsens and she develops pelvic pain, abdominal pain, fever, nausea, vomiting she is to be seen immediately.  Suspect contact dermatitis versus herpetic whitlow.  Will cover with triamcinolone  to help manage symptoms but defer antiviral therapy as I have a low suspicion for herpetic whitlow and she is already been symptomatic for several days.  She was encouraged to keep the area clean to avoid secondary infection.  We discussed that if anything changes or worsens that she needs to be seen immediately including spread of rash, fever, nausea, vomiting.  Final Clinical Impressions(s) / UC Diagnoses   Final diagnoses:  Acute vaginitis  Screening examination for STI  Allergic contact dermatitis, unspecified trigger     Discharge Instructions      You are treating you for BV.  Start metronidazole  twice daily for 7 days.  Avoid alcohol while on this medication and for 3 days after completing course as will cause you to vomit.  Take Diflucan  if you develop any yeast infection symptoms.  We will contact you if we need to arrange any additional treatment.  Wear loosefitting cotton or use hypotonic soaps and detergents.  If develop any pelvic pain, double pain, fever, nausea, vomiting you need to be seen immediately.  Keep the rash clean with hypoallergenic soaps and detergents.  Apply triamcinolone  twice daily to help with itching.  If you develop any worsening rash, pain, fever, nausea, vomiting you need to be seen immediately.    ED Prescriptions  Medication Sig Dispense Auth. Provider   metroNIDAZOLE  (FLAGYL ) 500 MG tablet Take 1 tablet (500 mg total) by mouth 2 (two) times daily. 14  tablet Karyl Sharrar K, PA-C   fluconazole  (DIFLUCAN ) 150 MG tablet Take 1 tablet (150 mg total) by mouth daily. 1 tablet Aarush Stukey K, PA-C   triamcinolone  cream (KENALOG ) 0.1 % Apply 1 Application topically 2 (two) times daily. 30 g Almeter Westhoff K, PA-C      PDMP not reviewed this encounter.   Sherrell Rocky POUR, PA-C 08/28/23 2111

## 2023-08-28 NOTE — Discharge Instructions (Addendum)
 You are treating you for BV.  Start metronidazole  twice daily for 7 days.  Avoid alcohol while on this medication and for 3 days after completing course as will cause you to vomit.  Take Diflucan  if you develop any yeast infection symptoms.  We will contact you if we need to arrange any additional treatment.  Wear loosefitting cotton or use hypotonic soaps and detergents.  If develop any pelvic pain, double pain, fever, nausea, vomiting you need to be seen immediately.  Keep the rash clean with hypoallergenic soaps and detergents.  Apply triamcinolone  twice daily to help with itching.  If you develop any worsening rash, pain, fever, nausea, vomiting you need to be seen immediately.

## 2023-08-28 NOTE — ED Triage Notes (Signed)
 Patient has a new partner and would like STD testing with blood work. Denies any symptoms.  Patient states she has a cluster of bumps on her right thumb that appeared 3-days ago.

## 2023-08-28 NOTE — ED Notes (Signed)
 Patient is here for STI-testing and blood work. Patient has a new partner. Denies any symptoms.

## 2023-08-29 ENCOUNTER — Ambulatory Visit (HOSPITAL_COMMUNITY): Payer: Self-pay

## 2023-08-29 LAB — CERVICOVAGINAL ANCILLARY ONLY
Bacterial Vaginitis (gardnerella): POSITIVE — AB
Candida Glabrata: NEGATIVE
Candida Vaginitis: NEGATIVE
Chlamydia: NEGATIVE
Comment: NEGATIVE
Comment: NEGATIVE
Comment: NEGATIVE
Comment: NEGATIVE
Comment: NEGATIVE
Comment: NORMAL
Neisseria Gonorrhea: NEGATIVE
Trichomonas: NEGATIVE

## 2023-08-29 LAB — RPR: RPR Ser Ql: NONREACTIVE

## 2023-10-15 ENCOUNTER — Other Ambulatory Visit (HOSPITAL_COMMUNITY): Payer: Self-pay

## 2023-11-02 ENCOUNTER — Ambulatory Visit (HOSPITAL_COMMUNITY)
Admission: EM | Admit: 2023-11-02 | Discharge: 2023-11-02 | Disposition: A | Discharge status: Home / Self Care | Payer: MEDICAID

## 2023-11-02 ENCOUNTER — Encounter (HOSPITAL_COMMUNITY): Payer: Self-pay

## 2023-11-02 DIAGNOSIS — Z113 Encounter for screening for infections with a predominantly sexual mode of transmission: Secondary | ICD-10-CM | POA: Insufficient documentation

## 2023-11-02 DIAGNOSIS — Z3202 Encounter for pregnancy test, result negative: Secondary | ICD-10-CM | POA: Diagnosis present

## 2023-11-02 LAB — HIV ANTIBODY (ROUTINE TESTING W REFLEX): HIV Screen 4th Generation wRfx: NONREACTIVE

## 2023-11-02 LAB — POCT URINE PREGNANCY: Preg Test, Ur: NEGATIVE

## 2023-11-02 NOTE — Discharge Instructions (Addendum)
  1. Screening for STD (sexually transmitted disease) (Primary) - POCT urine pregnancy completed UC is negative for pregnancy - Cervicovaginal swab collected in UC and sent to lab for further testing results should be available in 2 to 3 days. - STD blood testing completed in UC and sent to lab, results should be available in 1 to 2 days. - If any abnormal findings with final report patient will be contacted appropriate treatment provided - Continue to monitor for any symptoms if you develop any symptoms of STD or any other concerning symptoms follow-up in ER for further evaluation and management.

## 2023-11-02 NOTE — ED Provider Notes (Signed)
 [facility] [place]  Note:  This document was prepared using Conservation officer, historic buildings and may include unintentional dictation errors.  MRN: [id number] DOB: 17-Aug-1990  Subjective:   Jacqueline Shepherd is a 33 y.o. female presenting for STD screening and pregnancy testing today in urgent care.  Patient denies any current symptoms, no known exposure but states she has a new sex partner and would like to have testing and pregnancy screening.  Patient denies any dysuria, urinary frequency, vaginal discharge, vaginal lesion, pain in the abdomen, flank or vaginal areas.  No current facility-administered medications for this encounter.  Current Outpatient Medications:    fluconazole  (DIFLUCAN ) 150 MG tablet, Take 1 tablet (150 mg total) by mouth daily. (Patient not taking: Reported on 11/02/2023), Disp: 1 tablet, Rfl: 0   metroNIDAZOLE  (FLAGYL ) 500 MG tablet, Take 1 tablet (500 mg total) by mouth 2 (two) times daily., Disp: 14 tablet, Rfl: 0   triamcinolone  cream (KENALOG ) 0.1 %, Apply 1 Application topically 2 (two) times daily. (Patient not taking: Reported on 11/02/2023), Disp: 30 g, Rfl: 0   Allergies  Allergen Reactions   Ibuprofen  Anaphylaxis    Past Medical History:  Diagnosis Date   Abdominal pain    Cannabinoid hyperemesis syndrome    Chlamydia infection complicating pregnancy in second trimester 08/18/2018   Negative test of cure on 08/06/18   Transient hypertension of pregnancy 10/12/2019   Weight loss 10/12/2019     Past Surgical History:  Procedure Laterality Date   CESAREAN SECTION N/A 05/14/2020   Procedure: CESAREAN SECTION;  Surgeon: Nicholaus Burnard CHRISTELLA, MD;  Location: [facility];  Service: Obstetrics;  Laterality: N/A;  Primary C/S malpresentation & IUD placement   WISDOM TOOTH EXTRACTION      Family History  Problem Relation Age of Onset   Diabetes Mother    Hypertension Mother     Social History   Tobacco Use   Smoking status: Former    Current  packs/day: 0.00    Types: Cigarettes    Quit date: 01/22/2020    Years since quitting: 3.7   Smokeless tobacco: Never  Vaping Use   Vaping status: Never Used  Substance Use Topics   Alcohol use: Yes   Drug use: Not Currently    Types: Marijuana    Comment: 05/06/20    ROS Refer to HPI for ROS details.  Objective:    Vitals: BP 102/71 (BP Location: Right Arm)   Pulse 79   Temp 98.4 F (36.9 C) (Oral)   Resp 16   LMP 10/28/2023 (Exact Date)   SpO2 99%   Physical Exam Vitals and nursing note reviewed.  Constitutional:      General: [name] not in acute distress.    Appearance: [name] well-developed. [name] not ill-appearing or toxic-appearing.  HENT:     Head: Normocephalic and atraumatic.     Mouth/Throat:     Mouth: Mucous membranes are moist.  Cardiovascular:     Rate and Rhythm: Normal rate.  Pulmonary:     Effort: Pulmonary effort is normal. No respiratory distress.  Abdominal:     Palpations: Abdomen is soft.     Tenderness: There is no abdominal tenderness. There is no right CVA tenderness or left CVA tenderness.  Skin:    General: Skin is warm and dry.  Neurological:     General: No focal deficit present.     Mental Status: [name] alert and oriented to person, place, and time.  Psychiatric:  Mood and Affect: Mood normal.        Behavior: Behavior normal.     Procedures  Results for orders placed or performed during the hospital encounter of 11/02/23 (from the past 24 hours)  POCT urine pregnancy     Status: None   Collection Time: 11/02/23  8:08 PM  Result Value Ref Range   Preg Test, Ur Negative Negative    Assessment and Plan :     Discharge Instructions       1. Screening for STD (sexually transmitted disease) (Primary) - POCT urine pregnancy completed UC is negative for pregnancy - Cervicovaginal swab collected in UC and sent to lab for further testing results should be available in 2 to 3 days. - STD blood testing completed in  UC and sent to lab, results should be available in 1 to 2 days. - If any abnormal findings with final report patient will be contacted appropriate treatment provided - Continue to monitor for any symptoms if you develop any symptoms of STD or any other concerning symptoms follow-up in ER for further evaluation and management.       Olof Marcil B Rayvin Abid   Viggo Perko, [place] B, [place] 11/02/23 2013

## 2023-11-02 NOTE — ED Triage Notes (Signed)
 Patient is requesting STD testing and pregnancy testing. Patient states she has a new partner .  Patiaent deneis any symptoms.

## 2023-11-03 LAB — RPR: RPR Ser Ql: NONREACTIVE

## 2023-11-05 ENCOUNTER — Ambulatory Visit (HOSPITAL_COMMUNITY): Payer: Self-pay

## 2023-11-05 LAB — CERVICOVAGINAL ANCILLARY ONLY
Bacterial Vaginitis (gardnerella): POSITIVE — AB
Candida Glabrata: NEGATIVE
Candida Vaginitis: NEGATIVE
Chlamydia: NEGATIVE
Comment: NEGATIVE
Comment: NEGATIVE
Comment: NEGATIVE
Comment: NEGATIVE
Comment: NEGATIVE
Comment: NORMAL
Neisseria Gonorrhea: NEGATIVE
Trichomonas: NEGATIVE

## 2023-11-05 MED ORDER — METRONIDAZOLE 0.75 % VA GEL: 1.0000 | Freq: Every day | VAGINAL | 0 refills | Status: AC
